# Patient Record
Sex: Male | Born: 1945 | Race: White | Hispanic: No | Marital: Married | Smoking: Former smoker
Health system: Southern US, Community
[De-identification: ages and names within clinical notes are randomized; demographics above are authoritative.]

## PROBLEM LIST (undated history)

## (undated) DIAGNOSIS — N189 Chronic kidney disease, unspecified: Secondary | ICD-10-CM

## (undated) DIAGNOSIS — E039 Hypothyroidism, unspecified: Secondary | ICD-10-CM

## (undated) DIAGNOSIS — N200 Calculus of kidney: Secondary | ICD-10-CM

## (undated) DIAGNOSIS — K644 Residual hemorrhoidal skin tags: Secondary | ICD-10-CM

## (undated) DIAGNOSIS — M549 Dorsalgia, unspecified: Secondary | ICD-10-CM

## (undated) DIAGNOSIS — E119 Type 2 diabetes mellitus without complications: Secondary | ICD-10-CM

## (undated) DIAGNOSIS — G8929 Other chronic pain: Secondary | ICD-10-CM

## (undated) DIAGNOSIS — I6529 Occlusion and stenosis of unspecified carotid artery: Secondary | ICD-10-CM

## (undated) DIAGNOSIS — E7800 Pure hypercholesterolemia, unspecified: Secondary | ICD-10-CM

## (undated) DIAGNOSIS — D126 Benign neoplasm of colon, unspecified: Secondary | ICD-10-CM

## (undated) DIAGNOSIS — I10 Essential (primary) hypertension: Secondary | ICD-10-CM

## (undated) DIAGNOSIS — F4310 Post-traumatic stress disorder, unspecified: Secondary | ICD-10-CM

## (undated) DIAGNOSIS — T7840XA Allergy, unspecified, initial encounter: Secondary | ICD-10-CM

## (undated) DIAGNOSIS — K219 Gastro-esophageal reflux disease without esophagitis: Secondary | ICD-10-CM

## (undated) DIAGNOSIS — M5136 Other intervertebral disc degeneration, lumbar region: Secondary | ICD-10-CM

## (undated) DIAGNOSIS — R000 Tachycardia, unspecified: Secondary | ICD-10-CM

## (undated) DIAGNOSIS — M51369 Other intervertebral disc degeneration, lumbar region without mention of lumbar back pain or lower extremity pain: Secondary | ICD-10-CM

## (undated) DIAGNOSIS — H8109 Meniere's disease, unspecified ear: Secondary | ICD-10-CM

## (undated) DIAGNOSIS — K635 Polyp of colon: Secondary | ICD-10-CM

## (undated) HISTORY — DX: Residual hemorrhoidal skin tags: K64.4

## (undated) HISTORY — DX: Tachycardia, unspecified: R00.0

## (undated) HISTORY — DX: Allergy, unspecified, initial encounter: T78.40XA

## (undated) HISTORY — DX: Polyp of colon: K63.5

## (undated) HISTORY — DX: Pure hypercholesterolemia, unspecified: E78.00

## (undated) HISTORY — DX: Benign neoplasm of colon, unspecified: D12.6

## (undated) HISTORY — PX: CHOLECYSTECTOMY: SHX55

## (undated) HISTORY — DX: Meniere's disease, unspecified ear: H81.09

## (undated) HISTORY — DX: Type 2 diabetes mellitus without complications: E11.9

## (undated) HISTORY — DX: Essential (primary) hypertension: I10

## (undated) HISTORY — PX: MOUTH SURGERY: SHX715

---

## 1963-01-23 DIAGNOSIS — J189 Pneumonia, unspecified organism: Secondary | ICD-10-CM

## 1963-01-23 HISTORY — DX: Pneumonia, unspecified organism: J18.9

## 1972-01-23 DIAGNOSIS — I499 Cardiac arrhythmia, unspecified: Secondary | ICD-10-CM | POA: Insufficient documentation

## 1998-01-22 DIAGNOSIS — H9190 Unspecified hearing loss, unspecified ear: Secondary | ICD-10-CM | POA: Insufficient documentation

## 2002-10-14 ENCOUNTER — Encounter: Admission: RE | Admit: 2002-10-14 | Discharge: 2002-10-14 | Payer: Self-pay | Admitting: *Deleted

## 2002-10-14 ENCOUNTER — Encounter: Payer: Self-pay | Admitting: *Deleted

## 2003-11-16 ENCOUNTER — Ambulatory Visit (HOSPITAL_COMMUNITY): Admission: RE | Admit: 2003-11-16 | Discharge: 2003-11-16 | Payer: Self-pay | Admitting: Internal Medicine

## 2003-11-16 HISTORY — PX: COLONOSCOPY: SHX174

## 2004-03-01 ENCOUNTER — Ambulatory Visit: Payer: Self-pay | Admitting: Orthopedic Surgery

## 2004-03-06 ENCOUNTER — Ambulatory Visit (HOSPITAL_COMMUNITY): Admission: RE | Admit: 2004-03-06 | Discharge: 2004-03-06 | Payer: Self-pay | Admitting: Orthopedic Surgery

## 2004-03-15 ENCOUNTER — Ambulatory Visit: Payer: Self-pay | Admitting: Orthopedic Surgery

## 2008-11-02 ENCOUNTER — Encounter (INDEPENDENT_AMBULATORY_CARE_PROVIDER_SITE_OTHER): Payer: Self-pay | Admitting: *Deleted

## 2008-11-22 DIAGNOSIS — K635 Polyp of colon: Secondary | ICD-10-CM

## 2008-11-22 DIAGNOSIS — D126 Benign neoplasm of colon, unspecified: Secondary | ICD-10-CM

## 2008-11-22 HISTORY — DX: Polyp of colon: K63.5

## 2008-11-22 HISTORY — DX: Benign neoplasm of colon, unspecified: D12.6

## 2008-11-24 ENCOUNTER — Encounter: Payer: Self-pay | Admitting: Internal Medicine

## 2008-12-10 ENCOUNTER — Ambulatory Visit: Payer: Self-pay | Admitting: Internal Medicine

## 2008-12-10 ENCOUNTER — Ambulatory Visit (HOSPITAL_COMMUNITY): Admission: RE | Admit: 2008-12-10 | Discharge: 2008-12-10 | Payer: Self-pay | Admitting: Internal Medicine

## 2008-12-10 HISTORY — PX: COLONOSCOPY: SHX174

## 2008-12-21 ENCOUNTER — Encounter: Payer: Self-pay | Admitting: Internal Medicine

## 2010-03-26 ENCOUNTER — Emergency Department (HOSPITAL_COMMUNITY): Payer: Medicare Other

## 2010-03-26 ENCOUNTER — Emergency Department (HOSPITAL_COMMUNITY)
Admission: EM | Admit: 2010-03-26 | Discharge: 2010-03-26 | Disposition: A | Discharge status: Home / Self Care | Payer: Medicare Other | Attending: Emergency Medicine | Admitting: Emergency Medicine

## 2010-03-26 DIAGNOSIS — R002 Palpitations: Secondary | ICD-10-CM | POA: Insufficient documentation

## 2010-03-26 DIAGNOSIS — R05 Cough: Secondary | ICD-10-CM | POA: Insufficient documentation

## 2010-03-26 DIAGNOSIS — E7800 Pure hypercholesterolemia, unspecified: Secondary | ICD-10-CM | POA: Insufficient documentation

## 2010-03-26 DIAGNOSIS — R1011 Right upper quadrant pain: Secondary | ICD-10-CM | POA: Insufficient documentation

## 2010-03-26 DIAGNOSIS — I10 Essential (primary) hypertension: Secondary | ICD-10-CM | POA: Insufficient documentation

## 2010-03-26 DIAGNOSIS — R0602 Shortness of breath: Secondary | ICD-10-CM | POA: Insufficient documentation

## 2010-03-26 DIAGNOSIS — R059 Cough, unspecified: Secondary | ICD-10-CM | POA: Insufficient documentation

## 2010-03-26 DIAGNOSIS — Z79899 Other long term (current) drug therapy: Secondary | ICD-10-CM | POA: Insufficient documentation

## 2010-03-26 LAB — COMPREHENSIVE METABOLIC PANEL
Albumin: 3.9 g/dL (ref 3.5–5.2)
BUN: 14 mg/dL (ref 6–23)
Calcium: 9.3 mg/dL (ref 8.4–10.5)
Creatinine, Ser: 1.18 mg/dL (ref 0.4–1.5)
Total Bilirubin: 1.4 mg/dL — ABNORMAL HIGH (ref 0.3–1.2)
Total Protein: 7.5 g/dL (ref 6.0–8.3)

## 2010-03-26 LAB — DIFFERENTIAL
Basophils Relative: 0 % (ref 0–1)
Eosinophils Absolute: 0.2 10*3/uL (ref 0.0–0.7)
Eosinophils Relative: 2 % (ref 0–5)
Lymphs Abs: 1.6 10*3/uL (ref 0.7–4.0)
Monocytes Relative: 9 % (ref 3–12)

## 2010-03-26 LAB — POCT CARDIAC MARKERS
CKMB, poc: 2.6 ng/mL (ref 1.0–8.0)
Myoglobin, poc: 190 ng/mL (ref 12–200)
Troponin i, poc: <0.05 ng/mL (ref 0.00–0.09)

## 2010-03-26 LAB — CBC
MCH: 30.2 pg (ref 26.0–34.0)
MCHC: 35.6 g/dL (ref 30.0–36.0)
MCV: 84.8 fL (ref 78.0–100.0)
Platelets: 252 10*3/uL (ref 150–400)
RDW: 12.7 % (ref 11.5–15.5)

## 2010-03-28 ENCOUNTER — Other Ambulatory Visit (HOSPITAL_COMMUNITY): Payer: Self-pay | Admitting: Family Medicine

## 2010-03-28 ENCOUNTER — Ambulatory Visit (HOSPITAL_COMMUNITY)
Admission: RE | Admit: 2010-03-28 | Discharge: 2010-03-28 | Disposition: A | Discharge status: Home / Self Care | Payer: Medicare Other | Source: Ambulatory Visit | Attending: Family Medicine | Admitting: Family Medicine

## 2010-03-28 DIAGNOSIS — R1011 Right upper quadrant pain: Secondary | ICD-10-CM | POA: Insufficient documentation

## 2010-03-28 DIAGNOSIS — K8020 Calculus of gallbladder without cholecystitis without obstruction: Secondary | ICD-10-CM | POA: Insufficient documentation

## 2010-03-28 DIAGNOSIS — K7689 Other specified diseases of liver: Secondary | ICD-10-CM | POA: Insufficient documentation

## 2010-03-30 ENCOUNTER — Encounter (HOSPITAL_COMMUNITY): Payer: Medicare Other

## 2010-03-30 ENCOUNTER — Other Ambulatory Visit: Payer: Self-pay | Admitting: General Surgery

## 2010-03-30 LAB — SURGICAL PCR SCREEN: Staphylococcus aureus: NEGATIVE

## 2010-03-31 ENCOUNTER — Ambulatory Visit (HOSPITAL_COMMUNITY)
Admission: RE | Admit: 2010-03-31 | Discharge: 2010-03-31 | Disposition: A | Discharge status: Home / Self Care | Payer: Medicare Other | Source: Ambulatory Visit | Attending: General Surgery | Admitting: General Surgery

## 2010-03-31 ENCOUNTER — Other Ambulatory Visit: Payer: Self-pay | Admitting: General Surgery

## 2010-03-31 DIAGNOSIS — Z01812 Encounter for preprocedural laboratory examination: Secondary | ICD-10-CM | POA: Insufficient documentation

## 2010-03-31 DIAGNOSIS — Z79899 Other long term (current) drug therapy: Secondary | ICD-10-CM | POA: Insufficient documentation

## 2010-03-31 DIAGNOSIS — I10 Essential (primary) hypertension: Secondary | ICD-10-CM | POA: Insufficient documentation

## 2010-03-31 DIAGNOSIS — Z01818 Encounter for other preprocedural examination: Secondary | ICD-10-CM | POA: Insufficient documentation

## 2010-03-31 DIAGNOSIS — Z7982 Long term (current) use of aspirin: Secondary | ICD-10-CM | POA: Insufficient documentation

## 2010-03-31 DIAGNOSIS — K8010 Calculus of gallbladder with chronic cholecystitis without obstruction: Secondary | ICD-10-CM | POA: Insufficient documentation

## 2010-04-02 NOTE — Op Note (Signed)
Kyle Dunlap, Kyle Dunlap            ACCOUNT NO.:  [id number]  MEDICAL RECORD NO.:  [id number]           PATIENT TYPE:  O  LOCATION:  DAYP                          FACILITY:  [facility]  PHYSICIAN:  Jamesetta So, M.D.  DATE OF BIRTH:  Jul 13, 1945  DATE OF PROCEDURE:  03/31/2010 DATE OF DISCHARGE:                              OPERATIVE REPORT   PREOPERATIVE DIAGNOSES:  Cholecystitis, cholelithiasis.  POSTOPERATIVE DIAGNOSES:  Cholecystitis, cholelithiasis.  PROCEDURE:  Laparoscopic cholecystectomy.  SURGEON:  Jamesetta So, MD  ANESTHESIA:  General endotracheal.  INDICATIONS:  The patient is a 65 year old white male who presents with cholecystitis secondary to cholelithiasis.  The risks and benefits of the procedure including bleeding, infection, hepatobiliary injury, and the possibility an open procedure were fully explained to the patient, I gave informed consent.  PROCEDURE NOTE:  The patient was placed in the supine position.  After induction of general endotracheal anesthesia, the abdomen was prepped and draped using the usual sterile technique with DuraPrep.  Surgical site confirmation was performed.  A supraumbilical incision was made down to the fascia.  A Veress needle was introduced into the abdominal cavity and confirmation of placement was done using the saline drop test.  The abdomen was then insufflated to 16 mmHg pressure.  An 11-mm trocar was introduced into the abdominal cavity under direct visualization without difficulty.  The patient was placed in reversed Trendelenburg position.  An additional 11-mm trocar was placed in the epigastric region and 5-mm trocar was placed in the right upper quadrant, right flank regions.  Liver was inspected and noted to be within normal limits.  The gallbladder was retracted superiorly and laterally.  The dissection was begun around the infundibulum of the gallbladder.  The cystic duct was first identified. The juncture to  the infundibulum was fully identified.  EndoClip was placed proximally and distally on the cystic duct, and the cystic duct was divided.  This likewise to the cystic artery.  The gallbladder was then freed away from the gallbladder fossa using Bovie electrocautery. The gallbladder was delivered through the epigastric trocar site using EndoCatch bag.  Gallbladder fossa was inspected.  No abnormal bleeding or bile leakage was noted.  Surgicel was placed in the gallbladder fossa.  All fluid and air were then evacuated from the abdominal cavity prior to removal of the trocars.  All wounds were irrigated with normal saline.  All wounds were injected with 0.5% Sensorcaine.  The epigastric fascia as well as supraumbilical fascia were reapproximated using 0 Vicryl interrupted sutures.  All skin incisions were closed using staples.  Betadine ointment and dry sterile dressings were applied.  All tape and needle counts were correct at the end of the procedure. The patient was extubated in the operating room and went back to recovery room awake in stable condition.  Complications none.  SPECIMEN:  Gallbladder.  BLOOD LOSS:  Minimal.     Jamesetta So, M.D.     MAJ/MEDQ  D:  03/31/2010  T:  03/31/2010  Job:  [id number]  cc:   Margaretmary Eddy, M.D. [id number]  Electronically Signed by Aviva Signs M.D. on 04/02/2010  09:21:57 PM

## 2010-04-04 NOTE — H&P (Addendum)
  Kyle Dunlap, REISMAN            ACCOUNT NO.:  [id number]  MEDICAL RECORD NO.:  [id number]           PATIENT TYPE:  O  LOCATION:  DOIB                          FACILITY:  [facility]  PHYSICIAN:  Jamesetta So, M.D.  DATE OF BIRTH:  1945/12/08  DATE OF ADMISSION:  03/30/2010 DATE OF DISCHARGE:  LH                             HISTORY & PHYSICAL   CHIEF COMPLAINT:  Cholecystitis, cholelithiasis.  HISTORY OF PRESENT ILLNESS:  The patient is a 65 year old white male who is referred for evaluation and treatment of biliary colic secondary to cholelithiasis.  He had an episode of right upper quadrant abdominal pain with radiation to the flank, nausea, and indigestion last week and requiring an emergency room visit.  It is made worse with fatty foods. No fever, chills, jaundice have been noted.  PAST MEDICAL HISTORY:  Includes hypertension, high cholesterol levels.  PAST SURGICAL HISTORY:  Oral surgery in the remote past.  CURRENT MEDICATIONS: 1. Diltiazem 240 mg p.o. daily. 2. Triamterene/hydrochlorothiazide 37.5/25 mg p.o. daily. 3. Simvastatin 10 mg p.o. daily. 4. Diazepam 2 mg p.o. p.r.n. 5. Pepcid over-the-counter. 6. Baby aspirin which he is holding. 7. Multivitamin.  ALLERGIES:  CODEINE.  REVIEW OF SYSTEMS:  The patient denies drinking or smoking.  He denies any other cardiopulmonary difficulties or bleeding disorders.  FAMILY MEDICAL HISTORY:  Noncontributory.  PHYSICAL EXAMINATION:  GENERAL:  The patient is a well-developed, well- nourished white male in no acute distress. HEENT:  No scleral icterus. LUNGS:  Clear to auscultation with equal breath sounds bilaterally. HEART:  Regular rate and rhythm without S3, S4, or murmurs. ABDOMEN:  Soft and nondistended.  He is tender in the right upper quadrant to palpation.  No hepatosplenomegaly, masses, hernias are identified.  DIAGNOSTIC STUDIES:  Ultrasound of the gallbladder reveals cholelithiasis with a normal common bile  duct.  IMPRESSION:  Cholecystitis, cholelithiasis.  PLAN:  The patient is scheduled for laparoscopic cholecystectomy on March 31, 2010.  The risks and benefits of the procedure including bleeding, infection, hepatobiliary injury and the possibility of an open procedure were fully explained to the patient, gave informed consent.     Jamesetta So, M.D.     MAJ/MEDQ  D:  03/30/2010  T:  03/30/2010  Job:  [id number]  cc:   [facility] at [facility], M.D. [id number]  Electronically Signed by Aviva Signs M.D. on 03/30/2010 09:53:57 PM

## 2010-06-09 NOTE — Op Note (Signed)
Kyle Dunlap, ROYES            ACCOUNT NO.:  [id number]   MEDICAL RECORD NO.:  [id number]          PATIENT TYPE:  AMB   LOCATION:  DAY                           FACILITY:  [facility]   PHYSICIAN:  Hildred Laser, M.D.    DATE OF BIRTH:  1945-03-16   DATE OF PROCEDURE:  11/16/2003  DATE OF DISCHARGE:                                 OPERATIVE REPORT   PROCEDURE:  Total colonoscopy with polypectomy.   INDICATIONS:  Abbe Amsterdam is a 65 year old Caucasian male who is here for screening  colonoscopy.  While he is free of GI symptoms, he has had invasive carcinoma  and a polyp.  He has been treated and is doing fine.  The procedure risks  were reviewed with the patient and informed consent was obtained.   PREMEDICATION:  Demerol 50 mg IV, Versed 6 mg IV.   FINDINGS:  Procedure performed in endoscopy suite.  The patient's vital  signs and O2 saturation were monitored during the procedure and remained  stable.  The patient was placed in the left lateral recumbent position and  rectal examination performed.  He had soft sentinel skin tags.  Digital exam  was normal.  The Olympus video scope was placed in the rectum and advanced  under vision into sigmoid colon and beyond.  Preparation was satisfactory.  There was a 5-6 mm polyp at the mid-transverse colon, which was snared and  retrieved for histologic examination.  There was another smaller polyp on a  fold at hepatic flexure, which was simply coagulated using snare tip.  The  scope was passed to the cecum, which was identified by the appendiceal  orifice and ileocecal valve.  Pictures taken for the record.  There was  either a small flat polyp or abnormal mucosa below appendiceal orifice.  Biopsy was taken for histology.  As the scope was withdrawn, the colonic  mucosa was once again carefully examined and no other abnormalities were  noted.  The rectal mucosa was normal.  The scope was retroflexed to examine  the anorectal junction, and small  hemorrhoids were noted below the dentate  line.  Endoscope was straightened and withdrawn.  The patient tolerated the  procedure well.   FINAL DIAGNOSES:  1.  Examination performed to the cecum.  2.  Three polyps treated.  One in the transverse colon 5-6 mm, which was      snared; one at the hepatic flexure was coagulated, and a small polyp      ablated by a cold biopsy at the cecum.  3.  Small external hemorrhoids.   RECOMMENDATIONS:  1.  Standard instructions given.  2.  I will be contacting the patient with biopsy results and further      recommendations.     Naje   NR/MEDQ  D:  11/16/2003  T:  11/16/2003  Job:  [id number]   cc:   Margaretmary Eddy, M.D.  [address] [place] [address]  Fax: [phone]

## 2011-01-23 DIAGNOSIS — Z87442 Personal history of urinary calculi: Secondary | ICD-10-CM

## 2011-01-23 HISTORY — DX: Personal history of urinary calculi: Z87.442

## 2011-07-13 ENCOUNTER — Ambulatory Visit: Payer: Medicare Other | Admitting: Internal Medicine

## 2011-08-03 ENCOUNTER — Encounter: Payer: Self-pay | Admitting: Internal Medicine

## 2011-08-03 ENCOUNTER — Ambulatory Visit (INDEPENDENT_AMBULATORY_CARE_PROVIDER_SITE_OTHER): Payer: Medicare Other | Admitting: Internal Medicine

## 2011-08-03 VITALS — BP 172/76 | HR 72 | Temp 97.6°F | Ht 69.5 in | Wt 204.6 lb

## 2011-08-03 DIAGNOSIS — R1310 Dysphagia, unspecified: Secondary | ICD-10-CM

## 2011-08-03 DIAGNOSIS — K219 Gastro-esophageal reflux disease without esophagitis: Secondary | ICD-10-CM

## 2011-08-03 DIAGNOSIS — Z860100 Personal history of colon polyps, unspecified: Secondary | ICD-10-CM

## 2011-08-03 DIAGNOSIS — K5900 Constipation, unspecified: Secondary | ICD-10-CM | POA: Insufficient documentation

## 2011-08-03 DIAGNOSIS — R109 Unspecified abdominal pain: Secondary | ICD-10-CM

## 2011-08-03 NOTE — Patient Instructions (Signed)
Schedule screening EGD - longstanding GERD  Continue Miralax daily as needed for constipation  Schedule Colonoscopy in 2015 as already planned

## 2011-08-03 NOTE — Progress Notes (Signed)
Primary Care Physician:  Kyle Battiest, MD Primary Gastroenterologist:  Dr. Gala Dunlap  Pre-Procedure History & Physical: HPI:  Kyle Dunlap is a 66 y.o. male here for further evaluation of recent right sided abdominal pain constipation. Patient notes insidious right-sided abdominal pain for the past few weeks temporally related to constipation. He notes worsening constipation over the past several months. May have a bowel movement  Every 4 to 5 days. Saw Dr. Wolfgang Dunlap 3 weeks ago. He prescribed MiraLax and referred him here. Taking MiraLax daily on as-needed basis has worked very well for him. This has facilitated bowel function and has been associated with complete resolution of his right-sided abdominal pain.  He has not had any melena or rectal bleeding. History of a couple small adenomas removed from his colon in 2010. He had some mild diverticulosis as well. He is slated to have a surveillance colonoscopy in 2015.  Intentional weight loss of 5 pounds recently to manage a mildly elevated blood glucose.  Patient tells me he's had acid reflux/heartburn symptoms every day for 40 years. Does not have any dysphagia. Takes over-the-counter H2 blockers daily for symptoms. He's never had evaluation of his upper GI tract.    Past Medical History  Diagnosis Date  . HTN (hypertension)   . High cholesterol   . Adenomatous colon polyp 11/2008    Dr. Gala Dunlap  . Hyperplastic colon polyp 11/2008    Dr. Gala Dunlap  . Hemorrhoids, external     Past Surgical History  Procedure Date  . Mouth surgery   . Cholecystectomy   . Colonoscopy 12/10/2008    Dr. Gala Dunlap- L side diverticula, adenomatous polyp, hyperplastic polyp  . Colonoscopy 11/16/2003    Dr. Laural Dunlap- performed exam to cecum,3 polyps- no path report available, external hemorrhoids,     Prior to Admission medications   Medication Sig Start Date End Date Taking? Authorizing Provider  aspirin 81 MG tablet Take 81 mg by mouth daily.   Yes Historical Provider,  MD  diazepam (VALIUM) 2 MG tablet Take 2 mg by mouth at bedtime as needed.  06/05/11  Yes Historical Provider, MD  diltiazem (TIAZAC) 360 MG 24 hr capsule Take 360 mg by mouth daily.  06/05/11  Yes Historical Provider, MD  famotidine (PEPCID AC) 10 MG chewable tablet Chew 10 mg by mouth 2 (two) times daily.   Yes Historical Provider, MD  fish oil-omega-3 fatty acids 1000 MG capsule Take 2 g by mouth daily.   Yes Historical Provider, MD  Multiple Vitamin (MULTIVITAMIN) capsule Take 1 capsule by mouth daily.   Yes Historical Provider, MD  polyethylene glycol (MIRALAX / GLYCOLAX) packet Take 17 g by mouth 2 (two) times a week.   Yes Historical Provider, MD  triamterene-hydrochlorothiazide (DYAZIDE) 37.5-25 MG per capsule Take 1 capsule by mouth daily.  06/05/11  Yes Historical Provider, MD    Allergies as of 08/03/2011 - Review Complete 08/03/2011  Allergen Reaction Noted  . Codeine Other (See Comments) 08/03/2011    No family history on file.  History   Social History  . Marital Status: Married    Spouse Name: N/A    Number of Children: N/A  . Years of Education: N/A   Occupational History  . Not on file.   Social History Main Topics  . Smoking status: Never Smoker   . Smokeless tobacco: Not on file  . Alcohol Use: No  . Drug Use: No  . Sexually Active: Not on file   Other Topics Concern  .  Not on file   Social History Narrative  . No narrative on file    Review of Systems: See HPI, otherwise negative ROS  Physical Exam: BP 172/76  Pulse 72  Temp 97.6 F (36.4 C) (Temporal)  Ht 5' 9.5" (1.765 m)  Wt 204 lb 9.6 oz (92.806 kg)  BMI 29.78 kg/m2 General:   Alert,  Well-developed, well-nourished, pleasant and cooperative in NAD Skin:  Intact without significant lesions or rashes. Eyes:  Sclera clear, no icterus.   Conjunctiva pink. Ears:  Normal auditory acuity. Nose:  No deformity, discharge,  or lesions. Mouth:  No deformity or lesions. Neck:  Supple; no masses or  thyromegaly. No significant cervical adenopathy. Lungs:  Clear throughout to auscultation.   No wheezes, crackles, or rhonchi. No acute distress. Heart:  Regular rate and rhythm; no murmurs, clicks, rubs,  or gallops. Abdomen: Non-distended, normal bowel sounds.  Soft and nontender without appreciable mass or hepatosplenomegaly.  Pulses:  Normal pulses noted. Extremities:  Without clubbing or edema. Rectal:  Good sphincter tone. No mass rectal vault. Scant brown stool is Hemoccult negative.   Impression/Plan:  Mr. Kyle Dunlap is a very pleasant 66 year old gentleman with recent nonspecific right-sided abdominal pain in a setting of progressive constipation. With successful use of MiraLax for his constipation, his abdominal pain have essentially resolved.  At this time,  I've recommended that he liberally use MiraLax. My specific recommendations for him would be to consider taking a dose of MiraLax every evening he does not have a bowel movement on any given day. He should do this indefinitely. I see no reason to move up the timing of his surveillance colonoscopy which is slated for 2015.  As a totally separate issue, I am struck by the fact that he has had daily heartburn symptoms for 40 years, taking over-the-counter H2 blockers on a daily basis.  Although there are no alarm symptoms referrable to his upper GI tract, this gentleman needs an EGD to further evaluate his long-standing reflux symptoms and to screen for complications. To this end, I have recommended a diagnostic EGD in the near future. The risks, benefits, limitations, alternatives and imponderables have been reviewed with the patient. Potential for esophageal dilation, biopsy, etc. have also been reviewed.  Questions have been answered. All parties agreeable.

## 2011-08-07 DIAGNOSIS — R1310 Dysphagia, unspecified: Secondary | ICD-10-CM | POA: Insufficient documentation

## 2011-08-07 MED ORDER — SODIUM CHLORIDE 0.45 % IV SOLN: Freq: Once | INTRAVENOUS | Status: DC

## 2011-08-07 NOTE — Progress Notes (Signed)
Per RMR- need to remove dysphagia from pts encounter dx. Pt did not have dysphagia. Talked with Rosendo Gros about this and it was removed from the pts encounter.

## 2011-08-07 NOTE — Addendum Note (Signed)
Addended by: Idamae Schuller on: 08/07/2011 03:00 PM   Modules accepted: Orders

## 2011-08-17 ENCOUNTER — Encounter (HOSPITAL_COMMUNITY): Payer: Self-pay | Admitting: Pharmacy Technician

## 2011-08-30 ENCOUNTER — Other Ambulatory Visit: Payer: Self-pay | Admitting: Internal Medicine

## 2011-08-30 MED ORDER — SODIUM CHLORIDE 0.45 % IV SOLN
Freq: Once | INTRAVENOUS | Status: AC
  Administered 2011-08-31: 09:00:00 via INTRAVENOUS

## 2011-08-31 ENCOUNTER — Ambulatory Visit (HOSPITAL_COMMUNITY)
Admission: RE | Admit: 2011-08-31 | Discharge: 2011-08-31 | Disposition: A | Discharge status: Home / Self Care | Payer: Medicare Other | Source: Ambulatory Visit | Attending: Internal Medicine | Admitting: Internal Medicine

## 2011-08-31 ENCOUNTER — Encounter (HOSPITAL_COMMUNITY): Payer: Self-pay | Admitting: *Deleted

## 2011-08-31 ENCOUNTER — Encounter (HOSPITAL_COMMUNITY)
Admission: RE | Disposition: A | Discharge status: Home / Self Care | Payer: Self-pay | Source: Ambulatory Visit | Attending: Internal Medicine

## 2011-08-31 DIAGNOSIS — R1310 Dysphagia, unspecified: Secondary | ICD-10-CM

## 2011-08-31 DIAGNOSIS — R12 Heartburn: Secondary | ICD-10-CM

## 2011-08-31 DIAGNOSIS — R109 Unspecified abdominal pain: Secondary | ICD-10-CM

## 2011-08-31 DIAGNOSIS — K2100 Gastro-esophageal reflux disease with esophagitis, without bleeding: Secondary | ICD-10-CM | POA: Insufficient documentation

## 2011-08-31 DIAGNOSIS — Z79899 Other long term (current) drug therapy: Secondary | ICD-10-CM | POA: Insufficient documentation

## 2011-08-31 DIAGNOSIS — E7800 Pure hypercholesterolemia, unspecified: Secondary | ICD-10-CM | POA: Insufficient documentation

## 2011-08-31 DIAGNOSIS — I10 Essential (primary) hypertension: Secondary | ICD-10-CM | POA: Insufficient documentation

## 2011-08-31 HISTORY — PX: ESOPHAGOGASTRODUODENOSCOPY: SHX5428

## 2011-08-31 SURGERY — EGD (ESOPHAGOGASTRODUODENOSCOPY)
Anesthesia: Moderate Sedation

## 2011-08-31 MED ORDER — MEPERIDINE HCL 100 MG/ML IJ SOLN
INTRAMUSCULAR | Status: DC | PRN
  Administered 2011-08-31: 25 mg via INTRAVENOUS
  Administered 2011-08-31: 50 mg via INTRAVENOUS

## 2011-08-31 MED ORDER — BUTAMBEN-TETRACAINE-BENZOCAINE 2-2-14 % EX AERO
INHALATION_SPRAY | CUTANEOUS | Status: DC | PRN
  Administered 2011-08-31: 2 via TOPICAL

## 2011-08-31 MED ORDER — MIDAZOLAM HCL 5 MG/5ML IJ SOLN
INTRAMUSCULAR | Status: AC
  Filled 2011-08-31: qty 10

## 2011-08-31 MED ORDER — MEPERIDINE HCL 100 MG/ML IJ SOLN
INTRAMUSCULAR | Status: AC
  Filled 2011-08-31: qty 1

## 2011-08-31 MED ORDER — DEXLANSOPRAZOLE 60 MG PO CPDR: 60.0000 mg | DELAYED_RELEASE_CAPSULE | Freq: Every day | ORAL | Status: DC

## 2011-08-31 MED ORDER — MIDAZOLAM HCL 5 MG/5ML IJ SOLN
INTRAMUSCULAR | Status: DC | PRN
  Administered 2011-08-31 (×2): 2 mg via INTRAVENOUS

## 2011-08-31 MED ORDER — STERILE WATER FOR IRRIGATION IR SOLN
Status: DC | PRN
  Administered 2011-08-31: 10:00:00

## 2011-08-31 NOTE — Op Note (Signed)
[facility] [address] [place], [place]  [address]  ENDOSCOPY PROCEDURE REPORT  PATIENT:  Kyle Dunlap, Kyle Dunlap  MR#:  [id number] BIRTHDATE:  [birth year], 26 yrs. old  GENDER:  male  ENDOSCOPIST:  R. Garfield Cornea, MD Quentin Ore Referred by:  Rosemary Holms, M.D.  PROCEDURE DATE:  08/31/2011 PROCEDURE:  diagnostic EGD  INDICATIONS:  lifelong daily heartburn  INFORMED CONSENT:   The risks, benefits, limitations, alternatives and imponderables have been discussed.  The potential for biopsy, esophogeal dilation, etc. have also been reviewed.  Questions have been answered.  All parties agreeable.  Please see the history and physical in the medical record for more information.  MEDICATIONS:  Versed 4 mg IV and Demerol 75 mg IVin divided doses. Cetacaine spray.  DESCRIPTION OF PROCEDURE:   The [id number] and [id number]) endoscope was introduced through the mouth and advanced to the second portion of the duodenum without difficulty or limitations.  The mucosal surfaces were surveyed very carefully during advancement of the scope and upon withdrawal.  Retroflexion view of the proximal stomach and esophagogastric junction was performed.  <<PROCEDUREIMAGES>>  FINDINGS:  Esophageal mucosal breaks within 5 mm the GE junction. No Barrett's esophagus. Patent tubular esophagus. Stomach empty. Normal gastric mucosa. No hiatal hernia. Normal first and second portion of the duodenum  THERAPEUTIC / DIAGNOSTIC MANEUVERS PERFORMED:    none  COMPLICATIONS:   None  IMPRESSION:  Distal esophageal erosions consistent with mild erosive reflux esophagitis  RECOMMENDATIONS:  Stop Pepcid. Begin Dexilant 60 mg orally daily. Office visit with [place] in 6 months.  ______________________________ R. Garfield Cornea, MD Quentin Ore  CC:  n. eSIGNED:   R. Garfield Cornea at 08/31/2011 10:12 AM  Veronia Beets, [id number]

## 2011-08-31 NOTE — Interval H&P Note (Signed)
History and Physical Interval Note:  08/31/2011 9:39 AM  Kyle Dunlap  has presented today for surgery, with the diagnosis of Dysphagia  The various methods of treatment have been discussed with the patient and family. After consideration of risks, benefits and other options for treatment, the patient has consented to  Procedure(s) (LRB): ESOPHAGOGASTRODUODENOSCOPY (EGD) (N/A) as a surgical intervention .  The patient's history has been reviewed, patient examined, no change in status, stable for surgery.  I have reviewed the patient's chart and labs.  Questions were answered to the patient's satisfaction.     Manus Rudd

## 2011-08-31 NOTE — H&P (View-Only) (Signed)
Primary Care Physician:  Kyle Battiest, MD Primary Gastroenterologist:  Dr. Gala Dunlap  Pre-Procedure History & Physical: HPI:  Kyle Dunlap is a 66 y.o. male here for further evaluation of recent right sided abdominal pain constipation. Patient notes insidious right-sided abdominal pain for the past few weeks temporally related to constipation. He notes worsening constipation over the past several months. May have a bowel movement  Every 4 to 5 days. Saw Dr. Wolfgang Dunlap 3 weeks ago. He prescribed MiraLax and referred him here. Taking MiraLax daily on as-needed basis has worked very well for him. This has facilitated bowel function and has been associated with complete resolution of his right-sided abdominal pain.  He has not had any melena or rectal bleeding. History of a couple small adenomas removed from his colon in 2010. He had some mild diverticulosis as well. He is slated to have a surveillance colonoscopy in 2015.  Intentional weight loss of 5 pounds recently to manage a mildly elevated blood glucose.  Patient tells me he's had acid reflux/heartburn symptoms every day for 40 years. Does not have any dysphagia. Takes over-the-counter H2 blockers daily for symptoms. He's never had evaluation of his upper GI tract.    Past Medical History  Diagnosis Date  . HTN (hypertension)   . High cholesterol   . Adenomatous colon polyp 11/2008    Dr. Gala Dunlap  . Hyperplastic colon polyp 11/2008    Dr. Gala Dunlap  . Hemorrhoids, external     Past Surgical History  Procedure Date  . Mouth surgery   . Cholecystectomy   . Colonoscopy 12/10/2008    Dr. Gala Dunlap- L side diverticula, adenomatous polyp, hyperplastic polyp  . Colonoscopy 11/16/2003    Dr. Laural Dunlap- performed exam to cecum,3 polyps- no path report available, external hemorrhoids,     Prior to Admission medications   Medication Sig Start Date End Date Taking? Authorizing Provider  aspirin 81 MG tablet Take 81 mg by mouth daily.   Yes Historical Provider,  MD  diazepam (VALIUM) 2 MG tablet Take 2 mg by mouth at bedtime as needed.  06/05/11  Yes Historical Provider, MD  diltiazem (TIAZAC) 360 MG 24 hr capsule Take 360 mg by mouth daily.  06/05/11  Yes Historical Provider, MD  famotidine (PEPCID AC) 10 MG chewable tablet Chew 10 mg by mouth 2 (two) times daily.   Yes Historical Provider, MD  fish oil-omega-3 fatty acids 1000 MG capsule Take 2 g by mouth daily.   Yes Historical Provider, MD  Multiple Vitamin (MULTIVITAMIN) capsule Take 1 capsule by mouth daily.   Yes Historical Provider, MD  polyethylene glycol (MIRALAX / GLYCOLAX) packet Take 17 g by mouth 2 (two) times a week.   Yes Historical Provider, MD  triamterene-hydrochlorothiazide (DYAZIDE) 37.5-25 MG per capsule Take 1 capsule by mouth daily.  06/05/11  Yes Historical Provider, MD    Allergies as of 08/03/2011 - Review Complete 08/03/2011  Allergen Reaction Noted  . Codeine Other (See Comments) 08/03/2011    No family history on file.  History   Social History  . Marital Status: Married    Spouse Name: N/A    Number of Children: N/A  . Years of Education: N/A   Occupational History  . Not on file.   Social History Main Topics  . Smoking status: Never Smoker   . Smokeless tobacco: Not on file  . Alcohol Use: No  . Drug Use: No  . Sexually Active: Not on file   Other Topics Concern  .  Not on file   Social History Narrative  . No narrative on file    Review of Systems: See HPI, otherwise negative ROS  Physical Exam: BP 172/76  Pulse 72  Temp 97.6 F (36.4 C) (Temporal)  Ht 5' 9.5" (1.765 m)  Wt 204 lb 9.6 oz (92.806 kg)  BMI 29.78 kg/m2 General:   Alert,  Well-developed, well-nourished, pleasant and cooperative in NAD Skin:  Intact without significant lesions or rashes. Eyes:  Sclera clear, no icterus.   Conjunctiva pink. Ears:  Normal auditory acuity. Nose:  No deformity, discharge,  or lesions. Mouth:  No deformity or lesions. Neck:  Supple; no masses or  thyromegaly. No significant cervical adenopathy. Lungs:  Clear throughout to auscultation.   No wheezes, crackles, or rhonchi. No acute distress. Heart:  Regular rate and rhythm; no murmurs, clicks, rubs,  or gallops. Abdomen: Non-distended, normal bowel sounds.  Soft and nontender without appreciable mass or hepatosplenomegaly.  Pulses:  Normal pulses noted. Extremities:  Without clubbing or edema. Rectal:  Good sphincter tone. No mass rectal vault. Scant brown stool is Hemoccult negative.   Impression/Plan:  Mr. Kyle Dunlap is a very pleasant 66 year old gentleman with recent nonspecific right-sided abdominal pain in a setting of progressive constipation. With successful use of MiraLax for his constipation, his abdominal pain have essentially resolved.  At this time,  I've recommended that he liberally use MiraLax. My specific recommendations for him would be to consider taking a dose of MiraLax every evening he does not have a bowel movement on any given day. He should do this indefinitely. I see no reason to move up the timing of his surveillance colonoscopy which is slated for 2015.  As a totally separate issue, I am struck by the fact that he has had daily heartburn symptoms for 40 years, taking over-the-counter H2 blockers on a daily basis.  Although there are no alarm symptoms referrable to his upper GI tract, this gentleman needs an EGD to further evaluate his long-standing reflux symptoms and to screen for complications. To this end, I have recommended a diagnostic EGD in the near future. The risks, benefits, limitations, alternatives and imponderables have been reviewed with the patient. Potential for esophageal dilation, biopsy, etc. have also been reviewed.  Questions have been answered. All parties agreeable.

## 2011-09-06 ENCOUNTER — Encounter (HOSPITAL_COMMUNITY): Payer: Self-pay | Admitting: Internal Medicine

## 2012-01-23 DIAGNOSIS — I10 Essential (primary) hypertension: Secondary | ICD-10-CM | POA: Insufficient documentation

## 2012-04-04 ENCOUNTER — Emergency Department (HOSPITAL_COMMUNITY): Payer: Medicare Other

## 2012-04-04 ENCOUNTER — Encounter (HOSPITAL_COMMUNITY): Payer: Self-pay | Admitting: Emergency Medicine

## 2012-04-04 ENCOUNTER — Emergency Department (HOSPITAL_COMMUNITY)
Admission: EM | Admit: 2012-04-04 | Discharge: 2012-04-04 | Disposition: A | Discharge status: Home / Self Care | Payer: Medicare Other | Attending: Emergency Medicine | Admitting: Emergency Medicine

## 2012-04-04 DIAGNOSIS — Z860100 Personal history of colon polyps, unspecified: Secondary | ICD-10-CM | POA: Insufficient documentation

## 2012-04-04 DIAGNOSIS — N201 Calculus of ureter: Secondary | ICD-10-CM | POA: Insufficient documentation

## 2012-04-04 DIAGNOSIS — Z8679 Personal history of other diseases of the circulatory system: Secondary | ICD-10-CM | POA: Insufficient documentation

## 2012-04-04 DIAGNOSIS — Z87442 Personal history of urinary calculi: Secondary | ICD-10-CM | POA: Insufficient documentation

## 2012-04-04 DIAGNOSIS — Z8639 Personal history of other endocrine, nutritional and metabolic disease: Secondary | ICD-10-CM | POA: Insufficient documentation

## 2012-04-04 DIAGNOSIS — Z79899 Other long term (current) drug therapy: Secondary | ICD-10-CM | POA: Insufficient documentation

## 2012-04-04 DIAGNOSIS — Z7982 Long term (current) use of aspirin: Secondary | ICD-10-CM | POA: Insufficient documentation

## 2012-04-04 DIAGNOSIS — N23 Unspecified renal colic: Secondary | ICD-10-CM

## 2012-04-04 DIAGNOSIS — Z8739 Personal history of other diseases of the musculoskeletal system and connective tissue: Secondary | ICD-10-CM | POA: Insufficient documentation

## 2012-04-04 DIAGNOSIS — G8929 Other chronic pain: Secondary | ICD-10-CM | POA: Insufficient documentation

## 2012-04-04 DIAGNOSIS — Z862 Personal history of diseases of the blood and blood-forming organs and certain disorders involving the immune mechanism: Secondary | ICD-10-CM | POA: Insufficient documentation

## 2012-04-04 DIAGNOSIS — I10 Essential (primary) hypertension: Secondary | ICD-10-CM | POA: Insufficient documentation

## 2012-04-04 HISTORY — DX: Dorsalgia, unspecified: M54.9

## 2012-04-04 HISTORY — DX: Other intervertebral disc degeneration, lumbar region: M51.36

## 2012-04-04 HISTORY — DX: Other intervertebral disc degeneration, lumbar region without mention of lumbar back pain or lower extremity pain: M51.369

## 2012-04-04 HISTORY — DX: Calculus of kidney: N20.0

## 2012-04-04 HISTORY — DX: Other chronic pain: G89.29

## 2012-04-04 LAB — URINALYSIS, ROUTINE W REFLEX MICROSCOPIC
Glucose, UA: 100 mg/dL — AB
Nitrite: POSITIVE — AB
Specific Gravity, Urine: >1.03 — ABNORMAL HIGH (ref 1.005–1.030)
pH: 6.5 (ref 5.0–8.0)

## 2012-04-04 MED ORDER — ONDANSETRON HCL 4 MG PO TABS: 4.0000 mg | ORAL_TABLET | Freq: Three times a day (TID) | ORAL | Status: DC | PRN

## 2012-04-04 MED ORDER — TAMSULOSIN HCL 0.4 MG PO CAPS: 0.4000 mg | ORAL_CAPSULE | Freq: Every day | ORAL | Status: DC

## 2012-04-04 MED ORDER — OXYCODONE-ACETAMINOPHEN 5-325 MG PO TABS
2.0000 | ORAL_TABLET | Freq: Once | ORAL | Status: AC
  Administered 2012-04-04: 2 via ORAL
  Filled 2012-04-04: qty 2

## 2012-04-04 MED ORDER — OXYCODONE-ACETAMINOPHEN 5-325 MG PO TABS: ORAL_TABLET | ORAL | Status: DC

## 2012-04-04 NOTE — ED Provider Notes (Signed)
History     CSN: [id number]  Arrival date & time 04/04/12  [id number]   First MD Initiated Contact with Patient 04/04/12 [phone]      Chief Complaint  Patient presents with  . Flank Pain     HPI Pt was seen at 0750.  Per pt, c/o sudden onset and persistence of constant right sided flank "pain" that began last night approx 2330.  Pt describes the pain as "like my last kidney stone," and radiating into the right side of his abd and groin.  Has been associated with "dark" urine today.  Denies testicular pain/swelling, no dysuria/hematuria, no abd pain, no N/V/D, no black or blood in emesis, no CP/SOB.     Past Medical History  Diagnosis Date  . HTN (hypertension)   . High cholesterol   . Adenomatous colon polyp 11/2008    Dr. Gala Romney  . Hyperplastic colon polyp 11/2008    Dr. Gala Romney  . Hemorrhoids, external   . DDD (degenerative disc disease), lumbar   . Chronic back pain   . Kidney stone     "14 years ago" per pt    Past Surgical History  Procedure Laterality Date  . Mouth surgery    . Cholecystectomy    . Colonoscopy  12/10/2008    Dr. Gala Romney- L side diverticula, adenomatous polyp, hyperplastic polyp  . Colonoscopy  11/16/2003    Dr. Laural Golden- performed exam to cecum,3 polyps- no path report available, external hemorrhoids,   . Esophagogastroduodenoscopy  08/31/2011    Procedure: ESOPHAGOGASTRODUODENOSCOPY (EGD);  Surgeon: Daneil Dolin, MD;  Location: [facility];  Service: Endoscopy;  Laterality: N/A;  9:15    History  Substance Use Topics  . Smoking status: Never Smoker   . Smokeless tobacco: Not on file  . Alcohol Use: No      Review of Systems ROS: Statement: All systems negative except as marked or noted in the HPI; Constitutional: Negative for fever and chills. ; ; Eyes: Negative for eye pain, redness and discharge. ; ; ENMT: Negative for ear pain, hoarseness, nasal congestion, sinus pressure and sore throat. ; ; Cardiovascular: Negative for chest pain, palpitations,  diaphoresis, dyspnea and peripheral edema. ; ; Respiratory: Negative for cough, wheezing and stridor. ; ; Gastrointestinal: Negative for nausea, vomiting, diarrhea, abdominal pain, blood in stool, hematemesis, jaundice and rectal bleeding. . ; ; Genitourinary: +flank pain. Negative for dysuria and hematuria. ; ; Genital:  No penile drainage or rash, no testicular pain or swelling, no scrotal rash or swelling.;; Musculoskeletal: Negative for back pain and neck pain. Negative for swelling and trauma.; ; Skin: Negative for pruritus, rash, abrasions, blisters, bruising and skin lesion.; ; Neuro: Negative for headache, lightheadedness and neck stiffness. Negative for weakness, altered level of consciousness , altered mental status, extremity weakness, paresthesias, involuntary movement, seizure and syncope.       Allergies  Codeine  Home Medications   Current Outpatient Rx  Name  Route  Sig  Dispense  Refill  . aspirin 81 MG tablet   Oral   Take 81 mg by mouth daily.         Marland Kitchen EXPIRED: dexlansoprazole (DEXILANT) 60 MG capsule   Oral   Take 1 capsule (60 mg total) by mouth daily.   30 capsule   11   . EXPIRED: dexlansoprazole (DEXILANT) 60 MG capsule   Oral   Take 1 capsule (60 mg total) by mouth daily.   21 capsule   0   . diazepam (  VALIUM) 2 MG tablet   Oral   Take 2 mg by mouth at bedtime as needed. Sleep         . diltiazem (TIAZAC) 360 MG 24 hr capsule   Oral   Take 360 mg by mouth daily.          . famotidine (PEPCID AC) 10 MG chewable tablet   Oral   Chew 10 mg by mouth 2 (two) times daily.         . fish oil-omega-3 fatty acids 1000 MG capsule   Oral   Take 2 g by mouth daily.         . Multiple Vitamin (MULTIVITAMIN WITH MINERALS) TABS   Oral   Take 1 tablet by mouth daily.         . polyethylene glycol (MIRALAX / GLYCOLAX) packet   Oral   Take 17 g by mouth daily as needed. Constipation         . triamterene-hydrochlorothiazide (DYAZIDE) 37.5-25  MG per capsule   Oral   Take 1 capsule by mouth daily.            BP 182/68  Pulse 77  Temp(Src) 97.6 F (36.4 C) (Oral)  Resp 16  Ht 5\' 9"  (1.753 m)  Wt 210 lb (95.255 kg)  BMI 31 kg/m2  SpO2 96%  Physical Exam 0755: Physical examination:  Nursing notes reviewed; Vital signs and O2 SAT reviewed;  Constitutional: Well developed, Well nourished, Well hydrated, In no acute distress; Head:  Normocephalic, atraumatic; Eyes: EOMI, PERRL, No scleral icterus; ENMT: Mouth and pharynx normal, Mucous membranes moist; Neck: Supple, Full range of motion, No lymphadenopathy; Cardiovascular: Regular rate and rhythm, No murmur, rub, or gallop; Respiratory: Breath sounds clear & equal bilaterally, No rales, rhonchi, wheezes.  Speaking full sentences with ease, Normal respiratory effort/excursion; Chest: Nontender, Movement normal; Abdomen: Soft, Nontender, Nondistended, Normal bowel sounds; Genitourinary: No CVA tenderness. Genital exam performed with pt permission and male ED Tech chaparone present during exam.  Pt examined laying and standing.  No perineal erythema.  No penile lesions or drainage.  No scrotal erythema, edema or tenderness to palp.  Normal testicular lie.  +mild right lower lateral testicular tenderness to palp.  +cremasteric reflexes bilat.  No inguinal LAN or palpable masses.;; Spine:  No midline CS, TS, LS tenderness.;;  Extremities: Pulses normal, No tenderness, No edema, No calf edema or asymmetry.; Neuro: AA&Ox3, Major CN grossly intact.  Speech clear. Climbs on and off stretcher easily by himself. Gait upright and steady. No gross focal motor or sensory deficits in extremities.; Skin: Color normal, Warm, Dry.   ED Course  Procedures    MDM  MDM Reviewed: previous chart, nursing note and vitals Interpretation: labs and CT scan   Results for orders placed during the hospital encounter of 04/04/12  URINALYSIS, ROUTINE W REFLEX MICROSCOPIC      Result Value Range   Color,  Urine RED (*) YELLOW   APPearance CLOUDY (*) CLEAR   Specific Gravity, Urine >1.030 (*) 1.005 - 1.030   pH 6.5  5.0 - 8.0   Glucose, UA 100 (*) NEGATIVE mg/dL   Hgb urine dipstick LARGE (*) NEGATIVE   Bilirubin Urine NEGATIVE  NEGATIVE   Ketones, ur 15 (*) NEGATIVE mg/dL   Protein, ur >300 (*) NEGATIVE mg/dL   Urobilinogen, UA 1.0  0.0 - 1.0 mg/dL   Nitrite POSITIVE (*) NEGATIVE   Leukocytes, UA SMALL (*) NEGATIVE  URINE MICROSCOPIC-ADD ON  Result Value Range   WBC, UA TOO NUMEROUS TO COUNT  <3 WBC/hpf   RBC / HPF TOO NUMEROUS TO COUNT  <3 RBC/hpf   Ct Abdomen Pelvis Wo Contrast 04/04/2012  *RADIOLOGY REPORT*  Clinical Data: Right flank pain.  History of renal calculi.  CT ABDOMEN AND PELVIS WITHOUT CONTRAST  Technique:  Multidetector CT imaging of the abdomen and pelvis was performed following the standard protocol without intravenous contrast.  Comparison: 03/28/2010  Findings: The visualized portion of the liver, spleen, pancreas, and adrenal glands appear unremarkable in noncontrast CT appearance.  Gallbladder surgically absent.  Right hydronephrosis noted with hydroureter extending down to a 2 mm right UVJ calculus.  There is also a 1-2 mm nonobstructive right mid kidney calculus.  On the left side, on the left side, there are several nonobstructive renal calculi including a 2 mm mid kidney calculus, a separate 1-2 mm mid kidney calculus, and a 2 mm lower pole calculus.  In addition, on image 56 of series 4 we demonstrate 1-2 mm calcification in the left proximal ureter, without associated hydronephrosis or hydroureter.  Urinary bladder unremarkable.  Bilateral perirenal stranding is symmetric and likely chronic.  No pathologic retroperitoneal or porta hepatis adenopathy is identified.  The appendix appears normal. No pathologic pelvic adenopathy is identified.  Vacuum disc phenomenon and loss of disc space noted at L4-5 and L5- S1.  There is prominent facet arthropathy at L4-5 causing  severe bilateral foraminal stenosis, and underlying disc osteophyte complex causes at least moderate central stenosis.  At L5-S1 there is mild foraminal stenosis due to spurring.  Bridging spurring of the left sacroiliac joint is present.  IMPRESSION:  1.  Mildly obstructive 2 mm right UVJ calculus. 2.  Nonobstructive 2 mm of left proximal ureteral calculus. 3.  Nonobstructive bilateral nephrolithiasis. 4.  Lower lumbar spondylosis and degenerative disc disease, causing prominent bilateral foraminal stenosis at L4-5.   Original Report Authenticated By: Van Clines, M.D.    US Scrotum 04/04/2012  *RADIOLOGY REPORT*  Clinical Data:  Right scrotal pain.  SCROTAL ULTRASOUND DOPPLER ULTRASOUND OF THE TESTICLES  Technique: Complete ultrasound examination of the testicles, epididymis, and other scrotal structures was performed.  Color and spectral Doppler ultrasound were also utilized to evaluate blood flow to the testicles.  Comparison:  None.  Findings:  Right testis:  Measures 3.7 x 2.3 x 2.8 cm and demonstrates normal, homogeneous echotexture.  Left testis:  Measures 3.6 x 2.3 x 2.8 cm demonstrates normal, homogeneous echotexture.  Right epididymis:  Epididymal cysts are noted.  No worrisome lesion.  Left epididymis:  Epididymal cysts are noted. No worrisome lesion  Hydrocele:  Small cysts are noted.  Varicocele:  None.  Pulsed Doppler interrogation of both testes demonstrates low resistance flow bilaterally.  IMPRESSION: No acute finding.  Small epididymal cysts are incidentally noted.   Original Report Authenticated By: Orlean Patten, M.D.     1030:  Pt feels better after pain meds and wants to go home now.  Dx and testing d/w pt and family.  Questions answered.  Verb understanding, agreeable to d/c home with outpt f/u.           Alfonzo Feller, DO 04/07/12 2247

## 2012-04-04 NOTE — ED Notes (Signed)
Right flank/ groin pain began last night at 2330. States urine was very dark this morning. Denies nausea. Hx of kidney stone in the past.

## 2012-04-04 NOTE — ED Notes (Signed)
EDP in prior to RN, see EDP assessment for further

## 2012-04-06 LAB — URINE CULTURE
Colony Count: NO GROWTH
Culture: NO GROWTH

## 2012-06-23 ENCOUNTER — Other Ambulatory Visit: Payer: Self-pay | Admitting: Family Medicine

## 2012-06-23 NOTE — Telephone Encounter (Signed)
Last seen? Need chart

## 2012-06-24 NOTE — Telephone Encounter (Signed)
Last seen 01/04/12

## 2012-06-24 NOTE — Telephone Encounter (Signed)
Ok times one. Must have ov before further

## 2012-06-30 ENCOUNTER — Other Ambulatory Visit: Payer: Self-pay | Admitting: Family Medicine

## 2012-07-11 ENCOUNTER — Encounter: Payer: Self-pay | Admitting: *Deleted

## 2012-07-14 ENCOUNTER — Ambulatory Visit (INDEPENDENT_AMBULATORY_CARE_PROVIDER_SITE_OTHER): Payer: Medicare Other | Admitting: Family Medicine

## 2012-07-14 ENCOUNTER — Encounter: Payer: Self-pay | Admitting: Family Medicine

## 2012-07-14 VITALS — BP 132/70 | HR 70 | Wt 206.0 lb

## 2012-07-14 DIAGNOSIS — I10 Essential (primary) hypertension: Secondary | ICD-10-CM

## 2012-07-14 DIAGNOSIS — E785 Hyperlipidemia, unspecified: Secondary | ICD-10-CM

## 2012-07-14 DIAGNOSIS — Z79899 Other long term (current) drug therapy: Secondary | ICD-10-CM

## 2012-07-14 DIAGNOSIS — E119 Type 2 diabetes mellitus without complications: Secondary | ICD-10-CM

## 2012-07-14 DIAGNOSIS — Z125 Encounter for screening for malignant neoplasm of prostate: Secondary | ICD-10-CM

## 2012-07-14 LAB — BASIC METABOLIC PANEL
CO2: 27 mEq/L (ref 19–32)
Chloride: 99 mEq/L (ref 96–112)
Glucose, Bld: 133 mg/dL — ABNORMAL HIGH (ref 70–99)
Potassium: 4.3 mEq/L (ref 3.5–5.3)
Sodium: 135 mEq/L (ref 135–145)

## 2012-07-14 MED ORDER — TRIAMTERENE-HCTZ 37.5-25 MG PO CAPS: 1.0000 | ORAL_CAPSULE | Freq: Every day | ORAL | Status: DC

## 2012-07-14 MED ORDER — DEXLANSOPRAZOLE 60 MG PO CPDR: 60.0000 mg | DELAYED_RELEASE_CAPSULE | Freq: Every day | ORAL | Status: DC

## 2012-07-14 MED ORDER — DILTIAZEM HCL ER BEADS 360 MG PO CP24: 360.0000 mg | ORAL_CAPSULE | Freq: Every day | ORAL | Status: DC

## 2012-07-14 NOTE — Progress Notes (Signed)
  Subjective:    Patient ID: Kyle Dunlap, male    DOB: [birth year], 67 y.o.   MRN: [id number]  Diabetes He presents for his follow-up diabetic visit. He has type 2 diabetes mellitus. His disease course has been worsening. There are no hypoglycemic associated symptoms. Pertinent negatives for hypoglycemia include no confusion. Pertinent negatives for diabetes include no blurred vision, no chest pain and no fatigue. There are no hypoglycemic complications. Symptoms are stable. Risk factors for coronary artery disease include hypertension, male sex and dyslipidemia. Current diabetic treatment includes diet. He is compliant with treatment most of the time. He has not had a previous visit with a dietician. He participates in exercise intermittently. His home blood glucose trend is increasing steadily. His breakfast blood glucose is taken between 7-8 am. His breakfast blood glucose range is generally 130-140 mg/dl. Eye exam is not current.    Results for orders placed in visit on 07/14/12  POCT GLYCOSYLATED HEMOGLOBIN (HGB A1C)      Result Value Range   Hemoglobin A1C 8.1     Meniere's disease suffering fr occas dizziness. Overall manageable.  Hyperlipidemia--could not tolerate pravochol. Brother takes [place], but pt prefers to hold off. Also on walnuts  Reflux much better.    Review of Systems  Constitutional: Negative for fatigue.  Eyes: Negative for blurred vision.  Cardiovascular: Negative for chest pain.  Psychiatric/Behavioral: Negative for confusion.       Objective:   Physical Exam  Alert no acute distress. HEENT normal. Vital stable. Lungs clear. Heart regular rate and rhythm. Feet sensation intact pulses good no significant edema.      Assessment & Plan:  Impression #1 type 2 diabetes suboptimum control. Patient disinclined to initiate medicines. #2 hyperlipidemia poor control. See last note we tried Pravachol once again patient simply could not take. He has complete  inability to take statins. #3 Mnire's disease fair control. #4 reflux clinically stable. Plan diet exercise discussed. Patient to maintain all medications. Appropriate blood work. Check every 6 months. WSL

## 2012-07-15 DIAGNOSIS — E785 Hyperlipidemia, unspecified: Secondary | ICD-10-CM | POA: Insufficient documentation

## 2012-07-15 DIAGNOSIS — I10 Essential (primary) hypertension: Secondary | ICD-10-CM | POA: Insufficient documentation

## 2012-07-15 DIAGNOSIS — E119 Type 2 diabetes mellitus without complications: Secondary | ICD-10-CM | POA: Insufficient documentation

## 2012-07-15 LAB — PSA, MEDICARE: PSA: 1.84 ng/mL (ref <=4.00)

## 2012-07-21 ENCOUNTER — Telehealth: Payer: Self-pay | Admitting: Family Medicine

## 2012-07-21 ENCOUNTER — Encounter: Payer: Self-pay | Admitting: Family Medicine

## 2012-07-21 NOTE — Telephone Encounter (Signed)
enc date 07/21/12 * results * letter went out 07/22/12

## 2012-07-28 ENCOUNTER — Ambulatory Visit: Payer: Medicare Other | Admitting: Family Medicine

## 2012-09-04 ENCOUNTER — Other Ambulatory Visit: Payer: Self-pay | Admitting: Family Medicine

## 2012-09-26 ENCOUNTER — Other Ambulatory Visit: Payer: Self-pay | Admitting: Family Medicine

## 2012-12-09 ENCOUNTER — Other Ambulatory Visit: Payer: Self-pay | Admitting: Family Medicine

## 2012-12-10 NOTE — Telephone Encounter (Signed)
Ok times one don't forget diab ck up next mo

## 2013-03-03 ENCOUNTER — Ambulatory Visit (INDEPENDENT_AMBULATORY_CARE_PROVIDER_SITE_OTHER): Payer: Medicare Other | Admitting: Family Medicine

## 2013-03-03 ENCOUNTER — Encounter: Payer: Self-pay | Admitting: Family Medicine

## 2013-03-03 VITALS — BP 128/82 | Ht 69.0 in | Wt 206.2 lb

## 2013-03-03 DIAGNOSIS — R6889 Other general symptoms and signs: Secondary | ICD-10-CM

## 2013-03-03 DIAGNOSIS — Z0000 Encounter for general adult medical examination without abnormal findings: Secondary | ICD-10-CM

## 2013-03-03 DIAGNOSIS — E119 Type 2 diabetes mellitus without complications: Secondary | ICD-10-CM

## 2013-03-03 DIAGNOSIS — Z0001 Encounter for general adult medical examination with abnormal findings: Secondary | ICD-10-CM

## 2013-03-03 DIAGNOSIS — E782 Mixed hyperlipidemia: Secondary | ICD-10-CM

## 2013-03-03 LAB — HEPATIC FUNCTION PANEL
ALT: 30 U/L (ref 0–53)
AST: 22 U/L (ref 0–37)
Albumin: 4.3 g/dL (ref 3.5–5.2)
Alkaline Phosphatase: 99 U/L (ref 39–117)
BILIRUBIN INDIRECT: 0.6 mg/dL (ref 0.2–1.2)
Bilirubin, Direct: 0.1 mg/dL (ref 0.0–0.3)
Total Bilirubin: 0.7 mg/dL (ref 0.2–1.2)
Total Protein: 7.3 g/dL (ref 6.0–8.3)

## 2013-03-03 LAB — LIPID PANEL
Cholesterol: 272 mg/dL — ABNORMAL HIGH (ref 0–200)
HDL: 47 mg/dL (ref >39)
LDL CALC: 182 mg/dL — AB (ref 0–99)
Total CHOL/HDL Ratio: 5.8 Ratio
Triglycerides: 214 mg/dL — ABNORMAL HIGH (ref <150)
VLDL: 43 mg/dL — ABNORMAL HIGH (ref 0–40)

## 2013-03-03 LAB — HEMOGLOBIN A1C
Hgb A1c MFr Bld: 7.5 % — ABNORMAL HIGH (ref <5.7)
Mean Plasma Glucose: 169 mg/dL — ABNORMAL HIGH (ref <117)

## 2013-03-03 MED ORDER — PANTOPRAZOLE SODIUM 40 MG PO TBEC: 40.0000 mg | DELAYED_RELEASE_TABLET | Freq: Every day | ORAL | Status: DC

## 2013-03-03 MED ORDER — TRIAMTERENE-HCTZ 37.5-25 MG PO CAPS: 1.0000 | ORAL_CAPSULE | Freq: Every day | ORAL | Status: DC

## 2013-03-03 MED ORDER — DILTIAZEM HCL ER BEADS 360 MG PO CP24: ORAL_CAPSULE | ORAL | Status: DC

## 2013-03-03 NOTE — Progress Notes (Signed)
   Subjective:    Patient ID: Kyle Dunlap, male    DOB: 05-26-1945, 68 y.o.   MRN: [id number]  HPI Patient arrives for a wellness exam.   Patient reports no falls in the last 6 months and passed the mini cog.  Not exercising much  Stays active in the shop, does wodwork  Mostly good with diet, most sugars between 120 and 135.  Dizziness is overall okay, changes somewhat with weather   Review of Systems  Constitutional: Negative for fever, activity change and appetite change.  HENT: Negative for congestion and rhinorrhea.   Eyes: Negative for discharge.  Respiratory: Negative for cough and wheezing.   Cardiovascular: Negative for chest pain.  Gastrointestinal: Negative for vomiting, abdominal pain and blood in stool.  Genitourinary: Negative for frequency and difficulty urinating.  Musculoskeletal: Negative for neck pain.  Skin: Negative for rash.  Allergic/Immunologic: Negative for environmental allergies and food allergies.  Neurological: Negative for weakness and headaches.  Psychiatric/Behavioral: Negative for agitation.       Objective:   Physical Exam  Vitals reviewed. Constitutional: He appears well-developed and well-nourished.  HENT:  Head: Normocephalic and atraumatic.  Right Ear: External ear normal.  Left Ear: External ear normal.  Nose: Nose normal.  Mouth/Throat: Oropharynx is clear and moist.  Eyes: EOM are normal. Pupils are equal, round, and reactive to light.  Neck: Normal range of motion. Neck supple. No thyromegaly present.  Cardiovascular: Normal rate, regular rhythm and normal heart sounds.   No murmur heard. Pulmonary/Chest: Effort normal and breath sounds normal. No respiratory distress. He has no wheezes.  Abdominal: Soft. Bowel sounds are normal. He exhibits no distension and no mass. There is no tenderness.  Genitourinary: Penis normal.  Musculoskeletal: Normal range of motion. He exhibits no edema.  Lymphadenopathy:    He has no  cervical adenopathy.  Neurological: He is alert. He exhibits normal muscle tone.  Skin: Skin is warm and dry. No erythema.  Psychiatric: He has a normal mood and affect. His behavior is normal. Judgment normal.          Assessment & Plan:  Impression 1 wellness exam #2 type 2 diabetes glucoses good. A1c pending. #3 hypertension good control. #4 hyperlipidemia. Plan advised to call patient said GI and set up followup colonoscopy. Shingles vaccine given. Appropriate blood work. Diet exercise discussed. Old excellent start protonic 6. Recheck in 6 months. WSL

## 2013-03-15 ENCOUNTER — Encounter: Payer: Self-pay | Admitting: Family Medicine

## 2013-04-01 ENCOUNTER — Other Ambulatory Visit: Payer: Self-pay | Admitting: *Deleted

## 2013-04-01 MED ORDER — PANTOPRAZOLE SODIUM 40 MG PO TBEC: 40.0000 mg | DELAYED_RELEASE_TABLET | Freq: Every day | ORAL | Status: DC

## 2013-04-16 ENCOUNTER — Other Ambulatory Visit: Payer: Self-pay | Admitting: Family Medicine

## 2013-04-23 ENCOUNTER — Other Ambulatory Visit: Payer: Self-pay | Admitting: Family Medicine

## 2013-04-24 NOTE — Telephone Encounter (Signed)
Ok plus five mo ref 

## 2013-09-24 ENCOUNTER — Other Ambulatory Visit: Payer: Self-pay | Admitting: Family Medicine

## 2013-10-28 ENCOUNTER — Other Ambulatory Visit: Payer: Self-pay | Admitting: Family Medicine

## 2013-11-02 ENCOUNTER — Encounter: Payer: Self-pay | Admitting: Internal Medicine

## 2013-12-03 ENCOUNTER — Encounter: Payer: Self-pay | Admitting: *Deleted

## 2013-12-09 ENCOUNTER — Other Ambulatory Visit: Payer: Self-pay

## 2013-12-09 ENCOUNTER — Encounter: Payer: Self-pay | Admitting: Internal Medicine

## 2013-12-09 ENCOUNTER — Ambulatory Visit (INDEPENDENT_AMBULATORY_CARE_PROVIDER_SITE_OTHER): Payer: Medicare Other | Admitting: Internal Medicine

## 2013-12-09 VITALS — BP 169/81 | HR 73 | Temp 99.0°F | Ht 69.5 in | Wt 214.0 lb

## 2013-12-09 DIAGNOSIS — D126 Benign neoplasm of colon, unspecified: Secondary | ICD-10-CM

## 2013-12-09 DIAGNOSIS — Z860101 Personal history of adenomatous and serrated colon polyps: Secondary | ICD-10-CM

## 2013-12-09 DIAGNOSIS — Z86010 Personal history of colonic polyps: Secondary | ICD-10-CM

## 2013-12-09 MED ORDER — PEG-KCL-NACL-NASULF-NA ASC-C 100 G PO SOLR: 1.0000 | Freq: Once | ORAL | Status: AC

## 2013-12-09 NOTE — Patient Instructions (Signed)
Schedule surveillance colonoscopy - hx of colonic adenoma  Split Movie Prep  Further recommendations to follow

## 2013-12-09 NOTE — Progress Notes (Signed)
Primary Care Physician:  Rubbie Battiest, MD Primary Gastroenterologist:  Dr. Gala Romney  Pre-Procedure History & Physical: HPI:  Kyle Dunlap is a 68 y.o. male here for history of colonic adenoma removed 2010. He's done very well since that time. Was having some constipation for those symptoms have resolved with diet alone. Has GERD with history of mild erosive reflux esophagitis on EGD a couple years ago. No Barrett's. Reflux well controlled on Protonix 40 mg daily. He is due for surveillance colonoscopy at this time. He has not had any significant intercurrent illnesses or surgery since I last saw him.  Past Medical History  Diagnosis Date  . HTN (hypertension)   . High cholesterol   . Adenomatous colon polyp 11/2008    Dr. Gala Romney  . Hyperplastic colon polyp 11/2008    Dr. Gala Romney  . Hemorrhoids, external   . Chronic back pain   . Kidney stone     "14 years ago" per pt  . Type 2 diabetes mellitus   . Tachyarrhythmia   . Meniere's disease   . Allergy   . DDD (degenerative disc disease), lumbar     Past Surgical History  Procedure Laterality Date  . Mouth surgery    . Cholecystectomy    . Colonoscopy  12/10/2008    Dr. Gala Romney- L side diverticula, adenomatous polyp, hyperplastic polyp  . Colonoscopy  11/16/2003    Dr. Laural Golden- performed exam to cecum,3 polyps- no path report available, external hemorrhoids,   . Esophagogastroduodenoscopy  08/31/2011    [id number] esophageal erosions consistent with mild erosive reflux esophagitis    Prior to Admission medications   Medication Sig Start Date End Date Taking? Authorizing Provider  acetaminophen (TYLENOL) 500 MG tablet Take 500 mg by mouth every 6 (six) hours as needed for pain.    Historical Provider, MD  aspirin 81 MG tablet Take 81 mg by mouth daily.    Historical Provider, MD  diazepam (VALIUM) 2 MG tablet TAKE ONE TABLET TWICE DAILY, MAY TAKE FOUR TIMES DAILY DURING DIZZINESS FLARES    Mikey Kirschner, MD  diltiazem (CARDIZEM  CD) 360 MG 24 hr capsule TAKE ONE CAPSULE BY MOUTH EVERY DAY 09/25/13   Mikey Kirschner, MD  ibuprofen (ADVIL,MOTRIN) 200 MG tablet Take 200 mg by mouth every 6 (six) hours as needed for pain.    Historical Provider, MD  Multiple Vitamin (MULTIVITAMIN WITH MINERALS) TABS Take 1 tablet by mouth daily.    Historical Provider, MD  pantoprazole (PROTONIX) 40 MG tablet TAKE 1 TABLET BY MOUTH DAILY 09/25/13   Mikey Kirschner, MD  polyethylene glycol [facility] / GLYCOLAX) packet Take 17 g by mouth daily as needed. Constipation    Historical Provider, MD  triamterene-hydrochlorothiazide (DYAZIDE) 37.5-25 MG per capsule Take 1 each (1 capsule total) by mouth daily. 03/03/13 03/03/14  Mikey Kirschner, MD  triamterene-hydrochlorothiazide (DYAZIDE) 37.5-25 MG per capsule TAKE ONE CAPSULE BY MOUTH EVERY DAY 10/28/13   Mikey Kirschner, MD    Allergies as of 12/09/2013 - Review Complete 07/14/2012  Allergen Reaction Noted  . Lipitor [atorvastatin]  07/11/2012  . Zetia [ezetimibe]  07/11/2012  . Zocor [simvastatin]  07/11/2012  . Codeine Other (See Comments) 08/03/2011    Family History  Problem Relation Age of Onset  . Heart attack Father     History   Social History  . Marital Status: Married    Spouse Name: N/A    Number of Children: N/A  . Years of Education:  N/A   Occupational History  . Not on file.   Social History Main Topics  . Smoking status: Former Research scientist (life sciences)  . Smokeless tobacco: Former Systems developer    Quit date: 07/14/1985  . Alcohol Use: No  . Drug Use: No  . Sexual Activity: Not on file   Other Topics Concern  . Not on file   Social History Narrative    Review of Systems: See HPI, otherwise negative ROS  Physical Exam: There were no vitals taken for this visit. General:   Alert,  Well-developed, well-nourished, pleasant and cooperative in NAD Skin:  Intact without significant lesions or rashes. Eyes:  Sclera clear, no icterus.   Conjunctiva pink. Ears:  Normal auditory  acuity. Nose:  No deformity, discharge,  or lesions. Mouth:  No deformity or lesions. Neck:  Supple; no masses or thyromegaly. No significant cervical adenopathy. Lungs:  Clear throughout to auscultation.   No wheezes, crackles, or rhonchi. No acute distress. Heart:  Regular rate and rhythm; no murmurs, clicks, rubs,  or gallops. Abdomen: Non-distended, normal bowel sounds.  Soft and nontender without appreciable mass or hepatosplenomegaly.  Pulses:  Normal pulses noted. Extremities:  Without clubbing or edema.  Impression: History of colonic adenoma; due for surveillance colonoscopy at this time.The risks, benefits, limitations, alternatives and imponderables have been reviewed with the patient. Questions have been answered. All parties are agreeable.   Recommendations:   Schedule surveillance colonoscopy - hx of colonic adenoma  Split Movie Prep  Further recommendations to follow    Notice: This dictation was prepared with Dragon dictation along with smaller phrase technology. Any transcriptional errors that result from this process are unintentional and may not be corrected upon review.

## 2013-12-23 ENCOUNTER — Other Ambulatory Visit: Payer: Self-pay | Admitting: Family Medicine

## 2014-01-04 ENCOUNTER — Encounter (HOSPITAL_COMMUNITY): Payer: Self-pay | Admitting: *Deleted

## 2014-01-04 ENCOUNTER — Ambulatory Visit (HOSPITAL_COMMUNITY)
Admission: RE | Admit: 2014-01-04 | Discharge: 2014-01-04 | Disposition: A | Discharge status: Home / Self Care | Payer: Medicare Other | Source: Ambulatory Visit | Attending: Internal Medicine | Admitting: Internal Medicine

## 2014-01-04 ENCOUNTER — Encounter (HOSPITAL_COMMUNITY)
Admission: RE | Disposition: A | Discharge status: Home / Self Care | Payer: Self-pay | Source: Ambulatory Visit | Attending: Internal Medicine

## 2014-01-04 DIAGNOSIS — E780 Pure hypercholesterolemia: Secondary | ICD-10-CM | POA: Insufficient documentation

## 2014-01-04 DIAGNOSIS — K5730 Diverticulosis of large intestine without perforation or abscess without bleeding: Secondary | ICD-10-CM | POA: Insufficient documentation

## 2014-01-04 DIAGNOSIS — K210 Gastro-esophageal reflux disease with esophagitis: Secondary | ICD-10-CM | POA: Diagnosis not present

## 2014-01-04 DIAGNOSIS — I10 Essential (primary) hypertension: Secondary | ICD-10-CM | POA: Insufficient documentation

## 2014-01-04 DIAGNOSIS — D123 Benign neoplasm of transverse colon: Secondary | ICD-10-CM | POA: Diagnosis not present

## 2014-01-04 DIAGNOSIS — Z1211 Encounter for screening for malignant neoplasm of colon: Secondary | ICD-10-CM | POA: Diagnosis present

## 2014-01-04 DIAGNOSIS — E119 Type 2 diabetes mellitus without complications: Secondary | ICD-10-CM | POA: Insufficient documentation

## 2014-01-04 DIAGNOSIS — Z800 Family history of malignant neoplasm of digestive organs: Secondary | ICD-10-CM | POA: Diagnosis not present

## 2014-01-04 DIAGNOSIS — Z7982 Long term (current) use of aspirin: Secondary | ICD-10-CM | POA: Diagnosis not present

## 2014-01-04 DIAGNOSIS — Z87891 Personal history of nicotine dependence: Secondary | ICD-10-CM | POA: Insufficient documentation

## 2014-01-04 DIAGNOSIS — D120 Benign neoplasm of cecum: Secondary | ICD-10-CM | POA: Insufficient documentation

## 2014-01-04 DIAGNOSIS — D122 Benign neoplasm of ascending colon: Secondary | ICD-10-CM | POA: Insufficient documentation

## 2014-01-04 DIAGNOSIS — Z86010 Personal history of colonic polyps: Secondary | ICD-10-CM | POA: Diagnosis not present

## 2014-01-04 HISTORY — PX: COLONOSCOPY: SHX5424

## 2014-01-04 LAB — GLUCOSE, CAPILLARY: Glucose-Capillary: 146 mg/dL — ABNORMAL HIGH (ref 70–99)

## 2014-01-04 SURGERY — COLONOSCOPY
Anesthesia: Moderate Sedation

## 2014-01-04 MED ORDER — SODIUM CHLORIDE 0.9 % IV SOLN
INTRAVENOUS | Status: DC
  Administered 2014-01-04: 08:00:00 via INTRAVENOUS

## 2014-01-04 MED ORDER — ONDANSETRON HCL 4 MG/2ML IJ SOLN
INTRAMUSCULAR | Status: AC
  Filled 2014-01-04: qty 2

## 2014-01-04 MED ORDER — STERILE WATER FOR IRRIGATION IR SOLN
Status: DC | PRN
  Administered 2014-01-04: 09:00:00

## 2014-01-04 MED ORDER — MEPERIDINE HCL 100 MG/ML IJ SOLN
INTRAMUSCULAR | Status: AC
  Filled 2014-01-04: qty 2

## 2014-01-04 MED ORDER — MIDAZOLAM HCL 5 MG/5ML IJ SOLN
INTRAMUSCULAR | Status: AC
  Filled 2014-01-04: qty 10

## 2014-01-04 MED ORDER — ONDANSETRON HCL 4 MG/2ML IJ SOLN
INTRAMUSCULAR | Status: DC | PRN
  Administered 2014-01-04: 4 mg via INTRAVENOUS

## 2014-01-04 MED ORDER — MIDAZOLAM HCL 5 MG/5ML IJ SOLN
INTRAMUSCULAR | Status: DC | PRN
  Administered 2014-01-04: 2 mg via INTRAVENOUS
  Administered 2014-01-04: 1 mg via INTRAVENOUS
  Administered 2014-01-04: 2 mg via INTRAVENOUS

## 2014-01-04 MED ORDER — MEPERIDINE HCL 100 MG/ML IJ SOLN
INTRAMUSCULAR | Status: DC | PRN
  Administered 2014-01-04: 50 mg via INTRAVENOUS
  Administered 2014-01-04: 25 mg via INTRAVENOUS

## 2014-01-04 NOTE — Op Note (Signed)
[facility] [address] [place], [address]   COLONOSCOPY PROCEDURE REPORT  PATIENT: Kyle, Dunlap  MR#: [id number] BIRTHDATE: 03/08/1945 , 1  yrs. old GENDER: male ENDOSCOPIST: R.  Garfield Cornea, MD FACP [facility] REFERRED [id number] Wolfgang Phoenix, M.D. PROCEDURE DATE:  01/24/2014 PROCEDURE:   Colonoscopy with ablation and Colonoscopy with snare polypectomy INDICATIONS:History of colonic adenoma; surveillance examination. ; positive family history of colon cancer in his brother MEDICATIONS: Versed 5 mg IV and Demerol 75 mg IV in divided doses. Zofran 4 mg IV. ASA CLASS:       Class II  CONSENT: The risks, benefits, alternatives and imponderables including but not limited to bleeding, perforation as well as the possibility of a missed lesion have been reviewed.  The potential for biopsy, lesion removal, etc. have also been discussed. Questions have been answered.  All parties agreeable.  Please see the history and physical in the medical record for more information.  DESCRIPTION OF PROCEDURE:   After the risks benefits and alternatives of the procedure were thoroughly explained, informed consent was obtained.  The digital rectal exam revealed no abnormalities of the rectum.   The [id number]  endoscope was introduced through the anus and advanced to the cecum, which was identified by both the appendix and ileocecal valve. No adverse events experienced.   The quality of the prep was adequate.  The instrument was then slowly withdrawn as the colon was fully examined.      COLON FINDINGS: Normal rectum.  Scattered left-sided diverticula. Multiple colonic polyps: (2) in the cecum; (3) in the ascending segment and (1) at the splenic flexure.  The largest was approximately 7 mm in the base of the cecum.  Multiple hot and cold snare polypectomies performed.  The smaller of the 2 cecal polyps was removed/ablated with the snare loop.  During hot  snare polypectomy of the larger cecal polyp,we got possibly a grounding alarm from the [place] unit.  This was noted as the polyp was being cut tthrough.  We may have had a little more burn at the polypectomy site than desired.  However, the polypectomy site was closely inspected and it looked okay.  I elected to go ahead and close the polypectomy crater with one instint clip.  This was done nicely without apparent complication.     .  Withdrawal time=17 minutes 0 seconds.  The scope was withdrawn and the procedure completed. COMPLICATIONS: There were no immediate complications.  ENDOSCOPIC IMPRESSION: Multiple colonic polyps removed as described above. Polyp ablation. Clip placed. Colonic diverticulosis.  RECOMMENDATIONS: Follow-up on pathology. No MRI until clip no longer present.  eSigned:  R. Garfield Cornea, MD Rosalita Chessman [facility] 24-Jan-2014 9:21 AM   cc:  CPT CODES: ICD CODES:  The ICD and CPT codes recommended by this software are interpretations from the data that the clinical staff has captured with the software.  The verification of the translation of this report to the ICD and CPT codes and modifiers is the sole responsibility of the health care institution and practicing physician where this report was generated.  [employer] will not be held responsible for the validity of the ICD and CPT codes included on this report.  AMA assumes no liability for data contained or not contained herein. CPT is a Designer, television/film set of the [employer].  PATIENT NAME:  Kyle, Dunlap MR#: [id number]

## 2014-01-04 NOTE — H&P (View-Only) (Signed)
Primary Care Physician:  Rubbie Battiest, MD Primary Gastroenterologist:  Dr. Gala Romney  Pre-Procedure History & Physical: HPI:  Kyle Dunlap is a 68 y.o. male here for history of colonic adenoma removed 2010. He's done very well since that time. Was having some constipation for those symptoms have resolved with diet alone. Has GERD with history of mild erosive reflux esophagitis on EGD a couple years ago. No Barrett's. Reflux well controlled on Protonix 40 mg daily. He is due for surveillance colonoscopy at this time. He has not had any significant intercurrent illnesses or surgery since I last saw him.  Past Medical History  Diagnosis Date  . HTN (hypertension)   . High cholesterol   . Adenomatous colon polyp 11/2008    Dr. Gala Romney  . Hyperplastic colon polyp 11/2008    Dr. Gala Romney  . Hemorrhoids, external   . Chronic back pain   . Kidney stone     "14 years ago" per pt  . Type 2 diabetes mellitus   . Tachyarrhythmia   . Meniere's disease   . Allergy   . DDD (degenerative disc disease), lumbar     Past Surgical History  Procedure Laterality Date  . Mouth surgery    . Cholecystectomy    . Colonoscopy  12/10/2008    Dr. Gala Romney- L side diverticula, adenomatous polyp, hyperplastic polyp  . Colonoscopy  11/16/2003    Dr. Laural Golden- performed exam to cecum,3 polyps- no path report available, external hemorrhoids,   . Esophagogastroduodenoscopy  08/31/2011    [id number] esophageal erosions consistent with mild erosive reflux esophagitis    Prior to Admission medications   Medication Sig Start Date End Date Taking? Authorizing Provider  acetaminophen (TYLENOL) 500 MG tablet Take 500 mg by mouth every 6 (six) hours as needed for pain.    Historical Provider, MD  aspirin 81 MG tablet Take 81 mg by mouth daily.    Historical Provider, MD  diazepam (VALIUM) 2 MG tablet TAKE ONE TABLET TWICE DAILY, MAY TAKE FOUR TIMES DAILY DURING DIZZINESS FLARES    Mikey Kirschner, MD  diltiazem (CARDIZEM  CD) 360 MG 24 hr capsule TAKE ONE CAPSULE BY MOUTH EVERY DAY 09/25/13   Mikey Kirschner, MD  ibuprofen (ADVIL,MOTRIN) 200 MG tablet Take 200 mg by mouth every 6 (six) hours as needed for pain.    Historical Provider, MD  Multiple Vitamin (MULTIVITAMIN WITH MINERALS) TABS Take 1 tablet by mouth daily.    Historical Provider, MD  pantoprazole (PROTONIX) 40 MG tablet TAKE 1 TABLET BY MOUTH DAILY 09/25/13   Mikey Kirschner, MD  polyethylene glycol [facility] / GLYCOLAX) packet Take 17 g by mouth daily as needed. Constipation    Historical Provider, MD  triamterene-hydrochlorothiazide (DYAZIDE) 37.5-25 MG per capsule Take 1 each (1 capsule total) by mouth daily. 03/03/13 03/03/14  Mikey Kirschner, MD  triamterene-hydrochlorothiazide (DYAZIDE) 37.5-25 MG per capsule TAKE ONE CAPSULE BY MOUTH EVERY DAY 10/28/13   Mikey Kirschner, MD    Allergies as of 12/09/2013 - Review Complete 07/14/2012  Allergen Reaction Noted  . Lipitor [atorvastatin]  07/11/2012  . Zetia [ezetimibe]  07/11/2012  . Zocor [simvastatin]  07/11/2012  . Codeine Other (See Comments) 08/03/2011    Family History  Problem Relation Age of Onset  . Heart attack Father     History   Social History  . Marital Status: Married    Spouse Name: N/A    Number of Children: N/A  . Years of Education:  N/A   Occupational History  . Not on file.   Social History Main Topics  . Smoking status: Former Research scientist (life sciences)  . Smokeless tobacco: Former Systems developer    Quit date: 07/14/1985  . Alcohol Use: No  . Drug Use: No  . Sexual Activity: Not on file   Other Topics Concern  . Not on file   Social History Narrative    Review of Systems: See HPI, otherwise negative ROS  Physical Exam: There were no vitals taken for this visit. General:   Alert,  Well-developed, well-nourished, pleasant and cooperative in NAD Skin:  Intact without significant lesions or rashes. Eyes:  Sclera clear, no icterus.   Conjunctiva pink. Ears:  Normal auditory  acuity. Nose:  No deformity, discharge,  or lesions. Mouth:  No deformity or lesions. Neck:  Supple; no masses or thyromegaly. No significant cervical adenopathy. Lungs:  Clear throughout to auscultation.   No wheezes, crackles, or rhonchi. No acute distress. Heart:  Regular rate and rhythm; no murmurs, clicks, rubs,  or gallops. Abdomen: Non-distended, normal bowel sounds.  Soft and nontender without appreciable mass or hepatosplenomegaly.  Pulses:  Normal pulses noted. Extremities:  Without clubbing or edema.  Impression: History of colonic adenoma; due for surveillance colonoscopy at this time.The risks, benefits, limitations, alternatives and imponderables have been reviewed with the patient. Questions have been answered. All parties are agreeable.   Recommendations:   Schedule surveillance colonoscopy - hx of colonic adenoma  Split Movie Prep  Further recommendations to follow    Notice: This dictation was prepared with Dragon dictation along with smaller phrase technology. Any transcriptional errors that result from this process are unintentional and may not be corrected upon review.

## 2014-01-04 NOTE — Interval H&P Note (Signed)
History and Physical Interval Note:  01/04/2014 8:31 AM  Kyle Dunlap  has presented today for surgery, with the diagnosis of hx of colonic adenoma  The various methods of treatment have been discussed with the patient and family. After consideration of risks, benefits and other options for treatment, the patient has consented to  Procedure(s) with comments: COLONOSCOPY (N/A) - 8:30am as a surgical intervention .  The patient's history has been reviewed, patient examined, no change in status, stable for surgery.  I have reviewed the patient's chart and labs.  Questions were answered to the patient's satisfaction.     Kyle Dunlap  No change. Colonoscopy per plan.The risks, benefits, limitations, alternatives and imponderables have been reviewed with the patient. Questions have been answered. All parties are agreeable.

## 2014-01-04 NOTE — Discharge Instructions (Signed)
Colonoscopy Discharge Instructions  Read the instructions outlined below and refer to this sheet in the next few weeks. These discharge instructions provide you with general information on caring for yourself after you leave the hospital. Your doctor may also give you specific instructions. While your treatment has been planned according to the most current medical practices available, unavoidable complications occasionally occur. If you have any problems or questions after discharge, call Dr. Gala Romney at [phone]. ACTIVITY  You may resume your regular activity, but move at a slower pace for the next 24 hours.   Take frequent rest periods for the next 24 hours.   Walking will help get rid of the air and reduce the bloated feeling in your belly (abdomen).   No driving for 24 hours (because of the medicine (anesthesia) used during the test).    Do not sign any important legal documents or operate any machinery for 24 hours (because of the anesthesia used during the test).  NUTRITION  Drink plenty of fluids.   You may resume your normal diet as instructed by your doctor.   Begin with a light meal and progress to your normal diet. Heavy or fried foods are harder to digest and may make you feel sick to your stomach (nauseated).   Avoid alcoholic beverages for 24 hours or as instructed.  MEDICATIONS  You may resume your normal medications unless your doctor tells you otherwise.  WHAT YOU CAN EXPECT TODAY  Some feelings of bloating in the abdomen.   Passage of more gas than usual.   Spotting of blood in your stool or on the toilet paper.  IF YOU HAD POLYPS REMOVED DURING THE COLONOSCOPY:  No aspirin products for 7 days or as instructed.   No alcohol for 7 days or as instructed.   Eat a soft diet for the next 24 hours.  FINDING OUT THE RESULTS OF YOUR TEST Not all test results are available during your visit. If your test results are not back during the visit, make an appointment  with your caregiver to find out the results. Do not assume everything is normal if you have not heard from your caregiver or the medical facility. It is important for you to follow up on all of your test results.  SEEK IMMEDIATE MEDICAL ATTENTION IF:  You have more than a spotting of blood in your stool.   Your belly is swollen (abdominal distention).   You are nauseated or vomiting.   You have a temperature over 101.   You have abdominal pain or discomfort that is severe or gets worse throughout the day.     No MRI until clips gone  Polyp and diverticulosis information provided  Further recommendations to follow pending review of pathology report  Colon Polyps Polyps are lumps of extra tissue growing inside the body. Polyps can grow in the large intestine (colon). Most colon polyps are noncancerous (benign). However, some colon polyps can become cancerous over time. Polyps that are larger than a pea may be harmful. To be safe, caregivers remove and test all polyps. CAUSES  Polyps form when mutations in the genes cause your cells to grow and divide even though no more tissue is needed. RISK FACTORS There are a number of risk factors that can increase your chances of getting colon polyps. They include:  Being older than 50 years.  Family history of colon polyps or colon cancer.  Long-term colon diseases, such as colitis or Crohn disease.  Being overweight.  Smoking.  Being inactive.  Drinking too much alcohol. SYMPTOMS  Most small polyps do not cause symptoms. If symptoms are present, they may include:  Blood in the stool. The stool may look dark red or black.  Constipation or diarrhea that lasts longer than 1 week. DIAGNOSIS People often do not know they have polyps until their caregiver finds them during a regular checkup. Your caregiver can use 4 tests to check for polyps:  Digital rectal exam. The caregiver wears gloves and feels inside the rectum. This test would  find polyps only in the rectum.  Barium enema. The caregiver puts a liquid called barium into your rectum before taking X-rays of your colon. Barium makes your colon look white. Polyps are dark, so they are easy to see in the X-ray pictures.  Sigmoidoscopy. A thin, flexible tube (sigmoidoscope) is placed into your rectum. The sigmoidoscope has a light and tiny camera in it. The caregiver uses the sigmoidoscope to look at the last third of your colon.  Colonoscopy. This test is like sigmoidoscopy, but the caregiver looks at the entire colon. This is the most common method for finding and removing polyps. TREATMENT  Any polyps will be removed during a sigmoidoscopy or colonoscopy. The polyps are then tested for cancer. PREVENTION  To help lower your risk of getting more colon polyps:  Eat plenty of fruits and vegetables. Avoid eating fatty foods.  Do not smoke.  Avoid drinking alcohol.  Exercise every day.  Lose weight if recommended by your caregiver.  Eat plenty of calcium and folate. Foods that are rich in calcium include milk, cheese, and broccoli. Foods that are rich in folate include chickpeas, kidney beans, and spinach. HOME CARE INSTRUCTIONS Keep all follow-up appointments as directed by your caregiver. You may need periodic exams to check for polyps. SEEK MEDICAL CARE IF: You notice bleeding during a bowel movement. Document Released: 10/05/2003 Document Revised: 04/02/2011 Document Reviewed: 03/20/2011 [facility] Patient Information 2015 [place], [place]. This information is not intended to replace advice given to you by your health care provider. Make sure you discuss any questions you have with your health care provider.  Diverticulosis Diverticulosis is the condition that develops when small pouches (diverticula) form in the wall of your colon. Your colon, or large intestine, is where water is absorbed and stool is formed. The pouches form when the inside layer of your colon  pushes through weak spots in the outer layers of your colon. CAUSES  No one knows exactly what causes diverticulosis. RISK FACTORS  Being older than 87. Your risk for this condition increases with age. Diverticulosis is rare in people younger than 40 years. By age 21, almost everyone has it.  Eating a low-fiber diet.  Being frequently constipated.  Being overweight.  Not getting enough exercise.  Smoking.  Taking over-the-counter pain medicines, like aspirin and ibuprofen. SYMPTOMS  Most people with diverticulosis do not have symptoms. DIAGNOSIS  Because diverticulosis often has no symptoms, health care providers often discover the condition during an exam for other colon problems. In many cases, a health care provider will diagnose diverticulosis while using a flexible scope to examine the colon (colonoscopy). TREATMENT  If you have never developed an infection related to diverticulosis, you may not need treatment. If you have had an infection before, treatment may include:  Eating more fruits, vegetables, and grains.  Taking a fiber supplement.  Taking a live bacteria supplement (probiotic).  Taking medicine to relax your colon. HOME CARE INSTRUCTIONS   Drink at  least 6-8 glasses of water each day to prevent constipation.  Try not to strain when you have a bowel movement.  Keep all follow-up appointments. If you have had an infection before:  Increase the fiber in your diet as directed by your health care provider or dietitian.  Take a dietary fiber supplement if your health care provider approves.  Only take medicines as directed by your health care provider. SEEK MEDICAL CARE IF:   You have abdominal pain.  You have bloating.  You have cramps.  You have not gone to the bathroom in 3 days. SEEK IMMEDIATE MEDICAL CARE IF:   Your pain gets worse.  Yourbloating becomes very bad.  You have a fever or chills, and your symptoms suddenly get worse.  You  begin vomiting.  You have bowel movements that are bloody or black. MAKE SURE YOU:  Understand these instructions.  Will watch your condition.  Will get help right away if you are not doing well or get worse. Document Released: 10/06/2003 Document Revised: 01/13/2013 Document Reviewed: 12/03/2012 [facility] Patient Information 2015 [place], [place]. This information is not intended to replace advice given to you by your health care provider. Make sure you discuss any questions you have with your health care provider.

## 2014-01-06 ENCOUNTER — Encounter: Payer: Self-pay | Admitting: Internal Medicine

## 2014-01-06 ENCOUNTER — Encounter (HOSPITAL_COMMUNITY): Payer: Self-pay | Admitting: Internal Medicine

## 2014-01-08 ENCOUNTER — Other Ambulatory Visit: Payer: Self-pay | Admitting: Family Medicine

## 2014-01-08 NOTE — Telephone Encounter (Signed)
One mo worth, ovby mid [place]

## 2014-01-12 LAB — HM DIABETES EYE EXAM

## 2014-01-21 ENCOUNTER — Other Ambulatory Visit: Payer: Self-pay | Admitting: Family Medicine

## 2014-01-22 DIAGNOSIS — E119 Type 2 diabetes mellitus without complications: Secondary | ICD-10-CM | POA: Insufficient documentation

## 2014-02-18 ENCOUNTER — Other Ambulatory Visit: Payer: Self-pay | Admitting: Family Medicine

## 2014-02-25 ENCOUNTER — Other Ambulatory Visit: Payer: Self-pay | Admitting: Family Medicine

## 2014-03-03 ENCOUNTER — Other Ambulatory Visit: Payer: Self-pay | Admitting: Family Medicine

## 2014-03-08 ENCOUNTER — Encounter: Payer: Medicare Other | Admitting: Family Medicine

## 2014-03-09 ENCOUNTER — Other Ambulatory Visit: Payer: Self-pay | Admitting: Family Medicine

## 2014-03-15 ENCOUNTER — Ambulatory Visit (INDEPENDENT_AMBULATORY_CARE_PROVIDER_SITE_OTHER): Payer: BLUE CROSS/BLUE SHIELD | Admitting: Family Medicine

## 2014-03-15 ENCOUNTER — Encounter: Payer: Self-pay | Admitting: Family Medicine

## 2014-03-15 VITALS — BP 150/90 | Ht 70.0 in | Wt 216.0 lb

## 2014-03-15 DIAGNOSIS — Z23 Encounter for immunization: Secondary | ICD-10-CM

## 2014-03-15 DIAGNOSIS — E785 Hyperlipidemia, unspecified: Secondary | ICD-10-CM

## 2014-03-15 DIAGNOSIS — Z79899 Other long term (current) drug therapy: Secondary | ICD-10-CM | POA: Diagnosis not present

## 2014-03-15 DIAGNOSIS — E119 Type 2 diabetes mellitus without complications: Secondary | ICD-10-CM

## 2014-03-15 DIAGNOSIS — Z0000 Encounter for general adult medical examination without abnormal findings: Secondary | ICD-10-CM | POA: Diagnosis not present

## 2014-03-15 DIAGNOSIS — I10 Essential (primary) hypertension: Secondary | ICD-10-CM

## 2014-03-15 DIAGNOSIS — Z125 Encounter for screening for malignant neoplasm of prostate: Secondary | ICD-10-CM | POA: Diagnosis not present

## 2014-03-15 LAB — BASIC METABOLIC PANEL
BUN: 21 mg/dL (ref 6–23)
CHLORIDE: 99 meq/L (ref 96–112)
CO2: 26 mEq/L (ref 19–32)
Calcium: 9.6 mg/dL (ref 8.4–10.5)
Creat: 1.22 mg/dL (ref 0.50–1.35)
GLUCOSE: 182 mg/dL — AB (ref 70–99)
Potassium: 4.4 mEq/L (ref 3.5–5.3)
SODIUM: 137 meq/L (ref 135–145)

## 2014-03-15 LAB — HEPATIC FUNCTION PANEL
ALK PHOS: 100 U/L (ref 39–117)
ALT: 33 U/L (ref 0–53)
AST: 21 U/L (ref 0–37)
Albumin: 4.1 g/dL (ref 3.5–5.2)
BILIRUBIN DIRECT: 0.1 mg/dL (ref 0.0–0.3)
BILIRUBIN INDIRECT: 0.6 mg/dL (ref 0.2–1.2)
Total Bilirubin: 0.7 mg/dL (ref 0.2–1.2)
Total Protein: 7.3 g/dL (ref 6.0–8.3)

## 2014-03-15 LAB — HEMOGLOBIN A1C
HEMOGLOBIN A1C: 8.2 % — AB (ref <5.7)
Mean Plasma Glucose: 189 mg/dL — ABNORMAL HIGH (ref <117)

## 2014-03-15 LAB — LIPID PANEL
Cholesterol: 286 mg/dL — ABNORMAL HIGH (ref 0–200)
HDL: 39 mg/dL — ABNORMAL LOW (ref >=40)
LDL Cholesterol: 202 mg/dL — ABNORMAL HIGH (ref 0–99)
Total CHOL/HDL Ratio: 7.3 Ratio
Triglycerides: 223 mg/dL — ABNORMAL HIGH (ref <150)
VLDL: 45 mg/dL — AB (ref 0–40)

## 2014-03-15 MED ORDER — PANTOPRAZOLE SODIUM 40 MG PO TBEC: 40.0000 mg | DELAYED_RELEASE_TABLET | Freq: Every day | ORAL | Status: DC

## 2014-03-15 MED ORDER — TRIAMTERENE-HCTZ 37.5-25 MG PO CAPS: 1.0000 | ORAL_CAPSULE | Freq: Every day | ORAL | Status: DC

## 2014-03-15 MED ORDER — DIAZEPAM 2 MG PO TABS: ORAL_TABLET | ORAL | Status: DC

## 2014-03-15 NOTE — Progress Notes (Signed)
   Subjective:    Patient ID: Kyle Dunlap, male    DOB: [birth year], 69 y.o.   MRN: [id number]  HPI AWV- Annual Wellness Visit  The patient was seen for their annual wellness visit. The patient's past medical history, surgical history, and family history were reviewed. Pertinent vaccines were reviewed ( tetanus, pneumonia, shingles, flu) The patient's medication list was reviewed and updated.  The height and weight were entered. The patient's current BMI is: 30  Cognitive screening was completed. Outcome of Mini - Cog: Pss  Falls within the past 6 months:No  Current tobacco usage: No (All patients who use tobacco were given written and verbal information on quitting)  Recent listing of emergency department/hospitalizations over the past year were reviewed.  current specialist the patient sees on a regular basis: No   Medicare annual wellness visit patient questionnaire was reviewed.  A written screening schedule for the patient for the next 5-10 years was given. Appropriate discussion of followup regarding next visit was discussed.   Remarkably the patient is not checking his own sugars at all. Trying to watch his diet. Compliant with exercise mostly.   Mnire's disease is stable as long as he remains on the medications. No substantial dizziness.   Claims compliance with blood pressure medicines. Does not check blood pressure elsewhere. Watching salt intake.  Compliant with lipid medicines also.      Review of Systems  Constitutional: Negative for fever, activity change and appetite change.  HENT: Negative for congestion and rhinorrhea.   Eyes: Negative for discharge.  Respiratory: Negative for cough and wheezing.   Cardiovascular: Negative for chest pain.  Gastrointestinal: Negative for vomiting, abdominal pain and blood in stool.  Genitourinary: Negative for frequency and difficulty urinating.  Musculoskeletal: Negative for neck pain.  Skin: Negative for rash.   Allergic/Immunologic: Negative for environmental allergies and food allergies.  Neurological: Negative for weakness and headaches.  Psychiatric/Behavioral: Negative for agitation.  All other systems reviewed and are negative.      Objective:   Physical Exam  Constitutional: He appears well-developed and well-nourished.  HENT:  Head: Normocephalic and atraumatic.  Right Ear: External ear normal.  Left Ear: External ear normal.  Nose: Nose normal.  Mouth/Throat: Oropharynx is clear and moist.  Eyes: EOM are normal. Pupils are equal, round, and reactive to light.  Neck: Normal range of motion. Neck supple. No thyromegaly present.  Cardiovascular: Normal rate, regular rhythm and normal heart sounds.   No murmur heard. Pulmonary/Chest: Effort normal and breath sounds normal. No respiratory distress. He has no wheezes.  Abdominal: Soft. Bowel sounds are normal. He exhibits no distension and no mass. There is no tenderness.  Genitourinary: Prostate normal and penis normal.  Musculoskeletal: Normal range of motion. He exhibits no edema.  Lymphadenopathy:    He has no cervical adenopathy.  Neurological: He is alert. He exhibits normal muscle tone.  Skin: Skin is warm and dry. No erythema.  Psychiatric: He has a normal mood and affect. His behavior is normal. Judgment normal.  Vitals reviewed.         Assessment & Plan:  Impression #1 wellness exam #2 type 2 diabetes exact control uncertain #3 hyperlipidemia control uncertain. #4 hypertension decent control #5 Mnire's disease decent control plan up-to-date on eye exams. Up-to-date on colonoscopy. Diet exercise discussed in encourage. Medications refilled. Appropriate blood work. Recheck every 6 months. WSL

## 2014-03-16 LAB — MICROALBUMIN, URINE: Microalb, Ur: 60.2 mg/dL — ABNORMAL HIGH (ref <2.0)

## 2014-03-16 LAB — PSA, MEDICARE: PSA: 1.92 ng/mL (ref <=4.00)

## 2014-03-18 ENCOUNTER — Ambulatory Visit (INDEPENDENT_AMBULATORY_CARE_PROVIDER_SITE_OTHER): Payer: Medicare Other | Admitting: Family Medicine

## 2014-03-18 ENCOUNTER — Encounter: Payer: Self-pay | Admitting: Family Medicine

## 2014-03-18 VITALS — BP 176/92 | Ht 70.0 in | Wt 217.0 lb

## 2014-03-18 DIAGNOSIS — E785 Hyperlipidemia, unspecified: Secondary | ICD-10-CM

## 2014-03-18 DIAGNOSIS — E1121 Type 2 diabetes mellitus with diabetic nephropathy: Secondary | ICD-10-CM

## 2014-03-18 DIAGNOSIS — I10 Essential (primary) hypertension: Secondary | ICD-10-CM | POA: Diagnosis not present

## 2014-03-18 MED ORDER — METFORMIN HCL 500 MG PO TABS: ORAL_TABLET | ORAL | Status: DC

## 2014-03-18 NOTE — Progress Notes (Signed)
   Subjective:    Patient ID: Kyle Dunlap, male    DOB: June 30, 1945, 69 y.o.   MRN: [id number]  Diabetes He presents for his initial diabetic visit. He has type 2 diabetes mellitus. Hypoglycemia symptoms include tremors. There are no diabetic associated symptoms. When asked about current treatments, none were reported. He does not see a podiatrist.Eye exam is current.   Patient is here today to go over the BW he had done on 2/22.   Results for orders placed or performed in visit on 03/15/14  Lipid panel  Result Value Ref Range   Cholesterol 286 (H) 0 - 200 mg/dL   Triglycerides 223 (H) <150 mg/dL   HDL 39 (L) >=40 mg/dL   Total CHOL/HDL Ratio 7.3 Ratio   VLDL 45 (H) 0 - 40 mg/dL   LDL Cholesterol 202 (H) 0 - 99 mg/dL  Hepatic function panel  Result Value Ref Range   Total Bilirubin 0.7 0.2 - 1.2 mg/dL   Bilirubin, Direct 0.1 0.0 - 0.3 mg/dL   Indirect Bilirubin 0.6 0.2 - 1.2 mg/dL   Alkaline Phosphatase 100 39 - 117 U/L   AST 21 0 - 37 U/L   ALT 33 0 - 53 U/L   Total Protein 7.3 6.0 - 8.3 g/dL   Albumin 4.1 3.5 - 5.2 g/dL  Basic metabolic panel  Result Value Ref Range   Sodium 137 135 - 145 mEq/L   Potassium 4.4 3.5 - 5.3 mEq/L   Chloride 99 96 - 112 mEq/L   CO2 26 19 - 32 mEq/L   Glucose, Bld 182 (H) 70 - 99 mg/dL   BUN 21 6 - 23 mg/dL   Creat 1.22 0.50 - 1.35 mg/dL   Calcium 9.6 8.4 - 10.5 mg/dL  Hemoglobin A1c  Result Value Ref Range   Hgb A1c MFr Bld 8.2 (H) <5.7 %   Mean Plasma Glucose 189 (H) <117 mg/dL  PSA, Medicare  Result Value Ref Range   PSA 1.92 <=4.00 ng/mL  Microalbumin, urine  Result Value Ref Range   Microalb, Ur 60.2 (H) <2.0 mg/dL  HM DIABETES EYE EXAM  Result Value Ref Range   HM Diabetic Eye Exam No Retinopathy No Retinopathy   Patient admits fair compliance with diet. Not really exercising much no headache no chest pain   Review of Systems  Neurological: Positive for tremors.   No abdominal pain no change in bowel habits      Objective:   Physical Exam Alert no acute distress vital stable lungs clear heart rare rhythm H&T normal ankles without edema see prior diabetic foot exam       Assessment & Plan:  Impression #1 type 2 diabetes discussed at length #2 hyperlipidemia LDL extremely high. Patient unfortunately unable to take any current lipid medicine. Patient advised her is a brand-new medicine costs many thousands per year he absolutely is not interested in this. #3 micro-proteinuria discussed in terms of importance and rationale for pressing on with intervention with poor A1c plan initiate metformin as prescribed. Diet discussed exercise discussed. Follow-up as needed 25 minutes spent most in discussion. WSL

## 2014-06-01 ENCOUNTER — Encounter: Payer: Self-pay | Admitting: Family Medicine

## 2014-06-02 MED ORDER — GLIPIZIDE 5 MG PO TABS: 5.0000 mg | ORAL_TABLET | Freq: Every day | ORAL | Status: DC

## 2014-08-28 ENCOUNTER — Other Ambulatory Visit: Payer: Self-pay | Admitting: Family Medicine

## 2014-09-13 ENCOUNTER — Ambulatory Visit (INDEPENDENT_AMBULATORY_CARE_PROVIDER_SITE_OTHER): Payer: Medicare Other | Admitting: Family Medicine

## 2014-09-13 ENCOUNTER — Encounter: Payer: Self-pay | Admitting: Family Medicine

## 2014-09-13 VITALS — BP 132/82 | Ht 70.0 in | Wt 217.6 lb

## 2014-09-13 DIAGNOSIS — E119 Type 2 diabetes mellitus without complications: Secondary | ICD-10-CM

## 2014-09-13 DIAGNOSIS — I10 Essential (primary) hypertension: Secondary | ICD-10-CM | POA: Diagnosis not present

## 2014-09-13 DIAGNOSIS — E785 Hyperlipidemia, unspecified: Secondary | ICD-10-CM

## 2014-09-13 LAB — POCT GLYCOSYLATED HEMOGLOBIN (HGB A1C): Hemoglobin A1C: 7.2

## 2014-09-13 MED ORDER — TRIAMTERENE-HCTZ 37.5-25 MG PO CAPS: 1.0000 | ORAL_CAPSULE | Freq: Every day | ORAL | Status: DC

## 2014-09-13 MED ORDER — PANTOPRAZOLE SODIUM 40 MG PO TBEC: 40.0000 mg | DELAYED_RELEASE_TABLET | Freq: Every day | ORAL | Status: DC

## 2014-09-13 MED ORDER — GLIPIZIDE 5 MG PO TABS: ORAL_TABLET | ORAL | Status: DC

## 2014-09-13 MED ORDER — DILTIAZEM HCL ER BEADS 360 MG PO CP24
360.0000 mg | ORAL_CAPSULE | Freq: Every day | ORAL | Status: DC
Start: 2014-09-13 — End: 2015-03-05

## 2014-09-13 MED ORDER — DIAZEPAM 2 MG PO TABS: ORAL_TABLET | ORAL | Status: DC

## 2014-09-13 NOTE — Progress Notes (Signed)
   Subjective:    Patient ID: Kyle Dunlap, male    DOB: 1945-04-29, 69 y.o.   MRN: [id number]  Diabetes He presents for his follow-up diabetic visit. He has type 2 diabetes mellitus. Risk factors for coronary artery disease include diabetes mellitus and hypertension. Current diabetic treatment includes oral agent (monotherapy). He is compliant with treatment all of the time. His weight is stable. He is following a diabetic diet. He has not had a previous visit with a dietitian. He does not see a podiatrist.Eye exam is current.   Results for orders placed or performed in visit on 09/13/14  POCT glycosylated hemoglobin (Hb A1C)  Result Value Ref Range   Hemoglobin A1C 7.2    Sugars running decent lately  No low sugar spells  BP good elsewhere  meneires stable on the med, overall doing good Trying to stay waway fr greasy foods and sugars  Patient compliant with blood pressure medication. No obvious side effects. Walking some. But not a lot tenderness heat.  Has started to tighten up his diet as far as his diabetes goes.  Chronic dizziness stable but definitely states needs diazepam to control symptomatology    Review of Systems No headache no chest pain and back pain no abdominal pain no change in bowel habits    Objective:   Physical Exam  Alert vital stable no acute distress. H&T normal. Lungs clear. Heart regular rhythm. Ankles without edema C diabetic foot exam      Assessment & Plan:  Impression 1 type 2 diabetes much improved #2 hypertension good control #3 Mnire's disease ongoing challenges #4 hyperlipidemia discussed at length. Patient intolerant of medications in general. Importance of improvement encouraged plan maintain all medication. Diet exercise discussed. Medications refilled. Recheck in 6 months. WSL

## 2014-10-22 ENCOUNTER — Ambulatory Visit (INDEPENDENT_AMBULATORY_CARE_PROVIDER_SITE_OTHER): Payer: Medicare Other | Admitting: *Deleted

## 2014-10-22 DIAGNOSIS — Z23 Encounter for immunization: Secondary | ICD-10-CM | POA: Diagnosis not present

## 2014-10-28 ENCOUNTER — Other Ambulatory Visit: Payer: Self-pay | Admitting: Family Medicine

## 2014-12-28 ENCOUNTER — Other Ambulatory Visit: Payer: Self-pay | Admitting: *Deleted

## 2014-12-28 MED ORDER — PANTOPRAZOLE SODIUM 40 MG PO TBEC: 40.0000 mg | DELAYED_RELEASE_TABLET | Freq: Every day | ORAL | Status: DC

## 2015-02-22 ENCOUNTER — Other Ambulatory Visit: Payer: Self-pay | Admitting: Family Medicine

## 2015-03-05 ENCOUNTER — Other Ambulatory Visit: Payer: Self-pay | Admitting: Family Medicine

## 2015-03-15 ENCOUNTER — Ambulatory Visit (INDEPENDENT_AMBULATORY_CARE_PROVIDER_SITE_OTHER): Payer: Medicare Other | Admitting: Family Medicine

## 2015-03-15 ENCOUNTER — Encounter: Payer: Self-pay | Admitting: Family Medicine

## 2015-03-15 VITALS — BP 138/76 | Ht 70.0 in | Wt 218.2 lb

## 2015-03-15 DIAGNOSIS — Z79899 Other long term (current) drug therapy: Secondary | ICD-10-CM | POA: Diagnosis not present

## 2015-03-15 DIAGNOSIS — E119 Type 2 diabetes mellitus without complications: Secondary | ICD-10-CM

## 2015-03-15 DIAGNOSIS — H8109 Meniere's disease, unspecified ear: Secondary | ICD-10-CM

## 2015-03-15 DIAGNOSIS — E785 Hyperlipidemia, unspecified: Secondary | ICD-10-CM | POA: Diagnosis not present

## 2015-03-15 DIAGNOSIS — Z1159 Encounter for screening for other viral diseases: Secondary | ICD-10-CM

## 2015-03-15 DIAGNOSIS — Z125 Encounter for screening for malignant neoplasm of prostate: Secondary | ICD-10-CM | POA: Diagnosis not present

## 2015-03-15 DIAGNOSIS — I10 Essential (primary) hypertension: Secondary | ICD-10-CM

## 2015-03-15 LAB — POCT GLYCOSYLATED HEMOGLOBIN (HGB A1C): Hemoglobin A1C: 6.5

## 2015-03-15 MED ORDER — GLIPIZIDE 5 MG PO TABS: ORAL_TABLET | ORAL | Status: DC

## 2015-03-15 MED ORDER — PANTOPRAZOLE SODIUM 40 MG PO TBEC
40.0000 mg | DELAYED_RELEASE_TABLET | Freq: Every day | ORAL | Status: DC
Start: 2015-03-15 — End: 2015-09-12

## 2015-03-15 MED ORDER — TRIAMTERENE-HCTZ 37.5-25 MG PO CAPS: ORAL_CAPSULE | ORAL | Status: DC

## 2015-03-15 NOTE — Progress Notes (Signed)
   Subjective:    Patient ID: Kyle Dunlap, male    DOB: 09/10/1945, 70 y.o.   MRN: [id number]  Diabetes He presents for his follow-up diabetic visit. He has type 2 diabetes mellitus. There are no hypoglycemic associated symptoms. There are no diabetic associated symptoms. There are no hypoglycemic complications. There are no diabetic complications. There are no known risk factors for coronary artery disease. Current diabetic treatment includes oral agent (monotherapy). He is compliant with treatment all of the time.   Patient states he has to call and schedule an appointment for an updated diabetic eye exam.  Patient has no concerns at this time.   Results for orders placed or performed in visit on 03/15/15  POCT glycosylated hemoglobin (Hb A1C)  Result Value Ref Range   Hemoglobin A1C 6.5    Walking some but not a lot  Still having dizziness, ovdral btter tho , this is been disabling to the patient in the past. He still uses Valium when necessary for dizziness.  Compliant blood pressure medicine. Meds reviewed today. No obvious side effects. Watching his salt intake.  History of hyperlipidemia unfortunately unable to take any prescription medications thus far. Next  Reflux ongoing. States definitely meds for this.    Review of Systems No headache no chest pain no back pain no abdominal pain no change in bowel habits    Objective:   Physical Exam Alert vitals stable. HEENT normal. Blood pressure good on repeat lungs clear. Heart rare rhythm ankles without edema       Assessment & Plan:  Impression 1 type 2 diabetes good control discussed #2 hypertension good control meds reviewed discussed #3 hyperlipidemia status uncertain old blood work results discussed #4 Mnire's disease ongoing with substantial challenges with #5 reflux ongoing with need for meds plan medications reviewed. Appropriate blood work. Diet exercise discussed further recommendations based on blood work  results [facility]

## 2015-03-16 ENCOUNTER — Ambulatory Visit: Payer: Medicare Other | Admitting: Family Medicine

## 2015-03-16 LAB — MICROALBUMIN / CREATININE URINE RATIO
CREATININE, UR: 309.6 mg/dL
MICROALB/CREAT RATIO: 71 mg/g creat — ABNORMAL HIGH (ref 0.0–30.0)
Microalbumin, Urine: 219.9 ug/mL

## 2015-03-16 LAB — HEPATIC FUNCTION PANEL
ALK PHOS: 121 IU/L — AB (ref 39–117)
ALT: 45 IU/L — ABNORMAL HIGH (ref 0–44)
AST: 29 IU/L (ref 0–40)
Albumin: 4.3 g/dL (ref 3.5–4.8)
Bilirubin Total: 0.5 mg/dL (ref 0.0–1.2)
Bilirubin, Direct: 0.11 mg/dL (ref 0.00–0.40)
TOTAL PROTEIN: 7.5 g/dL (ref 6.0–8.5)

## 2015-03-16 LAB — LIPID PANEL
CHOLESTEROL TOTAL: 284 mg/dL — AB (ref 100–199)
Chol/HDL Ratio: 7.5 ratio units — ABNORMAL HIGH (ref 0.0–5.0)
HDL: 38 mg/dL — ABNORMAL LOW (ref >39)
LDL Calculated: 204 mg/dL — ABNORMAL HIGH (ref 0–99)
Triglycerides: 212 mg/dL — ABNORMAL HIGH (ref 0–149)
VLDL CHOLESTEROL CAL: 42 mg/dL — AB (ref 5–40)

## 2015-03-16 LAB — BASIC METABOLIC PANEL
BUN/Creatinine Ratio: 16 (ref 10–22)
BUN: 21 mg/dL (ref 8–27)
CO2: 22 mmol/L (ref 18–29)
CREATININE: 1.35 mg/dL — AB (ref 0.76–1.27)
Calcium: 9.3 mg/dL (ref 8.6–10.2)
Chloride: 96 mmol/L (ref 96–106)
GFR calc Af Amer: 61 mL/min/{1.73_m2} (ref >59)
GFR calc non Af Amer: 52 mL/min/{1.73_m2} — ABNORMAL LOW (ref >59)
GLUCOSE: 163 mg/dL — AB (ref 65–99)
POTASSIUM: 4.1 mmol/L (ref 3.5–5.2)
SODIUM: 137 mmol/L (ref 134–144)

## 2015-03-16 LAB — PSA: PROSTATE SPECIFIC AG, SERUM: 1.5 ng/mL (ref 0.0–4.0)

## 2015-03-16 LAB — HEPATITIS C ANTIBODY: Hep C Virus Ab: <0.1 s/co ratio (ref 0.0–0.9)

## 2015-03-20 DIAGNOSIS — H8109 Meniere's disease, unspecified ear: Secondary | ICD-10-CM | POA: Insufficient documentation

## 2015-03-21 ENCOUNTER — Encounter: Payer: Self-pay | Admitting: Family Medicine

## 2015-04-12 ENCOUNTER — Other Ambulatory Visit: Payer: Self-pay | Admitting: Family Medicine

## 2015-04-12 NOTE — Telephone Encounter (Signed)
Ok six mo woth

## 2015-06-14 ENCOUNTER — Other Ambulatory Visit: Payer: Self-pay | Admitting: *Deleted

## 2015-06-14 MED ORDER — TRIAMTERENE-HCTZ 37.5-25 MG PO CAPS: ORAL_CAPSULE | ORAL | Status: DC

## 2015-07-25 LAB — HM DIABETES EYE EXAM

## 2015-09-08 ENCOUNTER — Other Ambulatory Visit: Payer: Self-pay | Admitting: Family Medicine

## 2015-09-12 ENCOUNTER — Ambulatory Visit (HOSPITAL_COMMUNITY)
Admission: RE | Admit: 2015-09-12 | Discharge: 2015-09-12 | Disposition: A | Discharge status: Home / Self Care | Payer: Medicare Other | Source: Ambulatory Visit | Attending: Family Medicine | Admitting: Family Medicine

## 2015-09-12 ENCOUNTER — Encounter: Payer: Self-pay | Admitting: Family Medicine

## 2015-09-12 ENCOUNTER — Ambulatory Visit (INDEPENDENT_AMBULATORY_CARE_PROVIDER_SITE_OTHER): Payer: Medicare Other | Admitting: Family Medicine

## 2015-09-12 VITALS — BP 128/82 | Ht 70.0 in | Wt 220.2 lb

## 2015-09-12 DIAGNOSIS — M160 Bilateral primary osteoarthritis of hip: Secondary | ICD-10-CM | POA: Diagnosis not present

## 2015-09-12 DIAGNOSIS — I10 Essential (primary) hypertension: Secondary | ICD-10-CM | POA: Diagnosis not present

## 2015-09-12 DIAGNOSIS — M5136 Other intervertebral disc degeneration, lumbar region: Secondary | ICD-10-CM | POA: Diagnosis not present

## 2015-09-12 DIAGNOSIS — M545 Low back pain: Secondary | ICD-10-CM

## 2015-09-12 DIAGNOSIS — M25551 Pain in right hip: Secondary | ICD-10-CM

## 2015-09-12 DIAGNOSIS — E119 Type 2 diabetes mellitus without complications: Secondary | ICD-10-CM | POA: Diagnosis not present

## 2015-09-12 DIAGNOSIS — E785 Hyperlipidemia, unspecified: Secondary | ICD-10-CM

## 2015-09-12 LAB — POCT GLYCOSYLATED HEMOGLOBIN (HGB A1C): HEMOGLOBIN A1C: 7

## 2015-09-12 MED ORDER — TRIAMTERENE-HCTZ 37.5-25 MG PO CAPS: ORAL_CAPSULE | ORAL | 1 refills | Status: DC

## 2015-09-12 MED ORDER — PANTOPRAZOLE SODIUM 40 MG PO TBEC: 40.0000 mg | DELAYED_RELEASE_TABLET | Freq: Every day | ORAL | 5 refills | Status: DC

## 2015-09-12 MED ORDER — GLIPIZIDE 5 MG PO TABS: ORAL_TABLET | ORAL | 1 refills | Status: DC

## 2015-09-12 MED ORDER — DILTIAZEM HCL ER BEADS 360 MG PO CP24: ORAL_CAPSULE | ORAL | 5 refills | Status: DC

## 2015-09-12 MED ORDER — PREDNISONE 20 MG PO TABS: ORAL_TABLET | ORAL | 0 refills | Status: DC

## 2015-09-12 NOTE — Progress Notes (Signed)
   Subjective:    Patient ID: Kyle Dunlap, male    DOB: Jul 05, 1945, 70 y.o.   MRN: [id number]  Diabetes  He presents for his follow-up diabetic visit. He has type 2 diabetes mellitus. Risk factors for coronary artery disease include diabetes mellitus, dyslipidemia and hypertension. Current diabetic treatment includes oral agent (monotherapy). He is compliant with treatment all of the time. His weight is stable. He is following a diabetic diet. He has not had a previous visit with a dietitian. He does not see a podiatrist.Eye exam is current.    Hip gets to aching and hurting  Aching    Patient claims compliance with diabetes medication. No obvious side effects. Reports no substantial low sugar spells. Most numbers are generally in good range when checked fasting. Generally does not miss a dose of medication. Watching diabetic diet closely   Blood pressure medicine and blood pressure levels reviewed today with patient. Compliant with blood pressure medicine. States does not miss a dose. No obvious side effects. Blood pressure generally good when checked elsewhere. Watching salt intake.  Patient continues to take lipid medication regularly. No obvious side effects from it. Generally does not miss a dose. Prior blood work results are reviewed with patient. Patient continues to work on fat intake in diet    hip stared aching four or five months ago, some fam hx of arthritits. Starts hurting after two hundred yards  Results for orders placed or performed in visit on 09/12/15  POCT glycosylated hemoglobin (Hb A1C)  Result Value Ref Range   Hemoglobin A1C 7.0   HM DIABETES EYE EXAM  Result Value Ref Range   HM Diabetic Eye Exam No Retinopathy No Retinopathy    Review of Systems .No headache, no major weight loss or weight gain, no chest pain no back pain abdominal pain no change in bowel habits complete ROS otherwise negative     Objective:   Physical Exam  Alert vitals stable,  NAD. Blood pressure good on repeat. HEENT normal. Lungs clear. Heart regular rate and rhythm. Right hip good range of motion pulses excellent no significant pain with rotation of hip. Plus minus straight leg raise on right      Assessment & Plan:  Impression 1 type 2 diabetes still decent control #2 hypertension good control #3 hyperlipidemia and discuss unable to take any medications #4 probable subacute sciatica question element of spinal stenosis symptoms often seems to worsen only when protracted standing or walking. Highly doubt vascular pulses very strong all this discussed plan. Plan x-ray hip low back. Trial of prednisone taper if persists return and we'll set up MRI [facility]

## 2015-10-25 ENCOUNTER — Ambulatory Visit (INDEPENDENT_AMBULATORY_CARE_PROVIDER_SITE_OTHER): Payer: Medicare Other

## 2015-10-25 DIAGNOSIS — Z23 Encounter for immunization: Secondary | ICD-10-CM

## 2015-11-24 ENCOUNTER — Other Ambulatory Visit: Payer: Self-pay | Admitting: *Deleted

## 2015-11-24 MED ORDER — DIAZEPAM 2 MG PO TABS: ORAL_TABLET | ORAL | 3 refills | Status: DC

## 2015-11-24 NOTE — Telephone Encounter (Signed)
Ok plys three mo ref

## 2015-12-05 ENCOUNTER — Other Ambulatory Visit: Payer: Self-pay | Admitting: Family Medicine

## 2016-03-04 ENCOUNTER — Other Ambulatory Visit: Payer: Self-pay | Admitting: Family Medicine

## 2016-03-14 ENCOUNTER — Encounter: Payer: Self-pay | Admitting: Family Medicine

## 2016-03-14 ENCOUNTER — Ambulatory Visit (INDEPENDENT_AMBULATORY_CARE_PROVIDER_SITE_OTHER): Payer: Medicare Other | Admitting: Family Medicine

## 2016-03-14 VITALS — BP 130/84 | Ht 70.0 in | Wt 216.4 lb

## 2016-03-14 DIAGNOSIS — I10 Essential (primary) hypertension: Secondary | ICD-10-CM | POA: Diagnosis not present

## 2016-03-14 DIAGNOSIS — Z125 Encounter for screening for malignant neoplasm of prostate: Secondary | ICD-10-CM | POA: Diagnosis not present

## 2016-03-14 DIAGNOSIS — Z79899 Other long term (current) drug therapy: Secondary | ICD-10-CM

## 2016-03-14 DIAGNOSIS — M25551 Pain in right hip: Secondary | ICD-10-CM

## 2016-03-14 DIAGNOSIS — E785 Hyperlipidemia, unspecified: Secondary | ICD-10-CM | POA: Diagnosis not present

## 2016-03-14 DIAGNOSIS — E119 Type 2 diabetes mellitus without complications: Secondary | ICD-10-CM

## 2016-03-14 LAB — POCT GLYCOSYLATED HEMOGLOBIN (HGB A1C): HEMOGLOBIN A1C: 7.2

## 2016-03-14 MED ORDER — GLIPIZIDE 5 MG PO TABS: 5.0000 mg | ORAL_TABLET | Freq: Two times a day (BID) | ORAL | 1 refills | Status: DC

## 2016-03-14 MED ORDER — PANTOPRAZOLE SODIUM 40 MG PO TBEC: 40.0000 mg | DELAYED_RELEASE_TABLET | Freq: Every day | ORAL | 5 refills | Status: DC

## 2016-03-14 MED ORDER — TRIAMTERENE-HCTZ 37.5-25 MG PO CAPS: ORAL_CAPSULE | ORAL | 1 refills | Status: DC

## 2016-03-14 NOTE — Progress Notes (Signed)
   Subjective:    Patient ID: Kyle Dunlap, male    DOB: 10/16/1945, 71 y.o.   MRN: [id number]  Diabetes  He presents for his follow-up diabetic visit. There are no hypoglycemic associated symptoms. There are no diabetic associated symptoms. There are no hypoglycemic complications. There are no diabetic complications. There are no known risk factors for coronary artery disease. Current diabetic treatment includes oral agent (monotherapy). He is compliant with treatment all of the time.   Patient states that he has no concerns at this time.  Results for orders placed or performed in visit on 03/14/16  POCT glycosylated hemoglobin (Hb A1C)  Result Value Ref Range   Hemoglobin A1C 7.2    Blood pressure medicine and blood pressure levels reviewed today with patient. Compliant with blood pressure medicine. States does not miss a dose. No obvious side effects. Blood pressure generally good when checked elsewhere. Watching salt intake.   Patient claims compliance with diabetes medication. No obvious side effects. Reports no substantial low sugar spells. Most numbers are generally in good range when checked fasting. Generally does not miss a dose of medication. Watching diabetic diet closely  Patient states vertigo under control with current medications. Occasionally flares up. But the medicine definitely helps.  Progressive right hip pain. Worse with certain motions. Comes and goes. History of known arthritis    P generally good control   Review of Systems No headache, no major weight loss or weight gain, no chest pain no back pain abdominal pain no change in bowel habits complete ROS otherwise negative     Objective:   Physical Exam  Alert vitals stable, NAD. Blood pressure good on repeat. HEENT normal. Lungs clear. Heart regular rate and rhythm.  Hip positive pain with rotation.     Assessment & Plan:  Impression 1 type 2 diabetes suboptimal control discussed need increase  Glucotrol No. 2 hypertension good control discussed maintain same meds #3 history of hyperlipidemia on 420 cannot tolerate any medications #4 chronic vertigo ongoing need for meds meds discussed proper use discussed #5 hip arthritis discuss not ready to see orthopedist yet realizes likely occur at one point plan maintain all medications diet exercise discuss increase good control as noted follow-up in 6 months for wellness and chronic visit [facility]

## 2016-03-15 LAB — HEPATIC FUNCTION PANEL
ALBUMIN: 4.4 g/dL (ref 3.5–4.8)
ALT: 55 IU/L — ABNORMAL HIGH (ref 0–44)
AST: 29 IU/L (ref 0–40)
Alkaline Phosphatase: 109 IU/L (ref 39–117)
Bilirubin Total: 0.6 mg/dL (ref 0.0–1.2)
Bilirubin, Direct: 0.14 mg/dL (ref 0.00–0.40)
TOTAL PROTEIN: 7.5 g/dL (ref 6.0–8.5)

## 2016-03-15 LAB — LIPID PANEL
Chol/HDL Ratio: 7.9 — ABNORMAL HIGH (ref 0.0–5.0)
Cholesterol, Total: 307 mg/dL — ABNORMAL HIGH (ref 100–199)
HDL: 39 mg/dL — ABNORMAL LOW (ref >39)
LDL CALC: 216 — AB (ref 0–99)
Triglycerides: 259 mg/dL — ABNORMAL HIGH (ref 0–149)
VLDL CHOLESTEROL CAL: 52 — AB (ref 5–40)

## 2016-03-15 LAB — PSA: Prostate Specific Ag, Serum: 1.7 ng/mL (ref 0.0–4.0)

## 2016-03-15 LAB — MICROALBUMIN / CREATININE URINE RATIO
Creatinine, Urine: 253.4 mg/dL
MICROALB/CREAT RATIO: 45 — AB (ref 0.0–30.0)
Microalbumin, Urine: 114.1 ug/mL

## 2016-03-15 LAB — BASIC METABOLIC PANEL
BUN/Creatinine Ratio: 16 (ref 10–24)
BUN: 20 mg/dL (ref 8–27)
CALCIUM: 9.2 mg/dL (ref 8.6–10.2)
CO2: 22 mmol/L (ref 18–29)
CREATININE: 1.27 mg/dL (ref 0.76–1.27)
Chloride: 95 mmol/L — ABNORMAL LOW (ref 96–106)
GFR calc Af Amer: 65 (ref >59)
GFR, EST NON AFRICAN AMERICAN: 56 — AB (ref >59)
Glucose: 130 mg/dL — ABNORMAL HIGH (ref 65–99)
Potassium: 4.4 mmol/L (ref 3.5–5.2)
Sodium: 139 mmol/L (ref 134–144)

## 2016-03-19 ENCOUNTER — Other Ambulatory Visit: Payer: Self-pay

## 2016-03-19 MED ORDER — ENALAPRIL MALEATE 5 MG PO TABS: 5.0000 mg | ORAL_TABLET | Freq: Every day | ORAL | 5 refills | Status: DC

## 2016-04-03 ENCOUNTER — Encounter: Payer: Self-pay | Admitting: Family Medicine

## 2016-04-05 ENCOUNTER — Encounter: Payer: Self-pay | Admitting: Family Medicine

## 2016-06-05 ENCOUNTER — Other Ambulatory Visit: Payer: Self-pay | Admitting: Family Medicine

## 2016-06-28 ENCOUNTER — Encounter: Payer: Self-pay | Admitting: Family Medicine

## 2016-06-28 LAB — HM DIABETES EYE EXAM

## 2016-07-16 ENCOUNTER — Other Ambulatory Visit: Payer: Self-pay | Admitting: Family Medicine

## 2016-07-16 NOTE — Telephone Encounter (Signed)
Six mo worth ok 

## 2016-07-16 NOTE — Telephone Encounter (Signed)
Last seen 03/14/2016

## 2016-09-06 ENCOUNTER — Other Ambulatory Visit: Payer: Self-pay | Admitting: Family Medicine

## 2016-09-11 ENCOUNTER — Other Ambulatory Visit: Payer: Self-pay | Admitting: *Deleted

## 2016-09-11 ENCOUNTER — Encounter: Payer: Self-pay | Admitting: Family Medicine

## 2016-09-11 ENCOUNTER — Ambulatory Visit (INDEPENDENT_AMBULATORY_CARE_PROVIDER_SITE_OTHER): Payer: Medicare Other | Admitting: Family Medicine

## 2016-09-11 VITALS — BP 128/82 | Ht 70.0 in | Wt 222.4 lb

## 2016-09-11 DIAGNOSIS — Z0000 Encounter for general adult medical examination without abnormal findings: Secondary | ICD-10-CM | POA: Diagnosis not present

## 2016-09-11 DIAGNOSIS — H8103 Meniere's disease, bilateral: Secondary | ICD-10-CM | POA: Diagnosis not present

## 2016-09-11 DIAGNOSIS — I10 Essential (primary) hypertension: Secondary | ICD-10-CM | POA: Diagnosis not present

## 2016-09-11 DIAGNOSIS — M25551 Pain in right hip: Secondary | ICD-10-CM | POA: Diagnosis not present

## 2016-09-11 DIAGNOSIS — E7800 Pure hypercholesterolemia, unspecified: Secondary | ICD-10-CM

## 2016-09-11 DIAGNOSIS — E119 Type 2 diabetes mellitus without complications: Secondary | ICD-10-CM

## 2016-09-11 DIAGNOSIS — H8109 Meniere's disease, unspecified ear: Secondary | ICD-10-CM

## 2016-09-11 LAB — POCT GLYCOSYLATED HEMOGLOBIN (HGB A1C): Hemoglobin A1C: 7

## 2016-09-11 MED ORDER — TRIAMTERENE-HCTZ 37.5-25 MG PO CAPS: ORAL_CAPSULE | ORAL | 1 refills | Status: DC

## 2016-09-11 MED ORDER — DILTIAZEM HCL ER BEADS 360 MG PO CP24: 360.0000 mg | ORAL_CAPSULE | Freq: Every day | ORAL | 1 refills | Status: DC

## 2016-09-11 MED ORDER — ENALAPRIL MALEATE 5 MG PO TABS: 5.0000 mg | ORAL_TABLET | Freq: Every day | ORAL | 1 refills | Status: DC

## 2016-09-11 MED ORDER — PANTOPRAZOLE SODIUM 40 MG PO TBEC: 40.0000 mg | DELAYED_RELEASE_TABLET | Freq: Every day | ORAL | 1 refills | Status: DC

## 2016-09-11 MED ORDER — PANTOPRAZOLE SODIUM 40 MG PO TBEC: 40.0000 mg | DELAYED_RELEASE_TABLET | Freq: Every day | ORAL | 5 refills | Status: DC

## 2016-09-11 MED ORDER — GLIPIZIDE 5 MG PO TABS: 5.0000 mg | ORAL_TABLET | Freq: Two times a day (BID) | ORAL | 1 refills | Status: DC

## 2016-09-11 MED ORDER — DIAZEPAM 2 MG PO TABS: ORAL_TABLET | ORAL | 5 refills | Status: DC

## 2016-09-11 MED ORDER — GLIPIZIDE 5 MG PO TABS
5.0000 mg | ORAL_TABLET | Freq: Two times a day (BID) | ORAL | 1 refills | Status: DC
Start: 2016-09-11 — End: 2016-12-26

## 2016-09-11 NOTE — Progress Notes (Signed)
Subjective:    Patient ID: Kyle Dunlap, male    DOB: 01/25/1945, 71 y.o.   MRN: [id number]  HPI  AWV- Annual Wellness Visit  The patient was seen for their annual wellness visit. The patient's past medical history, surgical history, and family history were reviewed. Pertinent vaccines were reviewed ( tetanus, pneumonia, shingles, flu) The patient's medication list was reviewed and updated.  The height and weight were entered. The patient's current BMI is:31.9  Cognitive screening was completed. Outcome of Mini - Cog: pass  Falls within the past 6 months:none  Current tobacco usage: none (All patients who use tobacco were given written and verbal information on quitting)  Recent listing of emergency department/hospitalizations over the past year were reviewed.  current specialist the patient sees on a regular basis: none   Medicare annual wellness visit patient questionnaire was reviewed.  A written screening schedule for the patient for the next 5-10 years was given. Appropriate discussion of followup regarding next visit was discussed.  Results for orders placed or performed in visit on 09/11/16  POCT glycosylated hemoglobin (Hb A1C)  Result Value Ref Range   Hemoglobin A1C 7.0    Patient claims compliance with diabetes medication. No obvious side effects. Reports no substantial low sugar spells. Most numbers are generally in good range when checked fasting. Generally does not miss a dose of medication. Watching diabetic diet closely  Vertigo ongoing . Control good with valium.comes and goes, ongoing need for meds,  Blood pressure medicine and blood pressure levels reviewed today with patient. Compliant with blood pressure medicine. States does not miss a dose. No obvious side effects. Blood pressure generally good when checked elsewhere. Watching salt intake.  proteinurie enalapril initated, handling well  Patient notes hip pain. Progressive over the past year.  Now affected his ability to take walks. Also notes should shoulder right shoulder, 6 months duration.      Review of Systems  Constitutional: Negative for activity change, appetite change and fever.  HENT: Negative for congestion and rhinorrhea.   Eyes: Negative for discharge.  Respiratory: Negative for cough and wheezing.   Cardiovascular: Negative for chest pain.  Gastrointestinal: Negative for abdominal pain, blood in stool and vomiting.  Genitourinary: Negative for difficulty urinating and frequency.  Musculoskeletal: Negative for neck pain.  Skin: Negative for rash.  Allergic/Immunologic: Negative for environmental allergies and food allergies.  Neurological: Negative for weakness and headaches.  Psychiatric/Behavioral: Negative for agitation.  All other systems reviewed and are negative.      Objective:   Physical Exam  Constitutional: He appears well-developed and well-nourished.  HENT:  Head: Normocephalic and atraumatic.  Right Ear: External ear normal.  Left Ear: External ear normal.  Nose: Nose normal.  Mouth/Throat: Oropharynx is clear and moist.  Eyes: Pupils are equal, round, and reactive to light. EOM are normal.  Neck: Normal range of motion. Neck supple. No thyromegaly present.  Cardiovascular: Normal rate, regular rhythm and normal heart sounds.   No murmur heard. Pulmonary/Chest: Effort normal and breath sounds normal. No respiratory distress. He has no wheezes.  Abdominal: Soft. Bowel sounds are normal. He exhibits no distension and no mass. There is no tenderness.  Genitourinary: Penis normal.  Genitourinary Comments: Prostate exam within normal limits  Musculoskeletal: Normal range of motion. He exhibits no edema.  C diabetic foot exam  Lymphadenopathy:    He has no cervical adenopathy.  Neurological: He is alert. He exhibits normal muscle tone.  Skin: Skin is warm and  dry. No erythema.  Psychiatric: He has a normal mood and affect. His behavior is  normal. Judgment normal.  Vitals reviewed.         Assessment & Plan:  Impression 1 wellness exam. Up-to-date on colonoscopy. Up-to-date on immunizations. Diet and exercise reviewed. #2 type 2 diabetes decent control A1c 7.0 continue same meds #3 hypertension discuss good control maintain meds compliance discussed #4 chronic vertigo. With definite need for ongoing Valium. Helps his symptoms. Actually Mnire's disease. #5 urine micro-proteinuria. Handling enalapril well to continue #6 chronic shoulder and hip pain. Patient has seen Dr. Aline Brochure in past. Due to chronicity of symptoms we'll press on and referred.

## 2016-09-17 ENCOUNTER — Encounter: Payer: Self-pay | Admitting: Family Medicine

## 2016-09-19 NOTE — Patient Instructions (Addendum)
Your procedure is scheduled on: 09/28/2016   Report to [facility] at 7:10  AM.  Call this number if you have problems the morning of surgery: [phone]   Do not eat food or drink liquids :After Midnight.      Take these medicines the morning of surgery with A SIP OF WATER: valium, diltiazem, enalapril,protonix. DO NOT take any medications for diabetes the morning of your surgery.   Do not wear jewelry, make-up or nail polish.  Do not wear lotions, powders, or perfumes. You may wear deodorant.  Do not shave 48 hours prior to surgery.  Do not bring valuables to the hospital.  Contacts, dentures or bridgework may not be worn into surgery.  Leave suitcase in the car. After surgery it may be brought to your room.  For patients admitted to the hospital, checkout time is 11:00 AM the day of discharge.   Patients discharged the day of surgery will not be allowed to drive home.  :     Please read over the following fact sheets that you were given: Coughing and Deep Breathing, Surgical Site Infection Prevention, Anesthesia Post-op Instructions and Care and Recovery After Surgery    Cataract A cataract is a clouding of the lens of the eye. When a lens becomes cloudy, vision is reduced based on the degree and nature of the clouding. Many cataracts reduce vision to some degree. Some cataracts make people more near-sighted as they develop. Other cataracts increase glare. Cataracts that are ignored and become worse can sometimes look white. The white color can be seen through the pupil. CAUSES   Aging. However, cataracts may occur at any age, even in newborns.   Certain drugs.   Trauma to the eye.   Certain diseases such as diabetes.   Specific eye diseases such as chronic inflammation inside the eye or a sudden attack of a rare form of glaucoma.   Inherited or acquired medical problems.  SYMPTOMS   Gradual, progressive drop in vision in the affected eye.   Severe, rapid visual loss. This  most often happens when trauma is the cause.  DIAGNOSIS  To detect a cataract, an eye doctor examines the lens. Cataracts are best diagnosed with an exam of the eyes with the pupils enlarged (dilated) by drops.  TREATMENT  For an early cataract, vision may improve by using different eyeglasses or stronger lighting. If that does not help your vision, surgery is the only effective treatment. A cataract needs to be surgically removed when vision loss interferes with your everyday activities, such as driving, reading, or watching TV. A cataract may also have to be removed if it prevents examination or treatment of another eye problem. Surgery removes the cloudy lens and usually replaces it with a substitute lens (intraocular lens, IOL).  At a time when both you and your doctor agree, the cataract will be surgically removed. If you have cataracts in both eyes, only one is usually removed at a time. This allows the operated eye to heal and be out of danger from any possible problems after surgery (such as infection or poor wound healing). In rare cases, a cataract may be doing damage to your eye. In these cases, your caregiver may advise surgical removal right away. The vast majority of people who have cataract surgery have better vision afterward. HOME CARE INSTRUCTIONS  If you are not planning surgery, you may be asked to do the following:  Use different eyeglasses.   Use stronger or  brighter lighting.   Ask your eye doctor about reducing your medicine dose or changing medicines if it is thought that a medicine caused your cataract. Changing medicines does not make the cataract go away on its own.   Become familiar with your surroundings. Poor vision can lead to injury. Avoid bumping into things on the affected side. You are at a higher risk for tripping or falling.   Exercise extreme care when driving or operating machinery.   Wear sunglasses if you are sensitive to bright light or experiencing  problems with glare.  SEEK IMMEDIATE MEDICAL CARE IF:   You have a worsening or sudden vision loss.   You notice redness, swelling, or increasing pain in the eye.   You have a fever.  Document Released: 01/08/2005 Document Revised: 12/28/2010 Document Reviewed: 09/01/2010 [facility] Patient Information 2012 [place].PATIENT INSTRUCTIONS POST-ANESTHESIA  IMMEDIATELY FOLLOWING SURGERY:  Do not drive or operate machinery for the first twenty four hours after surgery.  Do not make any important decisions for twenty four hours after surgery or while taking narcotic pain medications or sedatives.  If you develop intractable nausea and vomiting or a severe headache please notify your doctor immediately.  FOLLOW-UP:  Please make an appointment with your surgeon as instructed. You do not need to follow up with anesthesia unless specifically instructed to do so.  WOUND CARE INSTRUCTIONS (if applicable):  Keep a dry clean dressing on the anesthesia/puncture wound site if there is drainage.  Once the wound has quit draining you may leave it open to air.  Generally you should leave the bandage intact for twenty four hours unless there is drainage.  If the epidural site drains for more than 36-48 hours please call the anesthesia department.  QUESTIONS?:  Please feel free to call your physician or the hospital operator if you have any questions, and they will be happy to assist you.

## 2016-09-25 ENCOUNTER — Encounter (HOSPITAL_COMMUNITY): Payer: Self-pay

## 2016-09-25 ENCOUNTER — Encounter (HOSPITAL_COMMUNITY)
Admission: RE | Admit: 2016-09-25 | Discharge: 2016-09-25 | Disposition: A | Discharge status: Home / Self Care | Payer: Medicare Other | Source: Ambulatory Visit | Attending: Ophthalmology | Admitting: Ophthalmology

## 2016-09-25 DIAGNOSIS — I10 Essential (primary) hypertension: Secondary | ICD-10-CM | POA: Diagnosis not present

## 2016-09-25 DIAGNOSIS — H269 Unspecified cataract: Secondary | ICD-10-CM | POA: Diagnosis present

## 2016-09-25 DIAGNOSIS — Z7982 Long term (current) use of aspirin: Secondary | ICD-10-CM | POA: Diagnosis not present

## 2016-09-25 DIAGNOSIS — E119 Type 2 diabetes mellitus without complications: Secondary | ICD-10-CM | POA: Diagnosis not present

## 2016-09-25 DIAGNOSIS — K219 Gastro-esophageal reflux disease without esophagitis: Secondary | ICD-10-CM | POA: Diagnosis not present

## 2016-09-25 DIAGNOSIS — E7800 Pure hypercholesterolemia, unspecified: Secondary | ICD-10-CM | POA: Diagnosis not present

## 2016-09-25 DIAGNOSIS — Z79899 Other long term (current) drug therapy: Secondary | ICD-10-CM | POA: Diagnosis not present

## 2016-09-25 LAB — BASIC METABOLIC PANEL
ANION GAP: 10 (ref 5–15)
BUN: 24 mg/dL — AB (ref 6–20)
CO2: 23 mmol/L (ref 22–32)
Calcium: 9.2 mg/dL (ref 8.9–10.3)
Chloride: 97 mmol/L — ABNORMAL LOW (ref 101–111)
Creatinine, Ser: 1.53 mg/dL — ABNORMAL HIGH (ref 0.61–1.24)
GFR calc Af Amer: 50 mL/min — ABNORMAL LOW (ref >60)
GFR, EST NON AFRICAN AMERICAN: 43 mL/min — AB (ref >60)
Glucose, Bld: 246 mg/dL — ABNORMAL HIGH (ref 65–99)
POTASSIUM: 4 mmol/L (ref 3.5–5.1)
SODIUM: 130 mmol/L — AB (ref 135–145)

## 2016-09-25 LAB — CBC
HCT: 40 % (ref 39.0–52.0)
Hemoglobin: 13.8 g/dL (ref 13.0–17.0)
MCH: 30.1 pg (ref 26.0–34.0)
MCHC: 34.5 g/dL (ref 30.0–36.0)
MCV: 87.3 fL (ref 78.0–100.0)
PLATELETS: 276 10*3/uL (ref 150–400)
RBC: 4.58 MIL/uL (ref 4.22–5.81)
RDW: 12.6 % (ref 11.5–15.5)
WBC: 7.7 10*3/uL (ref 4.0–10.5)

## 2016-09-28 ENCOUNTER — Ambulatory Visit (HOSPITAL_COMMUNITY): Payer: Medicare Other | Admitting: Anesthesiology

## 2016-09-28 ENCOUNTER — Ambulatory Visit (HOSPITAL_COMMUNITY)
Admission: RE | Admit: 2016-09-28 | Discharge: 2016-09-28 | Disposition: A | Discharge status: Home / Self Care | Payer: Medicare Other | Source: Ambulatory Visit | Attending: Ophthalmology | Admitting: Ophthalmology

## 2016-09-28 ENCOUNTER — Encounter (HOSPITAL_COMMUNITY)
Admission: RE | Disposition: A | Discharge status: Home / Self Care | Payer: Self-pay | Source: Ambulatory Visit | Attending: Ophthalmology

## 2016-09-28 ENCOUNTER — Encounter (HOSPITAL_COMMUNITY): Payer: Self-pay | Admitting: *Deleted

## 2016-09-28 DIAGNOSIS — K219 Gastro-esophageal reflux disease without esophagitis: Secondary | ICD-10-CM | POA: Insufficient documentation

## 2016-09-28 DIAGNOSIS — Z79899 Other long term (current) drug therapy: Secondary | ICD-10-CM | POA: Insufficient documentation

## 2016-09-28 DIAGNOSIS — I10 Essential (primary) hypertension: Secondary | ICD-10-CM | POA: Insufficient documentation

## 2016-09-28 DIAGNOSIS — E119 Type 2 diabetes mellitus without complications: Secondary | ICD-10-CM | POA: Insufficient documentation

## 2016-09-28 DIAGNOSIS — H269 Unspecified cataract: Secondary | ICD-10-CM | POA: Diagnosis not present

## 2016-09-28 DIAGNOSIS — Z7982 Long term (current) use of aspirin: Secondary | ICD-10-CM | POA: Insufficient documentation

## 2016-09-28 DIAGNOSIS — E7800 Pure hypercholesterolemia, unspecified: Secondary | ICD-10-CM | POA: Diagnosis not present

## 2016-09-28 HISTORY — PX: CATARACT EXTRACTION W/PHACO: SHX586

## 2016-09-28 LAB — GLUCOSE, CAPILLARY: GLUCOSE-CAPILLARY: 156 mg/dL — AB (ref 65–99)

## 2016-09-28 SURGERY — PHACOEMULSIFICATION, CATARACT, WITH IOL INSERTION
Anesthesia: Monitor Anesthesia Care | Site: Eye | Laterality: Left

## 2016-09-28 MED ORDER — CYCLOPENTOLATE-PHENYLEPHRINE 0.2-1 % OP SOLN
1.0000 [drp] | OPHTHALMIC | Status: AC
  Administered 2016-09-28 (×3): 1 [drp] via OPHTHALMIC

## 2016-09-28 MED ORDER — EPINEPHRINE PF 1 MG/ML IJ SOLN
INTRAMUSCULAR | Status: DC | PRN
  Administered 2016-09-28: 500 mL

## 2016-09-28 MED ORDER — NEOMYCIN-POLYMYXIN-DEXAMETH 3.5-10000-0.1 OP SUSP
OPHTHALMIC | Status: DC | PRN
  Administered 2016-09-28: 2 [drp] via OPHTHALMIC

## 2016-09-28 MED ORDER — MIDAZOLAM HCL 2 MG/2ML IJ SOLN
INTRAMUSCULAR | Status: AC
  Filled 2016-09-28: qty 2

## 2016-09-28 MED ORDER — MIDAZOLAM HCL 2 MG/2ML IJ SOLN
1.0000 mg | INTRAMUSCULAR | Status: AC
  Administered 2016-09-28: 2 mg via INTRAVENOUS

## 2016-09-28 MED ORDER — PROVISC 10 MG/ML IO SOLN
INTRAOCULAR | Status: DC | PRN
  Administered 2016-09-28: 0.85 mL via INTRAOCULAR

## 2016-09-28 MED ORDER — TETRACAINE HCL 0.5 % OP SOLN
1.0000 [drp] | OPHTHALMIC | Status: AC
  Administered 2016-09-28 (×3): 1 [drp] via OPHTHALMIC

## 2016-09-28 MED ORDER — PHENYLEPHRINE HCL 2.5 % OP SOLN
1.0000 [drp] | OPHTHALMIC | Status: AC
  Administered 2016-09-28 (×3): 1 [drp] via OPHTHALMIC

## 2016-09-28 MED ORDER — SODIUM HYALURONATE 23 MG/ML IO SOLN
INTRAOCULAR | Status: DC | PRN
  Administered 2016-09-28: 0.6 mL via INTRAOCULAR

## 2016-09-28 MED ORDER — EPINEPHRINE PF 1 MG/ML IJ SOLN
INTRAOCULAR | Status: DC | PRN
  Administered 2016-09-28: 1 mL via OPHTHALMIC

## 2016-09-28 MED ORDER — LIDOCAINE HCL 3.5 % OP GEL
1.0000 "application " | Freq: Once | OPHTHALMIC | Status: AC
  Administered 2016-09-28: 1 via OPHTHALMIC

## 2016-09-28 MED ORDER — LACTATED RINGERS IV SOLN
INTRAVENOUS | Status: DC
  Administered 2016-09-28: 1000 mL via INTRAVENOUS

## 2016-09-28 MED ORDER — POVIDONE-IODINE 5 % OP SOLN
OPHTHALMIC | Status: DC | PRN
  Administered 2016-09-28: 1 via OPHTHALMIC

## 2016-09-28 MED ORDER — FENTANYL CITRATE (PF) 100 MCG/2ML IJ SOLN
INTRAMUSCULAR | Status: AC
  Filled 2016-09-28: qty 2

## 2016-09-28 MED ORDER — FENTANYL CITRATE (PF) 100 MCG/2ML IJ SOLN
25.0000 ug | Freq: Once | INTRAMUSCULAR | Status: AC
  Administered 2016-09-28: 25 ug via INTRAVENOUS

## 2016-09-28 MED ORDER — BSS IO SOLN
INTRAOCULAR | Status: DC | PRN
  Administered 2016-09-28: 15 mL

## 2016-09-28 SURGICAL SUPPLY — 12 items
CLOTH BEACON ORANGE TIMEOUT ST (SAFETY) ×2 IMPLANT
EYE SHIELD UNIVERSAL CLEAR (GAUZE/BANDAGES/DRESSINGS) ×2 IMPLANT
GLOVE BIOGEL PI IND STRL 6.5 (GLOVE) ×2 IMPLANT
GLOVE BIOGEL PI INDICATOR 6.5 (GLOVE) ×2
LENS ALC ACRYL/TECN (Ophthalmic Related) ×2 IMPLANT
NEEDLE HYPO 18GX1.5 BLUNT FILL (NEEDLE) ×2 IMPLANT
PAD ARMBOARD 7.5X6 YLW CONV (MISCELLANEOUS) ×2 IMPLANT
SYR TB 1ML LL NO SAFETY (SYRINGE) ×2 IMPLANT
TAPE SURG TRANSPORE 1 IN (GAUZE/BANDAGES/DRESSINGS) ×1 IMPLANT
TAPE SURGICAL TRANSPORE 1 IN (GAUZE/BANDAGES/DRESSINGS) ×1
VISCOELASTIC ADDITIONAL (OPHTHALMIC RELATED) ×2 IMPLANT
WATER STERILE IRR 250ML POUR (IV SOLUTION) ×2 IMPLANT

## 2016-09-28 NOTE — Anesthesia Postprocedure Evaluation (Signed)
Anesthesia Post Note  Patient: Kyle Dunlap  Procedure(s) Performed: Procedure(s) (LRB): CATARACT EXTRACTION PHACO AND INTRAOCULAR LENS PLACEMENT (IOC) (Left)  Patient location during evaluation: PACU Anesthesia Type: MAC Level of consciousness: awake and alert, oriented and patient cooperative Pain management: pain level controlled Vital Signs Assessment: post-procedure vital signs reviewed and stable Respiratory status: spontaneous breathing Cardiovascular status: stable Postop Assessment: no signs of nausea or vomiting Anesthetic complications: no     Last Vitals:  Vitals:   09/28/16 0840 09/28/16 0845  BP: (!) 123/58 (!) 140/59  Pulse:    Resp: 16 (!) 22  Temp:    SpO2: 99% 98%    Last Pain:  Vitals:   09/28/16 0721  TempSrc: Oral                 Darbie Biancardi A

## 2016-09-28 NOTE — Discharge Instructions (Signed)
PATIENT INSTRUCTIONS POST-ANESTHESIA  IMMEDIATELY FOLLOWING SURGERY:  Do not drive or operate machinery for the first twenty four hours after surgery.  Do not make any important decisions for twenty four hours after surgery or while taking narcotic pain medications or sedatives.  If you develop intractable nausea and vomiting or a severe headache please notify your doctor immediately.  FOLLOW-UP:  Please make an appointment with your surgeon as instructed. You do not need to follow up with anesthesia unless specifically instructed to do so.  WOUND CARE INSTRUCTIONS (if applicable):  Keep a dry clean dressing on the anesthesia/puncture wound site if there is drainage.  Once the wound has quit draining you may leave it open to air.  Generally you should leave the bandage intact for twenty four hours unless there is drainage.  If the epidural site drains for more than 36-48 hours please call the anesthesia department.  QUESTIONS?:  Please feel free to call your physician or the hospital operator if you have any questions, and they will be happy to assist you.        Please discharge patient when stable, will follow up today with Dr. Marisa Hua at the [facility] office at 11:40AM.  Leave shield in place until visit.  All paperwork with discharge instructions will be given at the office.

## 2016-09-28 NOTE — Anesthesia Preprocedure Evaluation (Signed)
Anesthesia Evaluation  Patient identified by MRN, date of birth, ID band Patient awake    Reviewed: Allergy & Precautions, NPO status , Patient's Chart, lab work & pertinent test results  Airway Mallampati: II  TM Distance: >3 FB     Dental  (+) Edentulous Upper   Pulmonary former smoker,    breath sounds clear to auscultation       Cardiovascular hypertension, Pt. on medications  Rhythm:Regular Rate:Normal     Neuro/Psych Meniere's disease negative neurological ROS  negative psych ROS   GI/Hepatic GERD  ,  Endo/Other  diabetes, Type 2, Oral Hypoglycemic Agents  Renal/GU Renal disease     Musculoskeletal  (+) Arthritis ,   Abdominal   Peds  Hematology   Anesthesia Other Findings   Reproductive/Obstetrics                             Anesthesia Physical Anesthesia Plan  ASA: III  Anesthesia Plan: MAC   Post-op Pain Management:    Induction: Intravenous  PONV Risk Score and Plan:   Airway Management Planned: Nasal Cannula  Additional Equipment:   Intra-op Plan:   Post-operative Plan:   Informed Consent: I have reviewed the patients History and Physical, chart, labs and discussed the procedure including the risks, benefits and alternatives for the proposed anesthesia with the patient or authorized representative who has indicated his/her understanding and acceptance.     Plan Discussed with:   Anesthesia Plan Comments:         Anesthesia Quick Evaluation  

## 2016-09-28 NOTE — H&P (Signed)
The H and P was reviewed and updated. The patient was examined.  No changes were found after exam.  The surgical eye was marked.  

## 2016-09-28 NOTE — Op Note (Signed)
Date of procedure: 09/28/16  Pre-operative diagnosis: Visually significant cataract, Left Eye  Post-operative diagnosis: Visually significant cataract, Left Eye  Procedure: Removal of cataract via phacoemulsification and insertion of intra-ocular lens AMO PCB00  +20.5D into the capsular bag of the Left Eye  Attending surgeon: Gerda Diss. Shirelle Tootle, MD, MA  Anesthesia: MAC, Topical Akten  Complications: None  Estimated Blood Loss: <68m (minimal)  Specimens: None  Implants: As above  Indications:  Visually significant cataract, Left Eye  Procedure:  The patient was seen and identified in the pre-operative area. The operative eye was identified and dilated.  The operative eye was marked.  Topical anesthesia was administered to the operative eye.     The patient was then to the operative suite and placed in the supine position.  A timeout was performed confirming the patient, procedure to be performed, and all other relevant information.   The patient's face was prepped and draped in the usual fashion for intra-ocular surgery.  A lid speculum was placed into the operative eye and the surgical microscope moved into place and focused.  A superotemporal paracentesis was created using a 20 gauge paracentesis blade.  Shugarcaine was injected into the anterior chamber.  Viscoelastic was injected into the anterior chamber.  A temporal clear-corneal main wound incision was created using a 2.445mmicrokeratome.  A continuous curvilinear capsulorrhexis was initiated using an irrigating cystitome and completed using capsulorrhexis forceps.  Hydrodissection and hydrodeliniation were performed.  Viscoelastic was injected into the anterior chamber.  A phacoemulsification handpiece and a chopper as a second instrument were used to remove the nucleus and epinucleus. The irrigation/aspiration handpiece was used to remove any remaining cortical material.   The capsular bag was reinflated with viscoelastic, checked,  and found to be intact.  The intraocular lens was inserted into the capsular bag and dialed into place using a Kuglen hook.  The irrigation/aspiration handpiece was used to remove any remaining viscoelastic.  The clear corneal wound and paracentesis wounds were then hydrated and checked with Weck-Cels to be watertight.  The lid-speculum and drape was removed, and the patient's face was cleaned with a wet and dry 4x4.  Maxitrol was instilled in the eye before a clear shield was taped over the eye. The patient was taken to the post-operative care unit in good condition, having tolerated the procedure well.  Post-Op Instructions: [facility] and will receive all other orders and instructions.

## 2016-09-28 NOTE — Transfer of Care (Signed)
Immediate Anesthesia Transfer of Care Note  Patient: Kyle Dunlap  Procedure(s) Performed: Procedure(s) with comments: CATARACT EXTRACTION PHACO AND INTRAOCULAR LENS PLACEMENT (IOC) (Left) - CDE: 4.40  Patient Location: Short Stay  Anesthesia Type:MAC  Level of Consciousness: awake, alert , oriented and patient cooperative  Airway & Oxygen Therapy: Patient Spontanous Breathing  Post-op Assessment: Report given to RN and Post -op Vital signs reviewed and stable  Post vital signs: Reviewed and stable  Last Vitals:  Vitals:   09/28/16 0840 09/28/16 0845  BP: (!) 123/58 (!) 140/59  Pulse:    Resp: 16 (!) 22  Temp:    SpO2: 99% 98%    Last Pain:  Vitals:   09/28/16 0721  TempSrc: Oral      Patients Stated Pain Goal: 5 (62/86/38 1771)  Complications: No apparent anesthesia complications

## 2016-09-28 NOTE — Anesthesia Procedure Notes (Signed)
Procedure Name: MAC Date/Time: 09/28/2016 8:46 AM Performed by: Andree Elk, AMY A Pre-anesthesia Checklist: Patient identified, Timeout performed, Emergency Drugs available, Suction available and Patient being monitored Oxygen Delivery Method: Nasal cannula

## 2016-10-01 ENCOUNTER — Encounter (HOSPITAL_COMMUNITY): Payer: Self-pay | Admitting: Ophthalmology

## 2016-10-22 ENCOUNTER — Encounter (HOSPITAL_COMMUNITY)
Admission: RE | Admit: 2016-10-22 | Discharge: 2016-10-22 | Disposition: A | Discharge status: Home / Self Care | Payer: Medicare Other | Source: Ambulatory Visit | Attending: Ophthalmology | Admitting: Ophthalmology

## 2016-10-22 ENCOUNTER — Encounter (HOSPITAL_COMMUNITY): Payer: Self-pay

## 2016-10-26 ENCOUNTER — Ambulatory Visit (HOSPITAL_COMMUNITY): Payer: Medicare Other | Admitting: Anesthesiology

## 2016-10-26 ENCOUNTER — Ambulatory Visit (HOSPITAL_COMMUNITY)
Admission: RE | Admit: 2016-10-26 | Discharge: 2016-10-26 | Disposition: A | Discharge status: Home / Self Care | Payer: Medicare Other | Source: Ambulatory Visit | Attending: Ophthalmology | Admitting: Ophthalmology

## 2016-10-26 ENCOUNTER — Encounter (HOSPITAL_COMMUNITY)
Admission: RE | Disposition: A | Discharge status: Home / Self Care | Payer: Self-pay | Source: Ambulatory Visit | Attending: Ophthalmology

## 2016-10-26 ENCOUNTER — Encounter (HOSPITAL_COMMUNITY): Payer: Self-pay | Admitting: *Deleted

## 2016-10-26 DIAGNOSIS — Z7984 Long term (current) use of oral hypoglycemic drugs: Secondary | ICD-10-CM | POA: Insufficient documentation

## 2016-10-26 DIAGNOSIS — Z87891 Personal history of nicotine dependence: Secondary | ICD-10-CM | POA: Insufficient documentation

## 2016-10-26 DIAGNOSIS — L111 Transient acantholytic dermatosis [Grover]: Secondary | ICD-10-CM | POA: Insufficient documentation

## 2016-10-26 DIAGNOSIS — Z885 Allergy status to narcotic agent status: Secondary | ICD-10-CM | POA: Insufficient documentation

## 2016-10-26 DIAGNOSIS — K219 Gastro-esophageal reflux disease without esophagitis: Secondary | ICD-10-CM | POA: Insufficient documentation

## 2016-10-26 DIAGNOSIS — M1990 Unspecified osteoarthritis, unspecified site: Secondary | ICD-10-CM | POA: Insufficient documentation

## 2016-10-26 DIAGNOSIS — E7800 Pure hypercholesterolemia, unspecified: Secondary | ICD-10-CM | POA: Insufficient documentation

## 2016-10-26 DIAGNOSIS — Z79899 Other long term (current) drug therapy: Secondary | ICD-10-CM | POA: Insufficient documentation

## 2016-10-26 DIAGNOSIS — H8109 Meniere's disease, unspecified ear: Secondary | ICD-10-CM | POA: Diagnosis not present

## 2016-10-26 DIAGNOSIS — I10 Essential (primary) hypertension: Secondary | ICD-10-CM | POA: Diagnosis not present

## 2016-10-26 DIAGNOSIS — E119 Type 2 diabetes mellitus without complications: Secondary | ICD-10-CM | POA: Diagnosis not present

## 2016-10-26 DIAGNOSIS — H2511 Age-related nuclear cataract, right eye: Secondary | ICD-10-CM | POA: Insufficient documentation

## 2016-10-26 DIAGNOSIS — N289 Disorder of kidney and ureter, unspecified: Secondary | ICD-10-CM | POA: Insufficient documentation

## 2016-10-26 HISTORY — PX: CATARACT EXTRACTION W/PHACO: SHX586

## 2016-10-26 LAB — GLUCOSE, CAPILLARY: GLUCOSE-CAPILLARY: 157 mg/dL — AB (ref 65–99)

## 2016-10-26 SURGERY — PHACOEMULSIFICATION, CATARACT, WITH IOL INSERTION
Anesthesia: Monitor Anesthesia Care | Site: Eye | Laterality: Right

## 2016-10-26 MED ORDER — EPINEPHRINE PF 1 MG/ML IJ SOLN
INTRAOCULAR | Status: DC | PRN
  Administered 2016-10-26: 500 mL

## 2016-10-26 MED ORDER — POVIDONE-IODINE 5 % OP SOLN
OPHTHALMIC | Status: DC | PRN
  Administered 2016-10-26: 1 via OPHTHALMIC

## 2016-10-26 MED ORDER — LIDOCAINE 3.5 % OP GEL OPTIME - NO CHARGE
OPHTHALMIC | Status: DC | PRN
Start: 2016-10-26 — End: 2016-10-26
  Administered 2016-10-26: 1 [drp] via OPHTHALMIC

## 2016-10-26 MED ORDER — LIDOCAINE HCL 3.5 % OP GEL
1.0000 "application " | Freq: Once | OPHTHALMIC | Status: AC
  Administered 2016-10-26: 1 via OPHTHALMIC

## 2016-10-26 MED ORDER — PROVISC 10 MG/ML IO SOLN
INTRAOCULAR | Status: DC | PRN
  Administered 2016-10-26: 0.85 mL via INTRAOCULAR

## 2016-10-26 MED ORDER — MIDAZOLAM HCL 2 MG/2ML IJ SOLN
INTRAMUSCULAR | Status: AC
  Filled 2016-10-26: qty 2

## 2016-10-26 MED ORDER — TETRACAINE HCL 0.5 % OP SOLN
1.0000 [drp] | OPHTHALMIC | Status: AC
  Administered 2016-10-26 (×3): 1 [drp] via OPHTHALMIC

## 2016-10-26 MED ORDER — CYCLOPENTOLATE-PHENYLEPHRINE 0.2-1 % OP SOLN
1.0000 [drp] | OPHTHALMIC | Status: AC
  Administered 2016-10-26 (×3): 1 [drp] via OPHTHALMIC

## 2016-10-26 MED ORDER — EPINEPHRINE PF 1 MG/ML IJ SOLN
INTRAMUSCULAR | Status: AC
  Filled 2016-10-26: qty 1

## 2016-10-26 MED ORDER — FENTANYL CITRATE (PF) 100 MCG/2ML IJ SOLN
25.0000 ug | Freq: Once | INTRAMUSCULAR | Status: AC
  Administered 2016-10-26: 25 ug via INTRAVENOUS

## 2016-10-26 MED ORDER — LIDOCAINE HCL (PF) 1 % IJ SOLN
INTRAMUSCULAR | Status: DC | PRN
  Administered 2016-10-26: 1 mL via OPHTHALMIC

## 2016-10-26 MED ORDER — BSS IO SOLN
INTRAOCULAR | Status: DC | PRN
  Administered 2016-10-26: 15 mL via INTRAOCULAR

## 2016-10-26 MED ORDER — FENTANYL CITRATE (PF) 100 MCG/2ML IJ SOLN
INTRAMUSCULAR | Status: AC
  Filled 2016-10-26: qty 2

## 2016-10-26 MED ORDER — NEOMYCIN-POLYMYXIN-DEXAMETH 3.5-10000-0.1 OP SUSP
OPHTHALMIC | Status: DC | PRN
  Administered 2016-10-26: 2 [drp] via OPHTHALMIC

## 2016-10-26 MED ORDER — LACTATED RINGERS IV SOLN
INTRAVENOUS | Status: DC
  Administered 2016-10-26: 09:00:00 via INTRAVENOUS

## 2016-10-26 MED ORDER — MIDAZOLAM HCL 2 MG/2ML IJ SOLN
1.0000 mg | INTRAMUSCULAR | Status: AC
  Administered 2016-10-26: 2 mg via INTRAVENOUS

## 2016-10-26 MED ORDER — SODIUM HYALURONATE 23 MG/ML IO SOLN
INTRAOCULAR | Status: DC | PRN
  Administered 2016-10-26: 0.6 mL via INTRAOCULAR

## 2016-10-26 MED ORDER — PHENYLEPHRINE HCL 2.5 % OP SOLN
1.0000 [drp] | OPHTHALMIC | Status: AC
  Administered 2016-10-26 (×3): 1 [drp] via OPHTHALMIC

## 2016-10-26 SURGICAL SUPPLY — 14 items
CLOTH BEACON ORANGE TIMEOUT ST (SAFETY) ×2 IMPLANT
EYE SHIELD UNIVERSAL CLEAR (GAUZE/BANDAGES/DRESSINGS) ×2 IMPLANT
GLOVE BIOGEL PI IND STRL 6.5 (GLOVE) ×1 IMPLANT
GLOVE BIOGEL PI IND STRL 7.0 (GLOVE) ×1 IMPLANT
GLOVE BIOGEL PI INDICATOR 6.5 (GLOVE) ×1
GLOVE BIOGEL PI INDICATOR 7.0 (GLOVE) ×1
LENS ALC ACRYL/TECN (Ophthalmic Related) ×2 IMPLANT
NEEDLE HYPO 18GX1.5 BLUNT FILL (NEEDLE) ×2 IMPLANT
PAD ARMBOARD 7.5X6 YLW CONV (MISCELLANEOUS) ×2 IMPLANT
SYR TB 1ML LL NO SAFETY (SYRINGE) ×2 IMPLANT
TAPE SURG TRANSPORE 1 IN (GAUZE/BANDAGES/DRESSINGS) ×1 IMPLANT
TAPE SURGICAL TRANSPORE 1 IN (GAUZE/BANDAGES/DRESSINGS) ×1
VISCOELASTIC ADDITIONAL (OPHTHALMIC RELATED) ×2 IMPLANT
WATER STERILE IRR 250ML POUR (IV SOLUTION) ×2 IMPLANT

## 2016-10-26 NOTE — Anesthesia Postprocedure Evaluation (Signed)
Anesthesia Post Note  Patient: Kyle Dunlap  Procedure(s) Performed: CATARACT EXTRACTION PHACO AND INTRAOCULAR LENS PLACEMENT RIGHT EYE (Right Eye)  Patient location during evaluation: Short Stay Anesthesia Type: MAC Level of consciousness: awake and alert, oriented and patient cooperative Pain management: pain level controlled Vital Signs Assessment: post-procedure vital signs reviewed and stable Respiratory status: spontaneous breathing Cardiovascular status: stable Postop Assessment: no apparent nausea or vomiting Anesthetic complications: no     Last Vitals:  Vitals:   10/26/16 0753 10/26/16 0800  BP:  (!) 167/71  Pulse: 63   Resp: 20 (!) 24  Temp: 36.6 C   SpO2: 96% 98%    Last Pain:  Vitals:   10/26/16 0753  TempSrc: Oral                 ADAMS, AMY A

## 2016-10-26 NOTE — Discharge Instructions (Signed)
Please discharge patient when stable, will follow up today with Dr. Marisa Hua at the [facility] office immediately following procedure.  Leave shield in place until visit.  All paperwork with discharge instructions will be given at the office.       PATIENT INSTRUCTIONS POST-ANESTHESIA  IMMEDIATELY FOLLOWING SURGERY:  Do not drive or operate machinery for the first twenty four hours after surgery.  Do not make any important decisions for twenty four hours after surgery or while taking narcotic pain medications or sedatives.  If you develop intractable nausea and vomiting or a severe headache please notify your doctor immediately.  FOLLOW-UP:  Please make an appointment with your surgeon as instructed. You do not need to follow up with anesthesia unless specifically instructed to do so.  WOUND CARE INSTRUCTIONS (if applicable):  Keep a dry clean dressing on the anesthesia/puncture wound site if there is drainage.  Once the wound has quit draining you may leave it open to air.  Generally you should leave the bandage intact for twenty four hours unless there is drainage.  If the epidural site drains for more than 36-48 hours please call the anesthesia department.  QUESTIONS?:  Please feel free to call your physician or the hospital operator if you have any questions, and they will be happy to assist you.

## 2016-10-26 NOTE — Anesthesia Procedure Notes (Signed)
Procedure Name: MAC Date/Time: 10/26/2016 8:54 AM Performed by: Andree Elk, Adriene Knipfer A Pre-anesthesia Checklist: Patient identified, Emergency Drugs available, Suction available, Patient being monitored and Timeout performed Oxygen Delivery Method: Nasal cannula

## 2016-10-26 NOTE — Op Note (Signed)
Date of procedure: 10/26/16  Pre-operative diagnosis: Visually significant cataract, Right Eye  Post-operative diagnosis: Visually significant cataract, Right Eye  Procedure: Removal of cataract via phacoemulsification and insertion of intra-ocular lens AMO PCB00  +21.0D into the capsular bag of the Right Eye  Attending surgeon: Gerda Diss. Tavius Turgeon, MD, MA  Anesthesia: MAC, Topical Akten  Complications: None  Estimated Blood Loss: <51m (minimal)  Specimens: None  Implants: As above  Indications:  Visually significant cataract, Right Eye  Procedure:  The patient was seen and identified in the pre-operative area. The operative eye was identified and dilated.  The operative eye was marked.  Topical anesthesia was administered to the operative eye.     The patient was then to the operative suite and placed in the supine position.  A timeout was performed confirming the patient, procedure to be performed, and all other relevant information.   The patient's face was prepped and draped in the usual fashion for intra-ocular surgery.  A lid speculum was placed into the operative eye and the surgical microscope moved into place and focused.  A superotemporal paracentesis was created using a 20 gauge paracentesis blade.  Shugarcaine was injected into the anterior chamber.  Viscoelastic was injected into the anterior chamber.  A temporal clear-corneal main wound incision was created using a 2.470mmicrokeratome.  A continuous curvilinear capsulorrhexis was initiated using an irrigating cystitome and completed using capsulorrhexis forceps.  Hydrodissection and hydrodeliniation were performed.  Viscoelastic was injected into the anterior chamber.  A phacoemulsification handpiece and a chopper as a second instrument were used to remove the nucleus and epinucleus. The irrigation/aspiration handpiece was used to remove any remaining cortical material.   The capsular bag was reinflated with viscoelastic,  checked, and found to be intact.  The intraocular lens was inserted into the capsular bag and dialed into place using a Kuglen hook.  The irrigation/aspiration handpiece was used to remove any remaining viscoelastic.  The clear corneal wound and paracentesis wounds were then hydrated and checked with Weck-Cels to be watertight.  The lid-speculum and drape was removed, and the patient's face was cleaned with a wet and dry 4x4.  Maxitrol was instilled in the eye before a clear shield was taped over the eye. The patient was taken to the post-operative care unit in good condition, having tolerated the procedure well.  Post-Op Instructions: [facility] and will receive all other orders and instructions.

## 2016-10-26 NOTE — H&P (Signed)
The H and P was reviewed and updated. The patient was examined.  No changes were found after exam.  The surgical eye was marked.  

## 2016-10-26 NOTE — Transfer of Care (Signed)
Immediate Anesthesia Transfer of Care Note  Patient: Kyle Dunlap  Procedure(s) Performed: CATARACT EXTRACTION PHACO AND INTRAOCULAR LENS PLACEMENT RIGHT EYE (Right Eye)  Patient Location: Short Stay  Anesthesia Type:MAC  Level of Consciousness: awake, alert , oriented and patient cooperative  Airway & Oxygen Therapy: Patient Spontanous Breathing  Post-op Assessment: Report given to RN and Post -op Vital signs reviewed and stable  Post vital signs: Reviewed and stable  Last Vitals:  Vitals:   10/26/16 0753 10/26/16 0800  BP:  (!) 167/71  Pulse: 63   Resp: 20 (!) 24  Temp: 36.6 C   SpO2: 96% 98%    Last Pain:  Vitals:   10/26/16 0753  TempSrc: Oral      Patients Stated Pain Goal: 4 (36/64/40 3474)  Complications: No apparent anesthesia complications

## 2016-10-26 NOTE — Anesthesia Preprocedure Evaluation (Signed)
Anesthesia Evaluation  Patient identified by MRN, date of birth, ID band Patient awake    Reviewed: Allergy & Precautions, NPO status , Patient's Chart, lab work & pertinent test results  Airway Mallampati: II  TM Distance: >3 FB     Dental  (+) Edentulous Upper   Pulmonary former smoker,    breath sounds clear to auscultation       Cardiovascular hypertension, Pt. on medications  Rhythm:Regular Rate:Normal     Neuro/Psych Meniere's disease negative neurological ROS  negative psych ROS   GI/Hepatic GERD  ,  Endo/Other  diabetes, Type 2, Oral Hypoglycemic Agents  Renal/GU Renal disease     Musculoskeletal  (+) Arthritis ,   Abdominal   Peds  Hematology   Anesthesia Other Findings   Reproductive/Obstetrics                             Anesthesia Physical Anesthesia Plan  ASA: III  Anesthesia Plan: MAC   Post-op Pain Management:    Induction: Intravenous  PONV Risk Score and Plan:   Airway Management Planned: Nasal Cannula  Additional Equipment:   Intra-op Plan:   Post-operative Plan:   Informed Consent: I have reviewed the patients History and Physical, chart, labs and discussed the procedure including the risks, benefits and alternatives for the proposed anesthesia with the patient or authorized representative who has indicated his/her understanding and acceptance.     Plan Discussed with:   Anesthesia Plan Comments:         Anesthesia Quick Evaluation

## 2016-10-29 ENCOUNTER — Encounter: Payer: Self-pay | Admitting: Orthopedic Surgery

## 2016-10-29 ENCOUNTER — Ambulatory Visit (INDEPENDENT_AMBULATORY_CARE_PROVIDER_SITE_OTHER): Payer: Medicare Other | Admitting: Orthopedic Surgery

## 2016-10-29 VITALS — BP 171/74 | HR 70 | Resp 16 | Ht 68.0 in | Wt 223.0 lb

## 2016-10-29 DIAGNOSIS — M48062 Spinal stenosis, lumbar region with neurogenic claudication: Secondary | ICD-10-CM | POA: Diagnosis not present

## 2016-10-29 DIAGNOSIS — M249 Joint derangement, unspecified: Secondary | ICD-10-CM | POA: Diagnosis not present

## 2016-10-29 DIAGNOSIS — M1611 Unilateral primary osteoarthritis, right hip: Secondary | ICD-10-CM | POA: Diagnosis not present

## 2016-10-29 NOTE — Patient Instructions (Addendum)
We are referring you to [facility] for physical therapy   Spinal Stenosis Spinal stenosis happens when the open space (spinal canal) between the bones of your spine (vertebrae) gets smaller. It is caused by bone pushing into the open spaces of your backbone (spine). This puts pressure on your backbone and the nerves in your backbone. Treatment often focuses on managing any pain and symptoms. In some cases, surgery may be needed. Follow these instructions at home: Managing pain, stiffness, and swelling  Do all exercises and stretches as told by your doctor.  Stand and sit up straight (use good posture). If you were given a brace or a corset, wear it as told by your doctor.  Do not do any activities that cause pain. Ask your doctor what activities are safe for you.  Do not lift anything that is heavier than 10 lb (4.5 kg) or heavier than your doctor tells you.  Try to stay at a healthy weight. Talk with your doctor if you need help losing weight.  If directed, put heat on the affected area as often as told by your doctor. Use the heat source that your doctor recommends, such as a moist heat pack or a heating pad. ? Put a towel between your skin ad the heat source. ? Leave the heat on for 20-30 minutes. ? Remove the heat if your skin turns bright red. This is especially important if you are not able to feel pain, heat, or cold. You may have a greater risk of getting burned. General instructions  Take over-the-counter and prescription medicines only as told by your doctor.  Do not use any products that contain nicotine or tobacco, such as cigarettes and e-cigarettes. If you need help quitting, ask your doctor.  Eat a healthy diet. This includes plenty of fruits and vegetables, whole grains, and low-fat (lean) protein.  Keep all follow-up visits as told by your doctor. This is important. Contact a doctor if:  Your symptoms do not get better.  Your symptoms get worse.  You have a  fever. Get help right away if:  You have new or worse pain in your neck or upper back.  You have very bad pain that medicine does not control.  You are dizzy.  You have vision problems, blurred vision, or double vision.  You have a very bad headache that is worse when you stand.  You feel sick to your stomach (nauseous).  You throw up (vomit).  You have new or worse numbness or tingling in your back or legs.  You have pain, redness, swelling, or warmth in your arm or leg. Summary  Spinal stenosis happens when the open space (spinal canal) between the bones of your spine gets smaller (narrow).  Contact a doctor if your symptoms get worse.  In some cases, surgery may be needed. This information is not intended to replace advice given to you by your health care provider. Make sure you discuss any questions you have with your health care provider. Document Released: 05/04/2010 Document Revised: 12/14/2015 Document Reviewed: 12/14/2015 Elsevier Interactive Patient Education  2017 [employer].

## 2016-10-29 NOTE — Progress Notes (Signed)
Patient ID: Kyle Dunlap, male   DOB: 06/28/1945, 71 y.o.   MRN: [id number]  Chief Complaint  Patient presents with  . Hip Pain    right SI joint area painful states has had low back injury in [place], but this pain is different from usual LBP     HPI Kyle Dunlap is a 71 y.o. male.  The patient presents with a greater than one-year history of right hip pain which is associated with walking 100 yards and then right radicular pain on the right leg towards the knee. He denies any groin or thigh pain.  He reports an injury in [place] where he injured his back.  He has had x-rays and he is on Tylenol no therapy at this point  He describes pain over the right lower back and SI joint which is dull mild worse with walking 100 yards and it's repeatable at 100 yards. He's had for greater than 1 year no previous treatment  Review of Systems Review of Systems  Constitutional: Negative.   Respiratory: Negative for shortness of breath.   Cardiovascular: Negative for chest pain.  Gastrointestinal: Negative.   Genitourinary: Negative.   Musculoskeletal: Positive for back pain and gait problem.  Skin: Negative.   Neurological: Negative for dizziness and syncope.  Hematological: Negative for adenopathy. Does not bruise/bleed easily.  Psychiatric/Behavioral: Negative for confusion.    Past Medical History:  Diagnosis Date  . Adenomatous colon polyp 11/2008   Dr. Gala Romney  . Allergy   . Chronic back pain   . DDD (degenerative disc disease), lumbar   . Hemorrhoids, external   . High cholesterol   . HTN (hypertension)   . Hyperplastic colon polyp 11/2008   Dr. Gala Romney  . Kidney stone    "14 years ago" per pt  . Meniere's disease   . Tachyarrhythmia   . Type 2 diabetes mellitus ([place])     Past Surgical History:  Procedure Laterality Date  . CATARACT EXTRACTION W/PHACO Left 09/28/2016   Procedure: CATARACT EXTRACTION PHACO AND INTRAOCULAR LENS PLACEMENT (IOC);  Surgeon: Baruch Goldmann,  MD;  Location: [facility];  Service: Ophthalmology;  Laterality: Left;  CDE: 4.40  . CHOLECYSTECTOMY    . COLONOSCOPY  12/10/2008   Dr. Gala Romney- L side diverticula, adenomatous polyp, hyperplastic polyp  . COLONOSCOPY  11/16/2003   Dr. Laural Golden- performed exam to cecum,3 polyps- no path report available, external hemorrhoids,   . COLONOSCOPY N/A 01/04/2014   Procedure: COLONOSCOPY;  Surgeon: Daneil Dolin, MD;  Location: [facility] SUITE;  Service: Endoscopy;  Laterality: N/A;  8:30am  . ESOPHAGOGASTRODUODENOSCOPY  08/31/2011   WER:[id number] esophageal erosions consistent with mild erosive reflux esophagitis  . MOUTH SURGERY      Family History  Problem Relation Age of Onset  . Heart attack Father    was reviewed  Social History Social History  Substance Use Topics  . Smoking status: Former Research scientist (life sciences)  . Smokeless tobacco: Former Systems developer    Quit date: 07/14/1985  . Alcohol use No    Allergies  Allergen Reactions  . Lipitor [Atorvastatin]     Joint and muscle aches  . Zetia [Ezetimibe]     Elevated liver enzymes  . Zocor [Simvastatin]     Joint and muscle aches  . Codeine Other (See Comments)    Passed out    Current Outpatient Prescriptions  Medication Sig Dispense Refill  . acetaminophen (TYLENOL) 500 MG tablet Take 1,000 mg by mouth 2 (two) times daily as needed  for mild pain or headache.     . Ascorbic Acid (VITAMIN C) 1000 MG tablet Take 1,000 mg by mouth 2 (two) times daily.     Marland Kitchen aspirin 81 MG tablet Take 81 mg by mouth daily.    . diazepam (VALIUM) 2 MG tablet Take 2 mg by mouth at bedtime as needed (Dizziness).     Marland Kitchen diltiazem (TIAZAC) 360 MG 24 hr capsule Take 1 capsule (360 mg total) by mouth daily. 90 capsule 1  . enalapril (VASOTEC) 5 MG tablet Take 1 tablet (5 mg total) by mouth at bedtime. 90 tablet 1  . glipiZIDE (GLUCOTROL) 5 MG tablet Take 1 tablet (5 mg total) by mouth 2 (two) times daily before a meal. 180 tablet 1  . ibuprofen (ADVIL,MOTRIN) 200 MG tablet Take 400 mg by  mouth 2 (two) times daily as needed for headache or mild pain.     Marland Kitchen MEGARED OMEGA-3 KRILL OIL 500 MG CAPS Take 1,000 mg by mouth daily.     . Multiple Vitamin (MULTIVITAMIN WITH MINERALS) TABS Take 1 tablet by mouth daily.    . pantoprazole (PROTONIX) 40 MG tablet Take 1 tablet (40 mg total) by mouth daily. 90 tablet 1  . triamterene-hydrochlorothiazide (DYAZIDE) 37.5-25 MG capsule TAKE 1 EACH (1 CAPSULE TOTAL) BY MOUTH DAILY. 90 capsule 1   No current facility-administered medications for this visit.        Physical Exam BP (!) 171/74   Pulse 70   Resp 16   Ht 5\' 8"  (1.727 m)   Wt 223 lb (101.2 kg)   BMI 33.91 kg/m  Physical Exam Gen. appearance: The patient is well-developed and well-nourished grooming and hygiene are normal The patient is oriented to person place and time The patient's mood is normal and the affect is normal  Ortho Exam Gait assessment: The patient stands with normal gait and station  Lumbar spine Tenderness  to palpation is noted in the lower L4-5 and 5 S1 segment  Range of motion  decreased flexion and pain with extension Muscle tone normal muscle tone on the right and left sides of the spine  Lower extremities right and left Normal range of motion knee and ankle; hip flexion is good but his internal rotation bilaterally is limited to neutral position All 3 joints are reduced and stable  Strength right lower extremity normal hip flexor the extensor knee flexor ankle dorsiflexion plantar flexion Strength left lower extremity same his right lower extremity  Neurologic right lower extremity examination  Reflexes were 2+ and equal at the knee and 1+ and equal at the ankle    Sensation was normal in both feet and legs    Babinski's tests were down going  Straight leg raise testing was negative  normal bilaterally  The vascular examination revealed normal dorsalis pedis pulses in both feet and both feet were warm with good capillary refill   Data  Reviewed I have independently reviewed the films from the hospital and this is my personal interpretation: Plain films of the hip including a pelvis show very mild degenerative changes in the right hip.  The lumbar spine x-rays show L4-5 disc space narrowing consistent with degenerative disc disease  Assessment  Encounter Diagnoses  Name Primary?  . Spinal stenosis, lumbar region, with neurogenic claudication Yes  . Arthritis of right hip   . Derangement of right SI joint      Plan  [facility] for physical therapy   FU as needed  Arther Abbott, MD 10/29/2016 9:03 AM

## 2016-11-07 ENCOUNTER — Ambulatory Visit (INDEPENDENT_AMBULATORY_CARE_PROVIDER_SITE_OTHER): Payer: Medicare Other

## 2016-11-07 DIAGNOSIS — Z23 Encounter for immunization: Secondary | ICD-10-CM | POA: Diagnosis not present

## 2016-12-06 ENCOUNTER — Encounter: Payer: Self-pay | Admitting: Internal Medicine

## 2016-12-26 ENCOUNTER — Other Ambulatory Visit: Payer: Self-pay | Admitting: *Deleted

## 2016-12-26 MED ORDER — GLIPIZIDE 5 MG PO TABS: 5.0000 mg | ORAL_TABLET | Freq: Two times a day (BID) | ORAL | 0 refills | Status: DC

## 2017-01-28 ENCOUNTER — Ambulatory Visit: Payer: Medicare Other

## 2017-01-28 DIAGNOSIS — Z86010 Personal history of colonic polyps: Secondary | ICD-10-CM

## 2017-01-28 MED ORDER — PEG 3350-KCL-NA BICARB-NACL 420 G PO SOLR: 4000.0000 mL | ORAL | 0 refills | Status: DC

## 2017-01-28 NOTE — Patient Instructions (Signed)
Kyle Dunlap   12/02/1945 MRN: [id number]    Procedure Date: 02/20/17 Time to register: 11:00 Place to register: [facility] Procedure Time: 12:00 Scheduled provider: Dr.Rourk  PREPARATION FOR COLONOSCOPY WITH TRI-LYTE SPLIT PREP  Please notify us immediately if you are diabetic, take iron supplements, or if you are on Coumadin or any other blood thinners.   Please hold the following medications: will call with this information  You will need to purchase 1 fleet enema and 1 box of Bisacodyl 79m tablets.   2 DAYS BEFORE PROCEDURE:  DATE: 02/18/17   DAY: Monday Begin clear liquid diet AFTER your lunch meal. NO SOLID FOODS after this point.  1 DAY BEFORE PROCEDURE:  DATE: 02/19/17  DAY: Tuesday Continue clear liquids the entire day - NO SOLID FOOD.   Diabetic medications adjustments for today: (will call)  At 2:00 pm:  Take 2 Bisacodyl tablets.   At 4:00pm:  Start drinking your solution. Make sure you mix well per instructions on the bottle. Try to drink 1 (one) 8 ounce glass every 10-15 minutes until you have consumed HALF the jug. You should complete by 6:00pm.You must keep the left over solution refrigerated until completed next day.  Continue clear liquids. You must drink plenty of clear liquids to prevent dehyration and kidney failure. Nothing to eat or drink after midnight.  EXCEPTION: If you take medications for your heart, blood pressure or breathing, you may take these medications with a small amount of clear liquid.    DAY OF PROCEDURE:   DATE: 02/20/17  DAY: Wednesday  Diabetic medications adjustments for today:( will call)  Five hours before your procedure time @ 7:00am:  Finish remaining amout of bowel prep, drinking 1 (one) 8 ounce glass every 10-15 minutes until complete. You have two hours to consume remaining prep.   Three hours before your procedure time _0 :00am:  Nothing by mouth.   At least one hour before going to the hospital:  Give yourself  one Fleet enema. You may take your morning medications with sip of water unless we have instructed otherwise.      Please see below for Dietary Information.  CLEAR LIQUIDS INCLUDE:  Water Jello (NOT red in color)   Ice Popsicles (NOT red in color)   Tea (sugar ok, no milk/cream) Powdered fruit flavored drinks  Coffee (sugar ok, no milk/cream) Gatorade/ Lemonade/ Kool-Aid  (NOT red in color)   Juice: apple, white grape, white cranberry Soft drinks  Clear bullion, consomme, broth (fat free beef/chicken/vegetable)  Carbonated beverages (any kind)  Strained chicken noodle soup Hard Candy   Remember: Clear liquids are liquids that will allow you to see your fingers on the other side of a clear glass. Be sure liquids are NOT red in color, and not cloudy, but CLEAR.  DO NOT EAT OR DRINK ANY OF THE FOLLOWING:  Dairy products of any kind   Cranberry juice Tomato juice / V8 juice   Grapefruit juice Orange juice     Red grape juice  Do not eat any solid foods, including such foods as: cereal, oatmeal, yogurt, fruits, vegetables, creamed soups, eggs, bread, crackers, pureed foods in a blender, etc.   HELPFUL HINTS FOR DRINKING PREP SOLUTION:   Make sure prep is extremely cold. Mix and refrigerate the the morning of the prep. You may also put in the freezer.   You may try mixing some Crystal Light or Country Time Lemonade if you prefer. Mix in small amounts; add more  if necessary.  Try drinking through a straw  Rinse mouth with water or a mouthwash between glasses, to remove after-taste.  Try sipping on a cold beverage /ice/ popsicles between glasses of prep.  Place a piece of sugar-free hard candy in mouth between glasses.  If you become nauseated, try consuming smaller amounts, or stretch out the time between glasses. Stop for 30-60 minutes, then slowly start back drinking.        OTHER INSTRUCTIONS  You will need a responsible adult at least 74 years of age to accompany you  and drive you home. This person must remain in the waiting room during your procedure. The hospital will cancel your procedure if you do not have a responsible adult with you.   1. Wear loose fitting clothing that is easily removed. 2. Leave jewelry and other valuables at home.  3. Remove all body piercing jewelry and leave at home. 4. Total time from sign-in until discharge is approximately 2-3 hours. 5. You should go home directly after your procedure and rest. You can resume normal activities the day after your procedure. 6. The day of your procedure you should not:  Drive  Make legal decisions  Operate machinery  Drink alcohol  Return to work   You may call the office (Dept: [phone]) before 5:00pm, or page the doctor on call [phone]) after 5:00pm, for further instructions, if necessary.   Insurance Information YOU WILL NEED TO CHECK WITH YOUR INSURANCE COMPANY FOR THE BENEFITS OF COVERAGE YOU HAVE FOR THIS PROCEDURE.  UNFORTUNATELY, NOT ALL INSURANCE COMPANIES HAVE BENEFITS TO COVER ALL OR PART OF THESE TYPES OF PROCEDURES.  IT IS YOUR RESPONSIBILITY TO CHECK YOUR BENEFITS, HOWEVER, WE WILL BE GLAD TO ASSIST YOU WITH ANY CODES YOUR INSURANCE COMPANY MAY NEED.    PLEASE NOTE THAT MOST INSURANCE COMPANIES WILL NOT COVER A SCREENING COLONOSCOPY FOR PEOPLE UNDER THE AGE OF 50  IF YOU HAVE BCBS INSURANCE, YOU MAY HAVE BENEFITS FOR A SCREENING COLONOSCOPY BUT IF POLYPS ARE FOUND THE DIAGNOSIS WILL CHANGE AND THEN YOU MAY HAVE A DEDUCTIBLE THAT WILL NEED TO BE MET. SO PLEASE MAKE SURE YOU CHECK YOUR BENEFITS FOR A SCREENING COLONOSCOPY AS WELL AS A DIAGNOSTIC COLONOSCOPY.

## 2017-01-28 NOTE — Progress Notes (Signed)
Gastroenterology Pre-Procedure Review  Diabetic medication adjustment?   Request Date:01/28/17 Requesting Physician: Dr.Rourk  PATIENT REVIEW QUESTIONS: The patient responded to the following health history questions as indicated:    1. Diabetes Melitis: yes (takes glucotrol) 2. Joint replacements in the past 12 months: no 3. Major health problems in the past 3 months: no 4. Has an artificial valve or MVP: no 5. Has a defibrillator: no 6. Has been advised in past to take antibiotics in advance of a procedure like teeth cleaning: no 7. Family history of colon cancer: yes (brother age 81)  10. Alcohol Use: yes (1x week) 9. History of sleep apnea: no  10. History of coronary artery or other vascular stents placed within the last 12 months: no 11. History of any prior anesthesia complications: no    MEDICATIONS & ALLERGIES:    Patient reports the following regarding taking any blood thinners:   Plavix? no Aspirin? yes (81mg ) Coumadin? no Brilinta? no Xarelto? no Eliquis? no Pradaxa? no Savaysa? no Effient? no  Patient confirms/reports the following medications:  Current Outpatient Medications  Medication Sig Dispense Refill  . acetaminophen (TYLENOL) 500 MG tablet Take 1,000 mg by mouth 2 (two) times daily as needed for mild pain or headache.     . Ascorbic Acid (VITAMIN C) 1000 MG tablet Take 1,000 mg by mouth 2 (two) times daily.     Marland Kitchen aspirin 81 MG tablet Take 81 mg by mouth daily.    . diazepam (VALIUM) 2 MG tablet Take 2 mg by mouth at bedtime as needed (Dizziness).     Marland Kitchen diltiazem (TIAZAC) 360 MG 24 hr capsule Take 1 capsule (360 mg total) by mouth daily. 90 capsule 1  . enalapril (VASOTEC) 5 MG tablet Take 1 tablet (5 mg total) by mouth at bedtime. 90 tablet 1  . glipiZIDE (GLUCOTROL) 5 MG tablet Take 1 tablet (5 mg total) by mouth 2 (two) times daily before a meal. 180 tablet 0  . ibuprofen (ADVIL,MOTRIN) 200 MG tablet Take 400 mg by mouth 2 (two) times daily as needed for  headache or mild pain.     Marland Kitchen MEGARED OMEGA-3 KRILL OIL 500 MG CAPS Take 1,000 mg by mouth daily.     . Multiple Vitamin (MULTIVITAMIN WITH MINERALS) TABS Take 1 tablet by mouth daily.    . pantoprazole (PROTONIX) 40 MG tablet Take 1 tablet (40 mg total) by mouth daily. 90 tablet 1  . triamterene-hydrochlorothiazide (DYAZIDE) 37.5-25 MG capsule TAKE 1 EACH (1 CAPSULE TOTAL) BY MOUTH DAILY. 90 capsule 1   No current facility-administered medications for this visit.     Patient confirms/reports the following allergies:  Allergies  Allergen Reactions  . Lipitor [Atorvastatin]     Joint and muscle aches  . Zetia [Ezetimibe]     Elevated liver enzymes  . Zocor [Simvastatin]     Joint and muscle aches  . Codeine Other (See Comments)    Passed out    No orders of the defined types were placed in this encounter.   AUTHORIZATION INFORMATION Primary Insurance: [place],  [place] #: [id number] Pre-Cert / Josem Kaufmann required:  Pre-Cert / Auth #:   SCHEDULE INFORMATION: Procedure has been scheduled as follows:  Date: 02/20/17, Time: 12:00  Location:[facility]  This Gastroenterology Pre-Precedure Review Form is being routed to the following provider(s): Roseanne Kaufman NP

## 2017-01-30 NOTE — Progress Notes (Signed)
Can we find out how often he is taking Valium?

## 2017-01-31 NOTE — Progress Notes (Signed)
Pt stated he is only taking the valium as needed, maybe 1-2x a week when he does need it.  Pt is also takes his DM medication twice a day.

## 2017-02-01 NOTE — Progress Notes (Signed)
Spoke with Iris at [facility]- pt does not need a precert for a colonoscopy. Cpt code [phone].

## 2017-02-06 NOTE — Progress Notes (Signed)
Tried to call pt- NA-LMOM to return call.  

## 2017-02-06 NOTE — Progress Notes (Signed)
No glucotrol day of procedure.

## 2017-02-07 ENCOUNTER — Telehealth: Payer: Self-pay | Admitting: Internal Medicine

## 2017-02-07 NOTE — Progress Notes (Signed)
Pt is aware.  

## 2017-02-07 NOTE — Telephone Encounter (Signed)
Patient said he was returning a call from yesterday, Please call him back at [phone]

## 2017-02-07 NOTE — Telephone Encounter (Signed)
Spoke with the pt.  

## 2017-02-20 ENCOUNTER — Other Ambulatory Visit: Payer: Self-pay

## 2017-02-20 ENCOUNTER — Encounter (HOSPITAL_COMMUNITY)
Admission: RE | Disposition: A | Discharge status: Home / Self Care | Payer: Self-pay | Source: Ambulatory Visit | Attending: Internal Medicine

## 2017-02-20 ENCOUNTER — Encounter (HOSPITAL_COMMUNITY): Payer: Self-pay | Admitting: *Deleted

## 2017-02-20 ENCOUNTER — Ambulatory Visit (HOSPITAL_COMMUNITY)
Admission: RE | Admit: 2017-02-20 | Discharge: 2017-02-20 | Disposition: A | Discharge status: Home / Self Care | Payer: Medicare Other | Source: Ambulatory Visit | Attending: Internal Medicine | Admitting: Internal Medicine

## 2017-02-20 DIAGNOSIS — I10 Essential (primary) hypertension: Secondary | ICD-10-CM | POA: Diagnosis not present

## 2017-02-20 DIAGNOSIS — Z800 Family history of malignant neoplasm of digestive organs: Secondary | ICD-10-CM | POA: Insufficient documentation

## 2017-02-20 DIAGNOSIS — D120 Benign neoplasm of cecum: Secondary | ICD-10-CM | POA: Diagnosis not present

## 2017-02-20 DIAGNOSIS — D123 Benign neoplasm of transverse colon: Secondary | ICD-10-CM | POA: Diagnosis not present

## 2017-02-20 DIAGNOSIS — K644 Residual hemorrhoidal skin tags: Secondary | ICD-10-CM | POA: Diagnosis not present

## 2017-02-20 DIAGNOSIS — E119 Type 2 diabetes mellitus without complications: Secondary | ICD-10-CM | POA: Diagnosis not present

## 2017-02-20 DIAGNOSIS — H8109 Meniere's disease, unspecified ear: Secondary | ICD-10-CM | POA: Diagnosis not present

## 2017-02-20 DIAGNOSIS — Z86010 Personal history of colonic polyps: Secondary | ICD-10-CM | POA: Insufficient documentation

## 2017-02-20 DIAGNOSIS — Z87442 Personal history of urinary calculi: Secondary | ICD-10-CM | POA: Insufficient documentation

## 2017-02-20 DIAGNOSIS — Z87891 Personal history of nicotine dependence: Secondary | ICD-10-CM | POA: Insufficient documentation

## 2017-02-20 DIAGNOSIS — G8929 Other chronic pain: Secondary | ICD-10-CM | POA: Diagnosis not present

## 2017-02-20 DIAGNOSIS — Z7982 Long term (current) use of aspirin: Secondary | ICD-10-CM | POA: Insufficient documentation

## 2017-02-20 DIAGNOSIS — Z1211 Encounter for screening for malignant neoplasm of colon: Secondary | ICD-10-CM | POA: Diagnosis present

## 2017-02-20 DIAGNOSIS — D124 Benign neoplasm of descending colon: Secondary | ICD-10-CM | POA: Diagnosis not present

## 2017-02-20 DIAGNOSIS — M5136 Other intervertebral disc degeneration, lumbar region: Secondary | ICD-10-CM | POA: Diagnosis not present

## 2017-02-20 DIAGNOSIS — D122 Benign neoplasm of ascending colon: Secondary | ICD-10-CM | POA: Diagnosis not present

## 2017-02-20 DIAGNOSIS — E7800 Pure hypercholesterolemia, unspecified: Secondary | ICD-10-CM | POA: Diagnosis not present

## 2017-02-20 DIAGNOSIS — Z79899 Other long term (current) drug therapy: Secondary | ICD-10-CM | POA: Diagnosis not present

## 2017-02-20 HISTORY — PX: COLONOSCOPY: SHX5424

## 2017-02-20 LAB — GLUCOSE, CAPILLARY: Glucose-Capillary: 177 mg/dL — ABNORMAL HIGH (ref 65–99)

## 2017-02-20 SURGERY — COLONOSCOPY
Anesthesia: Moderate Sedation

## 2017-02-20 MED ORDER — MIDAZOLAM HCL 5 MG/5ML IJ SOLN
INTRAMUSCULAR | Status: DC | PRN
  Administered 2017-02-20 (×2): 2 mg via INTRAVENOUS
  Administered 2017-02-20: 1 mg via INTRAVENOUS

## 2017-02-20 MED ORDER — SODIUM CHLORIDE 0.9 % IV SOLN
INTRAVENOUS | Status: DC
  Administered 2017-02-20: 11:00:00 via INTRAVENOUS

## 2017-02-20 MED ORDER — MEPERIDINE HCL 100 MG/ML IJ SOLN
INTRAMUSCULAR | Status: AC
  Filled 2017-02-20: qty 2

## 2017-02-20 MED ORDER — MIDAZOLAM HCL 5 MG/5ML IJ SOLN
INTRAMUSCULAR | Status: AC
  Filled 2017-02-20: qty 10

## 2017-02-20 MED ORDER — ONDANSETRON HCL 4 MG/2ML IJ SOLN
INTRAMUSCULAR | Status: DC | PRN
  Administered 2017-02-20: 4 mg via INTRAVENOUS

## 2017-02-20 MED ORDER — MEPERIDINE HCL 100 MG/ML IJ SOLN
INTRAMUSCULAR | Status: DC | PRN
  Administered 2017-02-20: 50 mg via INTRAVENOUS
  Administered 2017-02-20: 25 mg via INTRAVENOUS

## 2017-02-20 MED ORDER — ONDANSETRON HCL 4 MG/2ML IJ SOLN
INTRAMUSCULAR | Status: AC
  Filled 2017-02-20: qty 2

## 2017-02-20 NOTE — Discharge Instructions (Signed)
°Colonoscopy °Discharge Instructions ° °Read the instructions outlined below and refer to this sheet in the next few weeks. These discharge instructions provide you with general information on caring for yourself after you leave the hospital. Your doctor may also give you specific instructions. While your treatment has been planned according to the most current medical practices available, unavoidable complications occasionally occur. If you have any problems or questions after discharge, call Dr. Rourk at [phone]. °ACTIVITY °· You may resume your regular activity, but move at a slower pace for the next 24 hours.  °· Take frequent rest periods for the next 24 hours.  °· Walking will help get rid of the air and reduce the bloated feeling in your belly (abdomen).  °· No driving for 24 hours (because of the medicine (anesthesia) used during the test).   °· Do not sign any important legal documents or operate any machinery for 24 hours (because of the anesthesia used during the test).  °NUTRITION °· Drink plenty of fluids.  °· You may resume your normal diet as instructed by your doctor.  °· Begin with a light meal and progress to your normal diet. Heavy or fried foods are harder to digest and may make you feel sick to your stomach (nauseated).  °· Avoid alcoholic beverages for 24 hours or as instructed.  °MEDICATIONS °· You may resume your normal medications unless your doctor tells you otherwise.  °WHAT YOU CAN EXPECT TODAY °· Some feelings of bloating in the abdomen.  °· Passage of more gas than usual.  °· Spotting of blood in your stool or on the toilet paper.  °IF YOU HAD POLYPS REMOVED DURING THE COLONOSCOPY: °· No aspirin products for 7 days or as instructed.  °· No alcohol for 7 days or as instructed.  °· Eat a soft diet for the next 24 hours.  °FINDING OUT THE RESULTS OF YOUR TEST °Not all test results are available during your visit. If your test results are not back during the visit, make an appointment  with your caregiver to find out the results. Do not assume everything is normal if you have not heard from your caregiver or the medical facility. It is important for you to follow up on all of your test results.  °SEEK IMMEDIATE MEDICAL ATTENTION IF: °· You have more than a spotting of blood in your stool.  °· Your belly is swollen (abdominal distention).  °· You are nauseated or vomiting.  °· You have a temperature over 101.  °· You have abdominal pain or discomfort that is severe or gets worse throughout the day.  ° ° °Colon polyp information provided ° °Further recommendations to follow pending review of pathology report. ° ° °Colon Polyps °Polyps are tissue growths inside the body. Polyps can grow in many places, including the large intestine (colon). A polyp may be a round bump or a mushroom-shaped growth. You could have one polyp or several. °Most colon polyps are noncancerous (benign). However, some colon polyps can become cancerous over time. °What are the causes? °The exact cause of colon polyps is not known. °What increases the risk? °This condition is more likely to develop in people who: °· Have a family history of colon cancer or colon polyps. °· Are older than 50 or older than 45 if they are African American. °· Have inflammatory bowel disease, such as ulcerative colitis or Crohn disease. °· Are overweight. °· Smoke cigarettes. °· Do not get enough exercise. °· Drink too much alcohol. °·   Eat a diet that is: °? High in fat and red meat. °? Low in fiber. °· Had childhood cancer that was treated with abdominal radiation. ° °What are the signs or symptoms? °Most polyps do not cause symptoms. If you have symptoms, they may include: °· Blood coming from your rectum when having a bowel movement. °· Blood in your stool. The stool may look dark red or black. °· A change in bowel habits, such as constipation or diarrhea. ° °How is this diagnosed? °This condition is diagnosed with a colonoscopy. This is a  procedure that uses a lighted, flexible scope to look at the inside of your colon. °How is this treated? °Treatment for this condition involves removing any polyps that are found. Those polyps will then be tested for cancer. If cancer is found, your health care provider will talk to you about options for colon cancer treatment. °Follow these instructions at home: °Diet °· Eat plenty of fiber, such as fruits, vegetables, and whole grains. °· Eat foods that are high in calcium and vitamin D, such as milk, cheese, yogurt, eggs, liver, fish, and broccoli. °· Limit foods high in fat, red meats, and processed meats, such as hot dogs, sausage, bacon, and lunch meats. °· Maintain a healthy weight, or lose weight if recommended by your health care provider. °General instructions °· Do not smoke cigarettes. °· Do not drink alcohol excessively. °· Keep all follow-up visits as told by your health care provider. This is important. This includes keeping regularly scheduled colonoscopies. Talk to your health care provider about when you need a colonoscopy. °· Exercise every day or as told by your health care provider. °Contact a health care provider if: °· You have new or worsening bleeding during a bowel movement. °· You have new or increased blood in your stool. °· You have a change in bowel habits. °· You unexpectedly lose weight. °This information is not intended to replace advice given to you by your health care provider. Make sure you discuss any questions you have with your health care provider. °Document Released: 10/05/2003 Document Revised: 06/16/2015 Document Reviewed: 11/29/2014 °Elsevier Interactive Patient Education © 2018 Elsevier Inc. ° °

## 2017-02-20 NOTE — H&P (Signed)
@LOGO @   Primary Care Physician:  Mikey Kirschner, MD Primary Gastroenterologist:  Dr. Gala Romney  Pre-Procedure History & Physical: HPI:  Kyle Dunlap is a 72 y.o. male here for surveillance colonoscopy. Multiple colonic adenomas removed 3 years ago.  Positive family history of colon cancer  In patient's brother.  Past Medical History:  Diagnosis Date  . Adenomatous colon polyp 11/2008   Dr. Gala Romney  . Allergy   . Chronic back pain   . DDD (degenerative disc disease), lumbar   . Hemorrhoids, external   . High cholesterol   . HTN (hypertension)   . Hyperplastic colon polyp 11/2008   Dr. Gala Romney  . Kidney stone    "14 years ago" per pt  . Meniere's disease   . Tachyarrhythmia   . Type 2 diabetes mellitus ([place])     Past Surgical History:  Procedure Laterality Date  . CATARACT EXTRACTION W/PHACO Left 09/28/2016   Procedure: CATARACT EXTRACTION PHACO AND INTRAOCULAR LENS PLACEMENT (IOC);  Surgeon: Baruch Goldmann, MD;  Location: [facility];  Service: Ophthalmology;  Laterality: Left;  CDE: 4.40  . CATARACT EXTRACTION W/PHACO Right 10/26/2016   Procedure: CATARACT EXTRACTION PHACO AND INTRAOCULAR LENS PLACEMENT RIGHT EYE;  Surgeon: Baruch Goldmann, MD;  Location: [facility];  Service: Ophthalmology;  Laterality: Right;  CDE: 6.74  . CHOLECYSTECTOMY    . COLONOSCOPY  12/10/2008   Dr. Gala Romney- L side diverticula, adenomatous polyp, hyperplastic polyp  . COLONOSCOPY  11/16/2003   Dr. Laural Golden- performed exam to cecum,3 polyps- no path report available, external hemorrhoids,   . COLONOSCOPY N/A 01/04/2014   Procedure: COLONOSCOPY;  Surgeon: Daneil Dolin, MD;  Location: [facility];  Service: Endoscopy;  Laterality: N/A;  8:30am  . ESOPHAGOGASTRODUODENOSCOPY  08/31/2011   PPI:RJJOAC esophageal erosions consistent with mild erosive reflux esophagitis  . MOUTH SURGERY      Prior to Admission medications   Medication Sig Start Date End Date Taking? Authorizing Provider  acetaminophen (TYLENOL) 325  MG tablet Take 325 mg by mouth 2 (two) times daily as needed for mild pain or headache.    Yes [provider]  Ascorbic Acid (VITAMIN C) 1000 MG tablet Take 1,000 mg by mouth 2 (two) times daily.    Yes [provider]  aspirin 81 MG tablet Take 81 mg by mouth daily.   Yes [provider]  diazepam (VALIUM) 2 MG tablet Take 2 mg by mouth at bedtime as needed (for dizziness).    Yes [provider]  diltiazem (TIAZAC) 360 MG 24 hr capsule Take 1 capsule (360 mg total) by mouth daily. 09/11/16  Yes Mikey Kirschner, MD  enalapril (VASOTEC) 5 MG tablet Take 1 tablet (5 mg total) by mouth at bedtime. 09/11/16  Yes Mikey Kirschner, MD  glipiZIDE (GLUCOTROL) 5 MG tablet Take 1 tablet (5 mg total) by mouth 2 (two) times daily before a meal. 12/26/16  Yes Mikey Kirschner, MD  ibuprofen (ADVIL,MOTRIN) 200 MG tablet Take 200 mg by mouth every 6 (six) hours as needed for headache or mild pain.    Yes [provider]  MEGARED OMEGA-3 KRILL OIL 500 MG CAPS Take 500 mg by mouth daily.    Yes [provider]  Multiple Vitamin (MULTIVITAMIN WITH MINERALS) TABS Take 1 tablet by mouth daily.   Yes [provider]  pantoprazole (PROTONIX) 40 MG tablet Take 1 tablet (40 mg total) by mouth daily. 09/11/16  Yes Mikey Kirschner, MD  triamterene-hydrochlorothiazide (DYAZIDE) 37.5-25  MG capsule TAKE 1 EACH (1 CAPSULE TOTAL) BY MOUTH DAILY. 09/11/16  Yes Mikey Kirschner, MD  polyethylene glycol-electrolytes (TRILYTE) 420 g solution Take 4,000 mLs by mouth as directed. 01/28/17   Annitta Needs, NP    Allergies as of 01/28/2017 - Review Complete 01/28/2017  Allergen Reaction Noted  . Lipitor [atorvastatin]  07/11/2012  . Zetia [ezetimibe]  07/11/2012  . Zocor [simvastatin]  07/11/2012  . Codeine Other (See Comments) 08/03/2011    Family History  Problem Relation Age of Onset  . Heart attack Father     Social History   Socioeconomic History  .  Marital status: Married    Spouse name: Not on file  . Number of children: Not on file  . Years of education: Not on file  . Highest education level: Not on file  Social Needs  . Financial resource strain: Not on file  . Food insecurity - worry: Not on file  . Food insecurity - inability: Not on file  . Transportation needs - medical: Not on file  . Transportation needs - non-medical: Not on file  Occupational History  . Not on file  Tobacco Use  . Smoking status: Former Research scientist (life sciences)  . Smokeless tobacco: Former Systems developer    Quit date: 07/14/1985  Substance and Sexual Activity  . Alcohol use: No  . Drug use: No  . Sexual activity: Yes    Birth control/protection: None  Other Topics Concern  . Not on file  Social History Narrative  . Not on file    Review of Systems: See HPI, otherwise negative ROS  Physical Exam: There were no vitals taken for this visit. General:   Alert,  Well-developed, well-nourished, pleasant and cooperative in NAD Neck:  Supple; no masses or thyromegaly. No significant cervical adenopathy. Lungs:  Clear throughout to auscultation.   No wheezes, crackles, or rhonchi. No acute distress. Heart:  Regular rate and rhythm; no murmurs, clicks, rubs,  or gallops. Abdomen: Non-distended, normal bowel sounds.  Soft and nontender without appreciable mass or hepatosplenomegaly.  Pulses:  Normal pulses noted. Extremities:  Without clubbing or edema.  Impression:  72 year old gentleman with a history of colonic adenoma and a positive family history of colon cancer.  Recommendations:  I have offered the patient a surveillance colonoscopy today.  The risks, benefits, limitations, alternatives and imponderables have been reviewed with the patient. Questions have been answered. All parties are agreeable.         Notice: This dictation was prepared with Dragon dictation along with smaller phrase technology. Any transcriptional errors that result from this process are  unintentional and may not be corrected upon review.

## 2017-02-20 NOTE — Op Note (Signed)
[facility] Patient Name: Kyle Dunlap Procedure Date: 02/20/2017 10:53 AM MRN: [id number] Date of Birth: 04/03/1945 Attending MD: Norvel Richards , MD CSN: [id number] Age: 72 Admit Type: Outpatient Procedure:                Colonoscopy Indications:              High risk colon cancer surveillance: Personal                            history of colonic polyps Providers:                Norvel Richards, MD, Janeece Riggers, RN, Nelma Rothman, Technician Referring MD:              Medicines:                Midazolam 5 mg IV, Meperidine 75 mg IV, Ondansetron                            2 mg IV Complications:            No immediate complications. Estimated Blood Loss:     Estimated blood loss was minimal. Procedure:                Pre-Anesthesia Assessment:                           - Prior to the procedure, a History and Physical                            was performed, and patient medications and                            allergies were reviewed. The patient's tolerance of                            previous anesthesia was also reviewed. The risks                            and benefits of the procedure and the sedation                            options and risks were discussed with the patient.                            All questions were answered, and informed consent                            was obtained. Prior Anticoagulants: The patient has                            taken no previous anticoagulant or antiplatelet  agents. ASA Grade Assessment: II - A patient with                            mild systemic disease. After reviewing the risks                            and benefits, the patient was deemed in                            satisfactory condition to undergo the procedure.                           After obtaining informed consent, the colonoscope                            was passed under direct vision.  Throughout the                            procedure, the patient's blood pressure, pulse, and                            oxygen saturations were monitored continuously. The                            EC-[id number] ([id number]) scope was introduced through                            the anus and advanced to the the cecum, identified                            by appendiceal orifice and ileocecal valve. The                            colonoscopy was performed without difficulty. The                            patient tolerated the procedure well. The quality                            of the bowel preparation was adequate. The                            ileocecal valve, appendiceal orifice, and rectum                            were photographed. The entire colon was well                            visualized. Scope In: 11:14:11 AM Scope Out: 11:53:31 AM Scope Withdrawal Time: 0 hours 25 minutes 13 seconds  Total Procedure Duration: 0 hours 39 minutes 20 seconds  Findings:      The perianal and digital rectal examinations were normal.      multiple colonic polyps were found in the cecal, ascending, transverse  and descending segments. Size range from 3 mm to 9 mm; sessile and       pedunculated (approximately 10 total). Multiple hot and cold snare       polypectomies performed. All polyps seen were removed completely. Impression:               - Multiple colonic polyps?"removed as described                            above. Moderate Sedation:      Moderate (conscious) sedation was administered by the endoscopy nurse       and supervised by the endoscopist. The following parameters were       monitored: oxygen saturation, heart rate, blood pressure, respiratory       rate, EKG, adequacy of pulmonary ventilation, and response to care.       Total physician intraservice time was 45 minutes. Recommendation:           - Patient has a contact number available for                             emergencies. The signs and symptoms of potential                            delayed complications were discussed with the                            patient. Return to normal activities tomorrow.                            Written discharge instructions were provided to the                            patient.                           - Advance diet as tolerated.                           - Continue present medications.                           - Repeat colonoscopy date to be determined after                            pending pathology results are reviewed for                            surveillance based on pathology results.                           - Return to GI clinic (date not yet determined). Procedure Code(s):        --- Professional ---                           [phone], Colonoscopy, flexible; diagnostic, including  collection of specimen(s) by brushing or washing,                            when performed (separate procedure)                           99152, Moderate sedation services provided by the                            same physician or other qualified health care                            professional performing the diagnostic or                            therapeutic service that the sedation supports,                            requiring the presence of an independent trained                            observer to assist in the monitoring of the                            patient's level of consciousness and physiological                            status; initial 15 minutes of intraservice time,                            patient age 80 years or older                           [phone], Moderate sedation services; each additional                            15 minutes intraservice time                           99153, Moderate sedation services; each additional                            15 minutes intraservice time Diagnosis Code(s):         --- Professional ---                           Z86.010, Personal history of colonic polyps CPT copyright 2016 American Medical Association. All rights reserved. The codes documented in this report are preliminary and upon coder review may  be revised to meet current compliance requirements. Cristopher Estimable. Fleta Borgeson, MD Norvel Richards, MD 02/20/2017 1:06:00 PM This report has been signed electronically. Number of Addenda: 0

## 2017-02-22 ENCOUNTER — Encounter (HOSPITAL_COMMUNITY): Payer: Self-pay | Admitting: Internal Medicine

## 2017-02-25 ENCOUNTER — Encounter: Payer: Self-pay | Admitting: Internal Medicine

## 2017-02-25 ENCOUNTER — Other Ambulatory Visit: Payer: Self-pay | Admitting: Family Medicine

## 2017-02-25 NOTE — Telephone Encounter (Signed)
One mo only six mo visit due

## 2017-03-07 ENCOUNTER — Other Ambulatory Visit: Payer: Self-pay | Admitting: *Deleted

## 2017-03-07 MED ORDER — PANTOPRAZOLE SODIUM 40 MG PO TBEC: 40.0000 mg | DELAYED_RELEASE_TABLET | Freq: Every day | ORAL | 0 refills | Status: DC

## 2017-03-14 ENCOUNTER — Encounter: Payer: Self-pay | Admitting: Family Medicine

## 2017-03-14 ENCOUNTER — Ambulatory Visit: Payer: Medicare Other | Admitting: Family Medicine

## 2017-03-14 VITALS — BP 145/78 | Ht 70.0 in | Wt 223.0 lb

## 2017-03-14 DIAGNOSIS — Z79899 Other long term (current) drug therapy: Secondary | ICD-10-CM

## 2017-03-14 DIAGNOSIS — H8103 Meniere's disease, bilateral: Secondary | ICD-10-CM | POA: Diagnosis not present

## 2017-03-14 DIAGNOSIS — E119 Type 2 diabetes mellitus without complications: Secondary | ICD-10-CM | POA: Diagnosis not present

## 2017-03-14 DIAGNOSIS — E785 Hyperlipidemia, unspecified: Secondary | ICD-10-CM

## 2017-03-14 DIAGNOSIS — Z125 Encounter for screening for malignant neoplasm of prostate: Secondary | ICD-10-CM

## 2017-03-14 DIAGNOSIS — I10 Essential (primary) hypertension: Secondary | ICD-10-CM | POA: Diagnosis not present

## 2017-03-14 LAB — POCT GLYCOSYLATED HEMOGLOBIN (HGB A1C): HEMOGLOBIN A1C: 8.1

## 2017-03-14 NOTE — Progress Notes (Signed)
   Subjective:    Patient ID: Kyle Dunlap, male    DOB: [birth year], 72 y.o.   MRN: [id number]  HPI Patient is here today to follow up on Dm. Currently taking Glipizide 5 mg one po BID.Needs new Rx for meter and strips.He eats healthy and does not exercise much.Does see an eye Dr.Rosen in [place]. Does not see podiatry.  Patient claims compliance with diabetes medication. No obvious side effects. Reports no substantial low sugar spells. Most numbers are generally in good range when checked fasting. Generally does not miss a dose of medication. Watching diabetic diet closely  Results for orders placed or performed during the hospital encounter of 02/20/17  Glucose, capillary  Result Value Ref Range   Glucose-Capillary 177 (H) 65 - 99 mg/dL    Blood pressure medicine and blood pressure levels reviewed today with patient. Compliant with blood pressure medicine. States does not miss a dose. No obvious side effects. Blood pressure generally good when checked elsewhere. Watching salt intake.  overalll dit has been decent    Pos menieres disease.  Ongoing challenge.  With medications patient states symptoms are stable.  Not exercising lately  1.  Hyperlipidemia with patient unable to take medications   Review of Systems No headache, no major weight loss or weight gain, no chest pain no back pain abdominal pain no change in bowel habits complete ROS otherwise negative     Objective:   Physical Exam  Alert and oriented, vitals reviewed and stable, NAD ENT-TM's and ext canals WNL bilat via otoscopic exam Soft palate, tonsils and post pharynx WNL via oropharyngeal exam Neck-symmetric, no masses; thyroid nonpalpable and nontender Pulmonary-no tachypnea or accessory muscle use; Clear without wheezes via auscultation Card--no abnrml murmurs, rhythm reg and rate WNL Carotid pulses symmetric, without bruits       Assessment & Plan:   Impression diabetes.  Control not good.   Discussed.  Offered patient options.  He would prefer to hold off on another medication work harder on regular exercise and diet and recheck in 3 months.  If no significant improvement then will definitely press on with further medication  2.  Hyperlipidemia discussed diet discussed prior blood work reviewed  3.  Mnire's disease clinically stable discussed to maintain same meds with  4.  Hypertension good control discussed maintain same meds  Recheck this in 3 months A1c then needs to be under 7.5

## 2017-03-15 LAB — MICROALBUMIN / CREATININE URINE RATIO
CREATININE, UR: 246.9 mg/dL
MICROALB/CREAT RATIO: 62.9 mg/g{creat} — AB (ref 0.0–30.0)
MICROALBUM., U, RANDOM: 155.4 ug/mL

## 2017-03-15 LAB — BASIC METABOLIC PANEL
BUN/Creatinine Ratio: 13 (ref 10–24)
BUN: 21 mg/dL (ref 8–27)
CALCIUM: 9.8 mg/dL (ref 8.6–10.2)
CHLORIDE: 97 mmol/L (ref 96–106)
CO2: 21 mmol/L (ref 20–29)
Creatinine, Ser: 1.61 mg/dL — ABNORMAL HIGH (ref 0.76–1.27)
GFR calc non Af Amer: 42 mL/min/{1.73_m2} — ABNORMAL LOW (ref >59)
GFR, EST AFRICAN AMERICAN: 48 mL/min/{1.73_m2} — AB (ref >59)
Glucose: 200 mg/dL — ABNORMAL HIGH (ref 65–99)
Potassium: 4.3 mmol/L (ref 3.5–5.2)
Sodium: 136 mmol/L (ref 134–144)

## 2017-03-15 LAB — LIPID PANEL
CHOL/HDL RATIO: 7.7 ratio — AB (ref 0.0–5.0)
Cholesterol, Total: 285 mg/dL — ABNORMAL HIGH (ref 100–199)
HDL: 37 mg/dL — AB (ref >39)
LDL Calculated: 191 mg/dL — ABNORMAL HIGH (ref 0–99)
Triglycerides: 286 mg/dL — ABNORMAL HIGH (ref 0–149)
VLDL Cholesterol Cal: 57 mg/dL — ABNORMAL HIGH (ref 5–40)

## 2017-03-15 LAB — HEPATIC FUNCTION PANEL
ALBUMIN: 4.6 g/dL (ref 3.5–4.8)
ALT: 50 IU/L — AB (ref 0–44)
AST: 27 IU/L (ref 0–40)
Alkaline Phosphatase: 112 IU/L (ref 39–117)
Bilirubin Total: 0.5 mg/dL (ref 0.0–1.2)
Bilirubin, Direct: 0.13 mg/dL (ref 0.00–0.40)
Total Protein: 7.6 g/dL (ref 6.0–8.5)

## 2017-03-15 LAB — PSA: PROSTATE SPECIFIC AG, SERUM: 1.9 ng/mL (ref 0.0–4.0)

## 2017-03-18 ENCOUNTER — Encounter: Payer: Self-pay | Admitting: Family Medicine

## 2017-03-29 ENCOUNTER — Other Ambulatory Visit: Payer: Self-pay | Admitting: Family Medicine

## 2017-04-22 ENCOUNTER — Other Ambulatory Visit: Payer: Self-pay | Admitting: Family Medicine

## 2017-05-06 LAB — HM DIABETES EYE EXAM

## 2017-05-07 NOTE — Telephone Encounter (Signed)
Refused at this refill patient will need to contact our office for further refills

## 2017-05-30 ENCOUNTER — Other Ambulatory Visit: Payer: Self-pay | Admitting: Family Medicine

## 2017-06-09 ENCOUNTER — Other Ambulatory Visit: Payer: Self-pay | Admitting: Family Medicine

## 2017-06-11 ENCOUNTER — Ambulatory Visit: Payer: Medicare Other | Admitting: Family Medicine

## 2017-06-11 ENCOUNTER — Encounter: Payer: Self-pay | Admitting: Family Medicine

## 2017-06-11 VITALS — BP 142/86 | Ht 70.0 in | Wt 219.6 lb

## 2017-06-11 DIAGNOSIS — E119 Type 2 diabetes mellitus without complications: Secondary | ICD-10-CM

## 2017-06-11 DIAGNOSIS — H8103 Meniere's disease, bilateral: Secondary | ICD-10-CM

## 2017-06-11 DIAGNOSIS — I10 Essential (primary) hypertension: Secondary | ICD-10-CM

## 2017-06-11 LAB — POCT GLYCOSYLATED HEMOGLOBIN (HGB A1C): HEMOGLOBIN A1C: 7.4 % — AB (ref 4.0–5.6)

## 2017-06-11 MED ORDER — PANTOPRAZOLE SODIUM 40 MG PO TBEC: 40.0000 mg | DELAYED_RELEASE_TABLET | Freq: Every day | ORAL | 1 refills | Status: DC

## 2017-06-11 MED ORDER — GLIPIZIDE 5 MG PO TABS: 5.0000 mg | ORAL_TABLET | Freq: Two times a day (BID) | ORAL | 1 refills | Status: DC

## 2017-06-11 MED ORDER — DIAZEPAM 2 MG PO TABS: 2.0000 mg | ORAL_TABLET | Freq: Every evening | ORAL | 5 refills | Status: DC | PRN

## 2017-06-11 MED ORDER — DILTIAZEM HCL ER BEADS 360 MG PO CP24: 360.0000 mg | ORAL_CAPSULE | Freq: Every day | ORAL | 1 refills | Status: DC

## 2017-06-11 MED ORDER — TRIAMTERENE-HCTZ 37.5-25 MG PO CAPS: ORAL_CAPSULE | ORAL | 1 refills | Status: DC

## 2017-06-11 NOTE — Progress Notes (Signed)
   Subjective:    Patient ID: Kyle Dunlap, male    DOB: [birth year], 72 y.o.   MRN: [id number]  Diabetes  He presents for his follow-up diabetic visit. He has type 2 diabetes mellitus. Risk factors for coronary artery disease include hypertension. Current diabetic treatment includes oral agent (monotherapy). He is compliant with treatment all of the time. His weight is stable. He is following a diabetic diet. He has not had a previous visit with a dietitian. He does not see a podiatrist.Eye exam is current.   Results for orders placed or performed in visit on 06/11/17  POCT glycosylated hemoglobin (Hb A1C)  Result Value Ref Range   Hemoglobin A1C 7.4 (A) 4.0 - 5.6 %   HbA1c, POC (prediabetic range)  5.7 - 6.4 %   HbA1c, POC (controlled diabetic range)  0.0 - 7.0 %  HM DIABETES EYE EXAM  Result Value Ref Range   HM Diabetic Eye Exam No Retinopathy No Retinopathy    Blood pressure medicine and blood pressure levels reviewed today with patient. Compliant with blood pressure medicine. States does not miss a dose. No obvious side effects. Blood pressure generally good when checked elsewhere. Watching salt intake.  Patient claims compliance with diabetes medication. No obvious side effects. Reports no substantial low sugar spells. Most numbers are generally in good range when checked fasting. Generally does not miss a dose of medication. Watching diabetic diet closely  Overall Mnire's disease is stable.  Compliant with medication for it.  Occasional flares.  Patient utilizes diazepam for this also for insomnia purposes.  States symptoms are managed   Diet watching closely  Walking not much, still having some bk discomfort, doing p t has helped the back   Review of Systems No headache, no major weight loss or weight gain, no chest pain no back pain abdominal pain no change in bowel habits complete ROS otherwise negative     Objective:   Physical Exam  Alert and oriented, vitals  reviewed and stable, NAD ENT-TM's and ext canals WNL bilat via otoscopic exam Soft palate, tonsils and post pharynx WNL via oropharyngeal exam Neck-symmetric, no masses; thyroid nonpalpable and nontender Pulmonary-no tachypnea or accessory muscle use; Clear without wheezes via auscultation Card--no abnrml murmurs, rhythm reg and rate WNL Carotid pulses symmetric, without bruits       Assessment & Plan:  1 impression type 2 diabetes.  Good control.  A1c discussed.  7.4 considered good for this age.  Diet discussed compliance discussed  2.  Hypertension good control discussed maintain same compliance discussed  3.  Mnire's disease clinically stable.  Maintain current therapy  4.  Hyperlipidemia.  Ongoing.  Unable to take any medication  Follow-up as

## 2017-09-18 ENCOUNTER — Other Ambulatory Visit: Payer: Self-pay | Admitting: Family Medicine

## 2017-10-04 ENCOUNTER — Other Ambulatory Visit: Payer: Self-pay | Admitting: Family Medicine

## 2017-10-23 ENCOUNTER — Ambulatory Visit (INDEPENDENT_AMBULATORY_CARE_PROVIDER_SITE_OTHER): Payer: Medicare Other | Admitting: *Deleted

## 2017-10-23 DIAGNOSIS — Z23 Encounter for immunization: Secondary | ICD-10-CM

## 2017-12-10 ENCOUNTER — Other Ambulatory Visit: Payer: Self-pay | Admitting: Family Medicine

## 2017-12-10 ENCOUNTER — Ambulatory Visit: Payer: Medicare Other | Admitting: Family Medicine

## 2017-12-10 ENCOUNTER — Encounter: Payer: Self-pay | Admitting: Family Medicine

## 2017-12-10 VITALS — BP 142/80 | Ht 67.25 in | Wt 218.0 lb

## 2017-12-10 DIAGNOSIS — H8103 Meniere's disease, bilateral: Secondary | ICD-10-CM | POA: Diagnosis not present

## 2017-12-10 DIAGNOSIS — E119 Type 2 diabetes mellitus without complications: Secondary | ICD-10-CM | POA: Diagnosis not present

## 2017-12-10 DIAGNOSIS — I10 Essential (primary) hypertension: Secondary | ICD-10-CM

## 2017-12-10 DIAGNOSIS — E785 Hyperlipidemia, unspecified: Secondary | ICD-10-CM

## 2017-12-10 DIAGNOSIS — Z0000 Encounter for general adult medical examination without abnormal findings: Secondary | ICD-10-CM

## 2017-12-10 LAB — POCT GLYCOSYLATED HEMOGLOBIN (HGB A1C): Hemoglobin A1C: 8.7 % — AB (ref 4.0–5.6)

## 2017-12-10 MED ORDER — TRIAMTERENE-HCTZ 37.5-25 MG PO CAPS: ORAL_CAPSULE | ORAL | 1 refills | Status: DC

## 2017-12-10 MED ORDER — GLIPIZIDE 5 MG PO TABS: 5.0000 mg | ORAL_TABLET | Freq: Two times a day (BID) | ORAL | 1 refills | Status: DC

## 2017-12-10 MED ORDER — ENALAPRIL MALEATE 5 MG PO TABS: 5.0000 mg | ORAL_TABLET | Freq: Every day | ORAL | 1 refills | Status: DC

## 2017-12-10 MED ORDER — DILTIAZEM HCL ER BEADS 360 MG PO CP24: 360.0000 mg | ORAL_CAPSULE | Freq: Every day | ORAL | 1 refills | Status: DC

## 2017-12-10 MED ORDER — DIAZEPAM 2 MG PO TABS: 2.0000 mg | ORAL_TABLET | Freq: Every evening | ORAL | 5 refills | Status: DC | PRN

## 2017-12-10 MED ORDER — PANTOPRAZOLE SODIUM 40 MG PO TBEC: 40.0000 mg | DELAYED_RELEASE_TABLET | Freq: Every day | ORAL | 1 refills | Status: DC

## 2017-12-10 MED ORDER — GLIPIZIDE 5 MG PO TABS: ORAL_TABLET | ORAL | 1 refills | Status: DC

## 2017-12-10 NOTE — Progress Notes (Signed)
Subjective:    Patient ID: Kyle Dunlap, male    DOB: 01-13-1946, 72 y.o.   MRN: [id number]  HPI AWV- Annual Wellness Visit  The patient was seen for their annual wellness visit. The patient's past medical history, surgical history, and family history were reviewed. Pertinent vaccines were reviewed ( tetanus, pneumonia, shingles, flu) The patient's medication list was reviewed and updated.  The height and weight were entered.  BMI recorded in electronic record elsewhere  Cognitive screening was completed. Outcome of Mini - Cog: pass   Falls /depression screening electronically recorded within record elsewhere  Current tobacco usage: none (All patients who use tobacco were given written and verbal information on quitting)  Recent listing of emergency department/hospitalizations over the past year were reviewed.  current specialist the patient sees on a regular basis: none   Medicare annual wellness visit patient questionnaire was reviewed.  A written screening schedule for the patient for the next 5-10 years was given. Appropriate discussion of followup regarding next visit was discussed.  Results for orders placed or performed in visit on 12/10/17  POCT glycosylated hemoglobin (Hb A1C)  Result Value Ref Range   Hemoglobin A1C 8.7 (A) 4.0 - 5.6 %   HbA1c POC (<> result, manual entry)     HbA1c, POC (prediabetic range)     HbA1c, POC (controlled diabetic range)      Blood pressure medicine and blood pressure levels reviewed today with patient. Compliant with blood pressure medicine. States does not miss a dose. No obvious side effects. Blood pressure generally good when checked elsewhere. Watching salt intake.  Patient claims compliance with diabetes medication. No obvious side effects. Reports no substantial low sugar spells. Most numbers are generally in good range when checked fasting. Generally does not miss a dose of medication. Watching diabetic diet closely  dizziness,  Ongoing challenges with dizziness.  Definitely needs to Valium in order to help control it.  History of known dizziness associated with Mnire's disease.  In fact lead to early disability for patient.   Glu has been rising, thinks its his eating habits   Review of Systems  Constitutional: Negative for activity change, appetite change and fever.  HENT: Negative for congestion and rhinorrhea.   Eyes: Negative for discharge.  Respiratory: Negative for cough and wheezing.   Cardiovascular: Negative for chest pain.  Gastrointestinal: Negative for abdominal pain, blood in stool and vomiting.  Genitourinary: Negative for difficulty urinating and frequency.  Musculoskeletal: Negative for neck pain.  Skin: Negative for rash.  Allergic/Immunologic: Negative for environmental allergies and food allergies.  Neurological: Negative for weakness and headaches.  Psychiatric/Behavioral: Negative for agitation.  All other systems reviewed and are negative.      Objective:   Physical Exam  Constitutional: He appears well-developed and well-nourished.  HENT:  Head: Normocephalic and atraumatic.  Right Ear: External ear normal.  Left Ear: External ear normal.  Nose: Nose normal.  Mouth/Throat: Oropharynx is clear and moist.  Eyes: Pupils are equal, round, and reactive to light. EOM are normal.  Neck: Normal range of motion. Neck supple. No thyromegaly present.  Cardiovascular: Normal rate, regular rhythm and normal heart sounds.  No murmur heard. Pulmonary/Chest: Effort normal and breath sounds normal. No respiratory distress. He has no wheezes.  Abdominal: Soft. Bowel sounds are normal. He exhibits no distension and no mass. There is no tenderness.  Genitourinary: Penis normal.  Musculoskeletal: Normal range of motion. He exhibits no edema.  Lymphadenopathy:    He has  no cervical adenopathy.  Neurological: He is alert. He exhibits normal muscle tone.  Skin: Skin is warm and dry.  No erythema.  Psychiatric: He has a normal mood and affect. His behavior is normal. Judgment normal.  Vitals reviewed.         Assessment & Plan:  Impression wellness exam.  Diet discussed.  Exercise discussed.  Colonoscopy up-to-date.  Colon done in 2019.  Vaccines discussed and administered were appropriate  2.  Type 2 diabetes.  Suboptimal control.  Discussed.  Increase Capozide dose rationale discussed.  3.  Hypertension.  Good control discussed maintain same meds.  #4 Mnire's disease.  Ongoing need for controlled substances discussed.  Proper use discussed side effects benefits discussed   Follow-up in 3 months.

## 2018-02-17 ENCOUNTER — Encounter: Payer: Self-pay | Admitting: Family Medicine

## 2018-02-17 LAB — HM DIABETES EYE EXAM

## 2018-03-10 ENCOUNTER — Ambulatory Visit: Payer: Medicare Other | Admitting: Family Medicine

## 2018-03-10 ENCOUNTER — Encounter: Payer: Self-pay | Admitting: Family Medicine

## 2018-03-10 VITALS — BP 168/78 | Ht 67.25 in | Wt 220.1 lb

## 2018-03-10 DIAGNOSIS — E785 Hyperlipidemia, unspecified: Secondary | ICD-10-CM

## 2018-03-10 DIAGNOSIS — Z125 Encounter for screening for malignant neoplasm of prostate: Secondary | ICD-10-CM

## 2018-03-10 DIAGNOSIS — E119 Type 2 diabetes mellitus without complications: Secondary | ICD-10-CM

## 2018-03-10 DIAGNOSIS — Z79899 Other long term (current) drug therapy: Secondary | ICD-10-CM

## 2018-03-10 DIAGNOSIS — I10 Essential (primary) hypertension: Secondary | ICD-10-CM

## 2018-03-10 LAB — POCT GLYCOSYLATED HEMOGLOBIN (HGB A1C): Hemoglobin A1C: 8 % — AB (ref 4.0–5.6)

## 2018-03-10 MED ORDER — SITAGLIPTIN PHOSPHATE 50 MG PO TABS: 50.0000 mg | ORAL_TABLET | Freq: Every day | ORAL | 5 refills | Status: DC

## 2018-03-10 MED ORDER — ENALAPRIL MALEATE 10 MG PO TABS: 10.0000 mg | ORAL_TABLET | Freq: Every day | ORAL | 5 refills | Status: DC

## 2018-03-10 NOTE — Progress Notes (Signed)
   Subjective:    Patient ID: Kyle Dunlap, male    DOB: March 30, 1945, 73 y.o.   MRN: [id number]  HPI  Patient is here today to follow up on his chronic health issues.  Diabetes:He takes Glipizide 5 mg two bid   Results for orders placed or performed in visit on 03/10/18  POCT glycosylated hemoglobin (Hb A1C)  Result Value Ref Range   Hemoglobin A1C 8.0 (A) 4.0 - 5.6 %   HbA1c POC (<> result, manual entry)     HbA1c, POC (prediabetic range)     HbA1c, POC (controlled diabetic range)       Hypertension: Enalapril 5 mg once per day,diltiazem 360 mg once per day  Hyperlipidemia: Omega fish oil one bid  Gerd: Pantoprazole 40 mg once per day.  He is eating healthy and getting some exercise.  He does see psychologist.  Blood pressure medicine and blood pressure levels reviewed today with patient. Compliant with blood pressure medicine. States does not miss a dose. No obvious side effects. Blood pressure generally good when checked elsewhere. Watching salt intake.   Patient claims compliance with diabetes medication. No obvious side effects. Reports no substantial low sugar spells. Most numbers are generally in good range when checked fasting. Generally does not miss a dose of medication. Watching diabetic diet closely  Ongoing challenges with Mnire's disease.  Valium definitely helps.  Impression 1 hypertension.  Suboptimal control discussed   Review of Systems No headache, no major weight loss or weight gain, no chest pain no back pain abdominal pain no change in bowel habits complete ROS otherwise negative     Objective:   Physical Exam   Alert and oriented, vitals reviewed and stable, NAD ENT-TM's and ext canals WNL bilat via otoscopic exam Soft palate, tonsils and post pharynx WNL via oropharyngeal exam Neck-symmetric, no masses; thyroid nonpalpable and nontender Pulmonary-no tachypnea or accessory muscle use; Clear without wheezes via auscultation Card--no abnrml  murmurs, rhythm reg and rate WNL Carotid pulses symmetric, without bruits      Assessment & Plan:  Impression #1 hypertension.  Suboptimal control.  Discussed.  Will double enalapril rationale discussed  2.  Type 2 diabetes.  Suboptimal control.  Will increase Januvia rationale discussed  3.  Hyperlipidemia compliance with meds discussed  4.  Mnire's disease ongoing  Follow-up as scheduled.  Changes as noted above diet exercise discussed

## 2018-03-11 LAB — LIPID PANEL
CHOL/HDL RATIO: 7.7 ratio — AB (ref 0.0–5.0)
Cholesterol, Total: 284 mg/dL — ABNORMAL HIGH (ref 100–199)
HDL: 37 mg/dL — ABNORMAL LOW (ref >39)
LDL Calculated: 202 mg/dL — ABNORMAL HIGH (ref 0–99)
Triglycerides: 227 mg/dL — ABNORMAL HIGH (ref 0–149)
VLDL Cholesterol Cal: 45 mg/dL — ABNORMAL HIGH (ref 5–40)

## 2018-03-11 LAB — BASIC METABOLIC PANEL
BUN/Creatinine Ratio: 16 (ref 10–24)
BUN: 28 mg/dL — AB (ref 8–27)
CO2: 21 mmol/L (ref 20–29)
Calcium: 9.9 mg/dL (ref 8.6–10.2)
Chloride: 97 mmol/L (ref 96–106)
Creatinine, Ser: 1.73 mg/dL — ABNORMAL HIGH (ref 0.76–1.27)
GFR calc Af Amer: 44 mL/min/{1.73_m2} — ABNORMAL LOW (ref >59)
GFR, EST NON AFRICAN AMERICAN: 38 mL/min/{1.73_m2} — AB (ref >59)
Glucose: 211 mg/dL — ABNORMAL HIGH (ref 65–99)
Potassium: 5.2 mmol/L (ref 3.5–5.2)
Sodium: 134 mmol/L (ref 134–144)

## 2018-03-11 LAB — HEPATIC FUNCTION PANEL
ALBUMIN: 4.5 g/dL (ref 3.7–4.7)
ALT: 38 IU/L (ref 0–44)
AST: 20 IU/L (ref 0–40)
Alkaline Phosphatase: 105 IU/L (ref 39–117)
Bilirubin Total: 0.4 mg/dL (ref 0.0–1.2)
Bilirubin, Direct: 0.13 mg/dL (ref 0.00–0.40)
TOTAL PROTEIN: 7.4 g/dL (ref 6.0–8.5)

## 2018-03-11 LAB — MICROALBUMIN / CREATININE URINE RATIO
CREATININE, UR: 205.5 mg/dL
Microalb/Creat Ratio: 34 mg/g creat — ABNORMAL HIGH (ref 0–29)
Microalbumin, Urine: 69.6 ug/mL

## 2018-03-11 LAB — PSA: Prostate Specific Ag, Serum: 1.8 ng/mL (ref 0.0–4.0)

## 2018-03-12 ENCOUNTER — Encounter: Payer: Self-pay | Admitting: Family Medicine

## 2018-04-03 ENCOUNTER — Encounter: Payer: Self-pay | Admitting: Family Medicine

## 2018-06-05 ENCOUNTER — Other Ambulatory Visit: Payer: Self-pay | Admitting: Family Medicine

## 2018-06-06 NOTE — Telephone Encounter (Signed)
Six mo worth 

## 2018-06-30 ENCOUNTER — Other Ambulatory Visit: Payer: Self-pay | Admitting: Family Medicine

## 2018-07-09 ENCOUNTER — Ambulatory Visit (INDEPENDENT_AMBULATORY_CARE_PROVIDER_SITE_OTHER): Payer: Medicare Other | Admitting: Family Medicine

## 2018-07-09 ENCOUNTER — Other Ambulatory Visit: Payer: Self-pay

## 2018-07-09 DIAGNOSIS — I10 Essential (primary) hypertension: Secondary | ICD-10-CM

## 2018-07-09 DIAGNOSIS — H8103 Meniere's disease, bilateral: Secondary | ICD-10-CM

## 2018-07-09 DIAGNOSIS — M25551 Pain in right hip: Secondary | ICD-10-CM | POA: Diagnosis not present

## 2018-07-09 DIAGNOSIS — E119 Type 2 diabetes mellitus without complications: Secondary | ICD-10-CM | POA: Diagnosis not present

## 2018-07-09 MED ORDER — GLIPIZIDE 5 MG PO TABS: ORAL_TABLET | ORAL | 1 refills | Status: DC

## 2018-07-09 MED ORDER — SITAGLIPTIN PHOSPHATE 50 MG PO TABS: 50.0000 mg | ORAL_TABLET | Freq: Every day | ORAL | 5 refills | Status: DC

## 2018-07-09 MED ORDER — TRIAMTERENE-HCTZ 37.5-25 MG PO CAPS: ORAL_CAPSULE | ORAL | 1 refills | Status: DC

## 2018-07-09 MED ORDER — ENALAPRIL MALEATE 10 MG PO TABS: 10.0000 mg | ORAL_TABLET | Freq: Every day | ORAL | 1 refills | Status: DC

## 2018-07-09 MED ORDER — PANTOPRAZOLE SODIUM 40 MG PO TBEC: 40.0000 mg | DELAYED_RELEASE_TABLET | Freq: Every day | ORAL | 1 refills | Status: DC

## 2018-07-09 MED ORDER — DILTIAZEM HCL ER BEADS 360 MG PO CP24: 360.0000 mg | ORAL_CAPSULE | Freq: Every day | ORAL | 1 refills | Status: DC

## 2018-07-09 NOTE — Progress Notes (Signed)
   Subjective:    Patient ID: Kyle Dunlap, male    DOB: Aug 19, 1945, 73 y.o.   MRN: [id number] Patient calls with numerous concerns  Audio only Diabetes He presents for his follow-up diabetic visit. He has type 2 diabetes mellitus. Risk factors for coronary artery disease include diabetes mellitus, hypertension and dyslipidemia. Current diabetic treatment includes oral agent (dual therapy). He is compliant with treatment all of the time. His weight is stable. He is following a diabetic diet. He has not had a previous visit with a dietitian.    Virtual Visit via Video Note  I connected with Kyle Dunlap on 07/09/18 at  9:00 AM EDT by a video enabled telemedicine application and verified that I am speaking with the correct person using two identifiers.  Location: Patient: home Provider: office   I discussed the limitations of evaluation and management by telemedicine and the availability of in person appointments. The patient expressed understanding and agreed to proceed.  History of Present Illness:    Observations/Objective:   Assessment and Plan:   Follow Up Instructions:    I discussed the assessment and treatment plan with the patient. The patient was provided an opportunity to ask questions and all were answered. The patient agreed with the plan and demonstrated an understanding of the instructions.   The patient was advised to call back or seek an in-person evaluation if the symptoms worsen or if the condition fails to improve as anticipated.  I provided 25 minutes of non-face-to-face time during this encounter.  Patient claims compliance with diabetes medication. No obvious side effects. Reports no substantial low sugar spells. Most numbers are generally in good range when checked fasting. Generally does not miss a dose of medication. Watching diabetic diet closely  Blood pressure medicine and blood pressure levels reviewed today with patient. Compliant with  blood pressure medicine. States does not miss a dose. No obvious side effects. Blood pressure generally good when checked elsewhere. Watching salt intake.  Hip hurting.  Progressive in nature.  6 to 8 months duration.  Father had history of severe hip arthritis.  Affecting ability to do what he wants to do.  Comes and goes worse when on his feet.  Has stopped walking     Review of Systems No headache, no major weight loss or weight gain, no chest pain no back pain abdominal pain no change in bowel habits complete ROS otherwise negative     Objective:   Physical Exam   Virtual     Assessment & Plan:  Impression 1 hypertension good control discussed maintain same meds  2.  Type 2 diabetes.  Glucose is up a bit fasting in the neighborhood of 150 compliant with meds diet discussed not the best.  3.  Mnire's disease with dizziness ongoing need for meds discussed will refill  4.  Progressive hip pain.  Time for x-ray rationale discussed  Follow-up in 6 months diet exercise discussed

## 2018-08-25 ENCOUNTER — Encounter: Payer: Self-pay | Admitting: Family Medicine

## 2018-08-26 ENCOUNTER — Other Ambulatory Visit: Payer: Self-pay

## 2018-08-26 ENCOUNTER — Ambulatory Visit (INDEPENDENT_AMBULATORY_CARE_PROVIDER_SITE_OTHER): Payer: Medicare Other | Admitting: Family Medicine

## 2018-08-26 DIAGNOSIS — L720 Epidermal cyst: Secondary | ICD-10-CM | POA: Diagnosis not present

## 2018-08-26 MED ORDER — CEPHALEXIN 500 MG PO CAPS: ORAL_CAPSULE | ORAL | 0 refills | Status: DC

## 2018-08-26 NOTE — Progress Notes (Signed)
   Subjective:  Audio  Patient ID: Kyle Dunlap, male    DOB: August 31, 1945, 73 y.o.   MRN: [id number]  HPI Pt has cyst on chest. A boil came up around it and was sore. Boil did pop and has been draining about a week. Boil came up about 2 weeks ago. Pt has been putting Neosporin ointment on it.   Virtual Visit via Video Note  I connected with Kyle Dunlap on 08/26/18 at 11:00 AM EDT by a video enabled telemedicine application and verified that I am speaking with the correct person using two identifiers.  Location: Patient: home Provider: office   I discussed the limitations of evaluation and management by telemedicine and the availability of in person appointments. The patient expressed understanding and agreed to proceed.  History of Present Illness:    Observations/Objective:   Assessment and Plan:   Follow Up Instructions:    I discussed the assessment and treatment plan with the patient. The patient was provided an opportunity to ask questions and all were answered. The patient agreed with the plan and demonstrated an understanding of the instructions.   The patient was advised to call back or seek an in-person evaluation if the symptoms worsen or if the condition fails to improve as anticipated.  I provided 18 minutes of non-face-to-face time during this encounter.   Vicente Males, LPN    Review of Systems No headache, no major weight loss or weight gain, no chest pain no back pain abdominal pain no change in bowel habits complete ROS otherwise negative     Objective:   Physical Exam  Virtual      Assessment & Plan:  Impression patient has a longstanding sebaceous cyst.  Has not been a problem.  Last couple weeks is started to swell become tender inflamed.  Now draining discharge the last week.  Would like to get it out.  Educated that he cannot "get it out" right now with it acutely infected.  We will treat with antibiotics and set him up for referral

## 2018-08-26 NOTE — Telephone Encounter (Signed)
Pt was scheduled for appt today with dr Richardson Landry

## 2018-08-27 ENCOUNTER — Encounter: Payer: Self-pay | Admitting: Family Medicine

## 2018-09-02 ENCOUNTER — Encounter: Payer: Self-pay | Admitting: Family Medicine

## 2018-09-30 ENCOUNTER — Encounter: Payer: Self-pay | Admitting: General Surgery

## 2018-09-30 ENCOUNTER — Other Ambulatory Visit: Payer: Self-pay

## 2018-09-30 ENCOUNTER — Ambulatory Visit (INDEPENDENT_AMBULATORY_CARE_PROVIDER_SITE_OTHER): Payer: Medicare Other | Admitting: General Surgery

## 2018-09-30 VITALS — BP 195/75 | HR 75 | Temp 98.4°F | Resp 16 | Ht 69.5 in | Wt 218.0 lb

## 2018-09-30 DIAGNOSIS — L723 Sebaceous cyst: Secondary | ICD-10-CM | POA: Diagnosis not present

## 2018-09-30 NOTE — Progress Notes (Signed)
Kyle Dunlap; [id number]; 11-16-1945   HPI Patient is a 73 year old white male who was referred to my care by Baltazar Apo for evaluation and treatment of of a sebaceous cyst on his chest wall.  Patient states that this started swelling and draining several weeks ago.  It had been present for some time.  He was started on antibiotic.  It has since opened up and drained white cloudy fluid.  He currently has no pain.  Drainage has been limited.  He denies any fever or chills.  He currently has 0 out of 10 pain. Past Medical History:  Diagnosis Date  . Adenomatous colon polyp 11/2008   Dr. Gala Romney  . Allergy   . Chronic back pain   . DDD (degenerative disc disease), lumbar   . Hemorrhoids, external   . High cholesterol   . HTN (hypertension)   . Hyperplastic colon polyp 11/2008   Dr. Gala Romney  . Kidney stone    "14 years ago" per pt  . Meniere's disease   . Tachyarrhythmia   . Type 2 diabetes mellitus ([place])     Past Surgical History:  Procedure Laterality Date  . CATARACT EXTRACTION W/PHACO Left 09/28/2016   Procedure: CATARACT EXTRACTION PHACO AND INTRAOCULAR LENS PLACEMENT (IOC);  Surgeon: Baruch Goldmann, MD;  Location: [facility];  Service: Ophthalmology;  Laterality: Left;  CDE: 4.40  . CATARACT EXTRACTION W/PHACO Right 10/26/2016   Procedure: CATARACT EXTRACTION PHACO AND INTRAOCULAR LENS PLACEMENT RIGHT EYE;  Surgeon: Baruch Goldmann, MD;  Location: [facility];  Service: Ophthalmology;  Laterality: Right;  CDE: 6.74  . CHOLECYSTECTOMY    . COLONOSCOPY  12/10/2008   Dr. Gala Romney- L side diverticula, adenomatous polyp, hyperplastic polyp  . COLONOSCOPY  11/16/2003   Dr. Laural Golden- performed exam to cecum,3 polyps- no path report available, external hemorrhoids,   . COLONOSCOPY N/A 01/04/2014   Procedure: COLONOSCOPY;  Surgeon: Daneil Dolin, MD;  Location: [facility];  Service: Endoscopy;  Laterality: N/A;  8:30am  . COLONOSCOPY N/A 02/20/2017   Procedure: COLONOSCOPY;  Surgeon: Daneil Dolin, MD;  Location: [facility];  Service: Endoscopy;  Laterality: N/A;  12:00  . ESOPHAGOGASTRODUODENOSCOPY  08/31/2011   PJA:[id number] esophageal erosions consistent with mild erosive reflux esophagitis  . MOUTH SURGERY      Family History  Problem Relation Age of Onset  . Heart attack Father     Current Outpatient Medications on File Prior to Visit  Medication Sig Dispense Refill  . acetaminophen (TYLENOL) 325 MG tablet Take 325 mg by mouth 2 (two) times daily as needed for mild pain or headache.     . Ascorbic Acid (VITAMIN C) 1000 MG tablet Take 1,000 mg by mouth 2 (two) times daily.     Marland Kitchen aspirin 81 MG tablet Take 81 mg by mouth daily.    . cephALEXin (KEFLEX) 500 MG capsule Take one capsule po TID for 10 days 30 capsule 0  . diazepam (VALIUM) 2 MG tablet TAKE ONE TABLET (2MG  TOTAL) BY MOUTH AT BEDTIME AS NEEDED FOR DIZZINESS 30 tablet 5  . diltiazem (TIAZAC) 360 MG 24 hr capsule Take 1 capsule (360 mg total) by mouth daily. 90 capsule 1  . enalapril (VASOTEC) 10 MG tablet Take 1 tablet (10 mg total) by mouth daily. 90 tablet 1  . glipiZIDE (GLUCOTROL) 5 MG tablet TAKE 2 TABLETS BY MOUTH TWICE DAILY 360 tablet 1  . ibuprofen (ADVIL,MOTRIN) 200 MG tablet Take 200 mg by mouth every 6 (six) hours  as needed for headache or mild pain.     Marland Kitchen MEGARED OMEGA-3 KRILL OIL 500 MG CAPS Take 500 mg by mouth daily.     . Multiple Vitamin (MULTIVITAMIN WITH MINERALS) TABS Take 1 tablet by mouth daily.    . pantoprazole (PROTONIX) 40 MG tablet Take 1 tablet (40 mg total) by mouth daily. 90 tablet 1  . sitaGLIPtin (JANUVIA) 50 MG tablet Take 1 tablet (50 mg total) by mouth daily. 30 tablet 5  . triamterene-hydrochlorothiazide (DYAZIDE) 37.5-25 MG capsule TAKE 1 EACH (1 CAPSULE TOTAL) BY MOUTH DAILY. 90 capsule 1   No current facility-administered medications on file prior to visit.     Allergies  Allergen Reactions  . Lipitor [Atorvastatin] Other (See Comments)    Joint and muscle aches  . Zetia  [Ezetimibe] Other (See Comments)    Elevated liver enzymes  . Zocor [Simvastatin] Other (See Comments)    Joint and muscle aches  . Codeine Other (See Comments)    Passed out    Social History   Substance and Sexual Activity  Alcohol Use Yes   Comment: occasionally    Social History   Tobacco Use  Smoking Status Former Smoker  Smokeless Tobacco Former Systems developer  . Quit date: 07/14/1985    Review of Systems  Constitutional: Negative.   HENT: Negative.   Eyes: Negative.   Respiratory: Positive for shortness of breath.   Gastrointestinal: Negative.   Genitourinary: Negative.   Musculoskeletal: Positive for back pain.  Skin:       Boils  Neurological: Negative.   Endo/Heme/Allergies: Negative.   Psychiatric/Behavioral: Negative.     Objective   Vitals:   09/30/18 1325  BP: (!) 195/75  Pulse: 75  Resp: 16  Temp: 98.4 F (36.9 C)  SpO2: 96%    Physical Exam Vitals signs reviewed.  Constitutional:      Appearance: Normal appearance. He is not ill-appearing.  HENT:     Head: Normocephalic and atraumatic.  Cardiovascular:     Rate and Rhythm: Normal rate and regular rhythm.     Heart sounds: Normal heart sounds. No murmur. No gallop.   Pulmonary:     Effort: Pulmonary effort is normal. No respiratory distress.     Breath sounds: Normal breath sounds. No stridor. No wheezing, rhonchi or rales.  Skin:    General: Skin is warm and dry.     Comments: Small opening residual sebaceous cyst with some of the cystic wall still present deep at the base.  The opening is less than 5 mm.  No drainage has been noted.  Hydrogen peroxide and Q-tip applied.  This wound was cleaned.  Neurological:     Mental Status: He is alert and oriented to person, place, and time.     Assessment  Sebaceous cyst of chest wall, resolving Plan   No need for acute surgical invention at this time.  Patient was told to keep the wound clean and dry and use a Q-tip and peroxide daily to clean the  wound.  Should it not fully resolved, the patient is instructed to return to my office for evaluation.  He understands and agrees.

## 2018-09-30 NOTE — Patient Instructions (Signed)
Epidermal Cyst  An epidermal cyst is a small, painless lump under your skin. The cyst contains a grayish-white, bad-smelling substance (keratin). Do not try to pop or open an epidermal cyst yourself. What are the causes?  A blocked hair follicle.  A hair that curls and re-enters the skin instead of growing straight out of the skin.  A blocked pore.  Irritated skin.  An injury to the skin.  Certain conditions that are passed along from parent to child (inherited).  Human papillomavirus (HPV).  Long-term sun damage to the skin. What increases the risk?  Having acne.  Being overweight.  Being 27-52 years old. What are the signs or symptoms? These cysts are usually harmless, but they can get infected. Symptoms of infection may include:  Redness.  Inflammation.  Tenderness.  Warmth.  Fever.  A grayish-white, bad-smelling substance drains from the cyst.  Pus drains from the cyst. How is this treated? In many cases, epidermal cysts go away on their own without treatment. If a cyst becomes infected, treatment may include:  Opening and draining the cyst, done by a doctor. After draining, you may need minor surgery to remove the rest of the cyst.  Antibiotic medicine.  Shots of medicines (steroids) that help to reduce inflammation.  Surgery to remove the cyst. Surgery may be done if the cyst: ? Becomes large. ? Bothers you. ? Has a chance of turning into cancer.  Do not try to open a cyst yourself. Follow these instructions at home:  Take over-the-counter and prescription medicines only as told by your doctor.  If you were prescribed an antibiotic medicine, take it it as told by your doctor. Do not stop using the antibiotic even if you start to feel better.  Keep the area around your cyst clean and dry.  Wear loose, dry clothing.  Avoid touching your cyst.  Check your cyst every day for signs of infection. Check for: ? Redness, swelling, or pain. ? Fluid  or blood. ? Warmth. ? Pus or a bad smell.  Keep all follow-up visits as told by your doctor. This is important. How is this prevented?  Wear clean, dry, clothing.  Avoid wearing tight clothing.  Keep your skin clean and dry. Take showers or baths every day. Contact a doctor if:  Your cyst has symptoms of infection.  Your condition does not improve or gets worse.  You have a cyst that looks different from other cysts you have had.  You have a fever. Get help right away if:  Redness spreads from the cyst into the area close by. Summary  An epidermal cyst is a sac made of skin tissue.  If a cyst becomes infected, treatment may include surgery to open and drain the cyst, or to remove it.  Take over-the-counter and prescription medicines only as told by your doctor.  Contact a doctor if your condition is not improving or is getting worse.  Keep all follow-up visits as told by your doctor. This is important. This information is not intended to replace advice given to you by your health care provider. Make sure you discuss any questions you have with your health care provider. Document Released: 02/16/2004 Document Revised: 05/01/2018 Document Reviewed: 10/17/2017 Elsevier Patient Education  2020 [employer].

## 2018-10-24 ENCOUNTER — Other Ambulatory Visit: Payer: Self-pay

## 2018-10-24 ENCOUNTER — Other Ambulatory Visit (INDEPENDENT_AMBULATORY_CARE_PROVIDER_SITE_OTHER): Payer: Medicare Other | Admitting: *Deleted

## 2018-10-24 DIAGNOSIS — Z23 Encounter for immunization: Secondary | ICD-10-CM | POA: Diagnosis not present

## 2018-12-05 ENCOUNTER — Other Ambulatory Visit: Payer: Self-pay | Admitting: Family Medicine

## 2018-12-05 NOTE — Telephone Encounter (Signed)
Six mo 

## 2018-12-08 ENCOUNTER — Ambulatory Visit: Payer: Medicare Other | Admitting: Family Medicine

## 2018-12-08 ENCOUNTER — Other Ambulatory Visit: Payer: Self-pay | Admitting: Family Medicine

## 2018-12-08 ENCOUNTER — Encounter: Payer: Self-pay | Admitting: Family Medicine

## 2018-12-08 ENCOUNTER — Other Ambulatory Visit: Payer: Self-pay

## 2018-12-08 VITALS — BP 150/72 | Temp 97.1°F | Wt 222.6 lb

## 2018-12-08 DIAGNOSIS — I10 Essential (primary) hypertension: Secondary | ICD-10-CM | POA: Diagnosis not present

## 2018-12-08 DIAGNOSIS — E119 Type 2 diabetes mellitus without complications: Secondary | ICD-10-CM

## 2018-12-08 DIAGNOSIS — M7701 Medial epicondylitis, right elbow: Secondary | ICD-10-CM | POA: Diagnosis not present

## 2018-12-08 DIAGNOSIS — E785 Hyperlipidemia, unspecified: Secondary | ICD-10-CM | POA: Diagnosis not present

## 2018-12-08 LAB — POCT GLYCOSYLATED HEMOGLOBIN (HGB A1C): Hemoglobin A1C: 6.6 % — AB (ref 4.0–5.6)

## 2018-12-08 MED ORDER — PANTOPRAZOLE SODIUM 40 MG PO TBEC: 40.0000 mg | DELAYED_RELEASE_TABLET | Freq: Every day | ORAL | 1 refills | Status: DC

## 2018-12-08 MED ORDER — SITAGLIPTIN PHOSPHATE 50 MG PO TABS: 50.0000 mg | ORAL_TABLET | Freq: Every day | ORAL | 5 refills | Status: DC

## 2018-12-08 MED ORDER — GLIPIZIDE 5 MG PO TABS: ORAL_TABLET | ORAL | 1 refills | Status: DC

## 2018-12-08 MED ORDER — DILTIAZEM HCL ER BEADS 360 MG PO CP24: 360.0000 mg | ORAL_CAPSULE | Freq: Every day | ORAL | 1 refills | Status: DC

## 2018-12-08 MED ORDER — TRIAMTERENE-HCTZ 37.5-25 MG PO CAPS: ORAL_CAPSULE | ORAL | 1 refills | Status: DC

## 2018-12-08 MED ORDER — ETODOLAC 400 MG PO TABS: ORAL_TABLET | ORAL | 0 refills | Status: DC

## 2018-12-08 MED ORDER — ENALAPRIL MALEATE 10 MG PO TABS: 10.0000 mg | ORAL_TABLET | Freq: Every day | ORAL | 1 refills | Status: DC

## 2018-12-08 NOTE — Progress Notes (Signed)
   Subjective:  Patient arrives with multiple concerns  Patient ID: Kyle Dunlap, male    DOB: August 10, 1945, 73 y.o.   MRN: [id number]  Shoulder Pain  The pain is present in the right shoulder and right arm. This is a new problem. The current episode started 1 to 4 weeks ago. Associated symptoms comments: Pain going down right arm last week . Treatments tried: tylenol, advil, lidocaine. The treatment provided mild relief.   Patient claims compliance with diabetes medication. No obvious side effects. Reports no substantial low sugar spells. Most numbers are generally in good range when checked fasting. Generally does not miss a dose of medication. Watching diabetic diet closely  Blood pressure medicine and blood pressure levels reviewed today with patient. Compliant with blood pressure medicine. States does not miss a dose. No obvious side effects. Blood pressure generally good when checked elsewhere. Watching salt intake.   Has had shoulder pain for soe tie a year   Moved into the upper and forearm the last wk   Felt terrible  Worse with motion   Patient claims compliance with diabetes medication. No obvious side effects. Reports no substantial low sugar spells. Most numbers are generally in good range when checked fasting. Generally does not miss a dose of medication. Watching diabetic diet closely  Results for orders placed or performed in visit on 12/08/18  POCT HgB A1C  Result Value Ref Range   Hemoglobin A1C 6.6 (A) 4.0 - 5.6 %   HbA1c POC (<> result, manual entry)     HbA1c, POC (prediabetic range)     HbA1c, POC (controlled diabetic range)      Review of Systems No headache, no major weight loss or weight gain, no chest pain no back pain abdominal pain no change in bowel habits complete ROS otherwise negative     Objective:   Physical Exam  Alert and oriented, vitals reviewed and stable, NAD ENT-TM's and ext canals WNL bilat via otoscopic exam Soft palate, tonsils  and post pharynx WNL via oropharyngeal exam Neck-symmetric, no masses; thyroid nonpalpable and nontender Pulmonary-no tachypnea or accessory muscle use; Clear without wheezes via auscultation Card--no abnrml murmurs, rhythm reg and rate WNL Carotid pulses symmetric, without bruits Mild impingement sign right shoulder  Right elbow distinct epicondyle tenderness both medial and      Assessment & Plan:  Impression 1 type 2 diabetes excellent control A1c good discussed diet discussed medications refilled  2.  Hypertension.  Blood pressure fair on repeat patient maintain.  Compliance discussed  3.  Medial and lateral epicondylitis.  Recommend forearm strap.  Lodine to use very sparingly with history of renal compromise  Appropriate medication refill.  Appropriate blood work.  Flu shot already given.  Follow-up in 6 months [facility]

## 2018-12-23 ENCOUNTER — Ambulatory Visit (INDEPENDENT_AMBULATORY_CARE_PROVIDER_SITE_OTHER): Payer: Medicare Other | Admitting: Family Medicine

## 2018-12-23 ENCOUNTER — Encounter: Payer: Self-pay | Admitting: Family Medicine

## 2018-12-23 ENCOUNTER — Other Ambulatory Visit: Payer: Self-pay

## 2018-12-23 VITALS — BP 136/82 | Temp 98.1°F | Ht 69.5 in | Wt 222.6 lb

## 2018-12-23 DIAGNOSIS — I10 Essential (primary) hypertension: Secondary | ICD-10-CM

## 2018-12-23 DIAGNOSIS — Z125 Encounter for screening for malignant neoplasm of prostate: Secondary | ICD-10-CM

## 2018-12-23 DIAGNOSIS — Z0000 Encounter for general adult medical examination without abnormal findings: Secondary | ICD-10-CM | POA: Diagnosis not present

## 2018-12-23 DIAGNOSIS — E785 Hyperlipidemia, unspecified: Secondary | ICD-10-CM

## 2018-12-23 DIAGNOSIS — E119 Type 2 diabetes mellitus without complications: Secondary | ICD-10-CM | POA: Diagnosis not present

## 2018-12-23 DIAGNOSIS — Z79899 Other long term (current) drug therapy: Secondary | ICD-10-CM

## 2018-12-23 NOTE — Progress Notes (Signed)
   Subjective:    Patient ID: Kyle Dunlap, male    DOB: 04/04/1945, 73 y.o.   MRN: [id number]  HPI  The patient comes in today for a wellness visit.    A review of their health history was completed.  A review of medications was also completed.  Any needed refills; none  Eating habits: pretty good  Falls/  MVA accidents in past few months: none  Regular exercise: not so good  Specialist pt sees on regular basis: none  Preventative health issues were discussed.   Additional concerns: none   Review of Systems  Constitutional: Negative for activity change, appetite change and fever.  HENT: Negative for congestion and rhinorrhea.   Eyes: Negative for discharge.  Respiratory: Negative for cough and wheezing.   Cardiovascular: Negative for chest pain.  Gastrointestinal: Negative for abdominal pain, blood in stool and vomiting.  Genitourinary: Negative for difficulty urinating and frequency.  Musculoskeletal: Negative for neck pain.  Skin: Negative for rash.  Allergic/Immunologic: Negative for environmental allergies and food allergies.  Neurological: Negative for weakness and headaches.  Psychiatric/Behavioral: Negative for agitation.  All other systems reviewed and are negative.      Objective:   Physical Exam Vitals signs reviewed.  Constitutional:      Appearance: He is well-developed.  HENT:     Head: Normocephalic and atraumatic.     Right Ear: External ear normal.     Left Ear: External ear normal.     Nose: Nose normal.  Eyes:     Pupils: Pupils are equal, round, and reactive to light.  Neck:     Musculoskeletal: Normal range of motion and neck supple.     Thyroid: No thyromegaly.  Cardiovascular:     Rate and Rhythm: Normal rate and regular rhythm.     Heart sounds: Normal heart sounds. No murmur.  Pulmonary:     Effort: Pulmonary effort is normal. No respiratory distress.     Breath sounds: Normal breath sounds. No wheezing.  Abdominal:   General: Bowel sounds are normal. There is no distension.     Palpations: Abdomen is soft. There is no mass.     Tenderness: There is no abdominal tenderness.  Genitourinary:    Penis: Normal.   Musculoskeletal: Normal range of motion.  Lymphadenopathy:     Cervical: No cervical adenopathy.  Skin:    General: Skin is warm and dry.     Findings: No erythema.  Neurological:     Mental Status: He is alert.     Motor: No abnormal muscle tone.  Psychiatric:        Behavior: Behavior normal.        Judgment: Judgment normal.           Assessment & Plan:  Impression well adult exam.  Up-to-date on colonoscopy.  Next 1 due in 1 year.  Up-to-date on vaccinations.  Diet discussed.  Exercise discussed.  Up-to-date on eye doctor visits.  Follow-up in 6 months

## 2018-12-24 LAB — HEPATIC FUNCTION PANEL
ALT: 27 IU/L (ref 0–44)
AST: 18 IU/L (ref 0–40)
Albumin: 4.5 g/dL (ref 3.7–4.7)
Alkaline Phosphatase: 107 IU/L (ref 39–117)
Bilirubin Total: 0.4 mg/dL (ref 0.0–1.2)
Bilirubin, Direct: 0.1 mg/dL (ref 0.00–0.40)
Total Protein: 7.2 g/dL (ref 6.0–8.5)

## 2018-12-24 LAB — PSA: Prostate Specific Ag, Serum: 2 ng/mL (ref 0.0–4.0)

## 2018-12-24 LAB — LIPID PANEL
Chol/HDL Ratio: 8.6 ratio — ABNORMAL HIGH (ref 0.0–5.0)
Cholesterol, Total: 291 mg/dL — ABNORMAL HIGH (ref 100–199)
HDL: 34 mg/dL — ABNORMAL LOW (ref >39)
LDL Chol Calc (NIH): 201 mg/dL — ABNORMAL HIGH (ref 0–99)
Triglycerides: 279 mg/dL — ABNORMAL HIGH (ref 0–149)
VLDL Cholesterol Cal: 56 mg/dL — ABNORMAL HIGH (ref 5–40)

## 2018-12-24 LAB — MICROALBUMIN / CREATININE URINE RATIO
Creatinine, Urine: 201.9 mg/dL
Microalb/Creat Ratio: 22 mg/g creat (ref 0–29)
Microalbumin, Urine: 45.3 ug/mL

## 2018-12-24 LAB — BASIC METABOLIC PANEL
BUN/Creatinine Ratio: 15 (ref 10–24)
BUN: 28 mg/dL — ABNORMAL HIGH (ref 8–27)
CO2: 23 mmol/L (ref 20–29)
Calcium: 9.6 mg/dL (ref 8.6–10.2)
Chloride: 100 mmol/L (ref 96–106)
Creatinine, Ser: 1.87 mg/dL — ABNORMAL HIGH (ref 0.76–1.27)
GFR calc Af Amer: 40 mL/min/{1.73_m2} — ABNORMAL LOW (ref >59)
GFR calc non Af Amer: 34 mL/min/{1.73_m2} — ABNORMAL LOW (ref >59)
Glucose: 178 mg/dL — ABNORMAL HIGH (ref 65–99)
Potassium: 5 mmol/L (ref 3.5–5.2)
Sodium: 136 mmol/L (ref 134–144)

## 2018-12-28 ENCOUNTER — Encounter: Payer: Self-pay | Admitting: Family Medicine

## 2019-01-06 ENCOUNTER — Other Ambulatory Visit: Payer: Self-pay

## 2019-01-06 ENCOUNTER — Encounter: Payer: Self-pay | Admitting: Family Medicine

## 2019-01-06 ENCOUNTER — Ambulatory Visit (INDEPENDENT_AMBULATORY_CARE_PROVIDER_SITE_OTHER): Payer: Medicare Other | Admitting: Family Medicine

## 2019-01-06 DIAGNOSIS — Z20828 Contact with and (suspected) exposure to other viral communicable diseases: Secondary | ICD-10-CM | POA: Diagnosis not present

## 2019-01-06 DIAGNOSIS — Z20822 Contact with and (suspected) exposure to covid-19: Secondary | ICD-10-CM

## 2019-01-06 MED ORDER — ALBUTEROL SULFATE HFA 108 (90 BASE) MCG/ACT IN AERS: 2.0000 | INHALATION_SPRAY | Freq: Four times a day (QID) | RESPIRATORY_TRACT | 0 refills | Status: DC | PRN

## 2019-01-06 MED ORDER — CEFPROZIL 500 MG PO TABS: 500.0000 mg | ORAL_TABLET | Freq: Two times a day (BID) | ORAL | 0 refills | Status: DC

## 2019-01-06 NOTE — Progress Notes (Signed)
   Subjective:    Patient ID: Kyle Dunlap, male    DOB: July 22, 1945, 73 y.o.   MRN: [id number]  Sinusitis This is a new problem. The current episode started yesterday. Maximum temperature: 103.8 yesterday. Associated symptoms include congestion and headaches. Past treatments include acetaminophen (advil).   Pt has had to use his wife's inhaler.  Virtual Visit via Telephone Note  I connected with Ruben Reason on 01/06/19 at  9:30 AM EST by telephone and verified that I am speaking with the correct person using two identifiers.  Location: Patient: home Provider: office   I discussed the limitations, risks, security and privacy concerns of performing an evaluation and management service by telephone and the availability of in person appointments. I also discussed with the patient that there may be a patient responsible charge related to this service. The patient expressed understanding and agreed to proceed.   History of Present Illness:    Observations/Objective:   Assessment and Plan:   Follow Up Instructions:    I discussed the assessment and treatment plan with the patient. The patient was provided an opportunity to ask questions and all were answered. The patient agreed with the plan and demonstrated an understanding of the instructions.   The patient was advised to call back or seek an in-person evaluation if the symptoms worsen or if the condition fails to improve as anticipated.  I provided 44minutes of non-face-to-face time during this encounter.   Felt bad  Sudden onset   Head aching also ear   A little congestion   Felt tught    Review of Systems  HENT: Positive for congestion.   Neurological: Positive for headaches.       Objective:   Physical Exam   Virtual     Assessment & Plan:  Impression suspected COVID-19 infection.  Potential element of reactive airways.  Noticed improvement with wife's inhaler.  COVID-19 testing encouraged.   Quarantine measures discussed.  Warning signs discussed.  Will cover with course of antibiotics with sinus element

## 2019-01-07 ENCOUNTER — Other Ambulatory Visit: Payer: Self-pay

## 2019-01-07 ENCOUNTER — Ambulatory Visit: Payer: Medicare Other | Attending: Internal Medicine

## 2019-01-07 DIAGNOSIS — Z20822 Contact with and (suspected) exposure to covid-19: Secondary | ICD-10-CM

## 2019-01-08 ENCOUNTER — Encounter: Payer: Self-pay | Admitting: Family Medicine

## 2019-01-08 ENCOUNTER — Telehealth: Payer: Self-pay | Admitting: Family Medicine

## 2019-01-08 NOTE — Telephone Encounter (Signed)
Patient having diarrhea also having ongoing body aches fever chills cough denies being short of breath patient doing the best he can at eating and drinking and using Imodium  I did educate the patient what warning signs to watch for and if they should occur to go to the ER  Nurses-please call patient on Friday check in with him see how he is doing in regards to fever, breathing, body aches, diarrhea  Please then forward to Dr. Richardson Landry at that time for his review

## 2019-01-08 NOTE — Telephone Encounter (Signed)
Nurses Call the patient please Please see how he is doing in regards to breathing in regards to keeping things down vomiting diarrhea.  It is possible that the underlying illness is causing the diarrhea but it is also possible medication could be

## 2019-01-09 LAB — NOVEL CORONAVIRUS, NAA: SARS-CoV-2, NAA: NOT DETECTED

## 2019-01-09 NOTE — Telephone Encounter (Signed)
Called pt and she states he is doing better than yesterday. Not trouble with breathing, no fever, no vomiting, he is eating and drinking today. Has had a little bit of diarrhea but a lot better than yesterday

## 2019-01-09 NOTE — Telephone Encounter (Signed)
Pt wife contacted and verbalized understanding.  

## 2019-01-09 NOTE — Telephone Encounter (Signed)
Temp is normal this morning. HR 72. Oxygen 96. Pt is having some stomach pain. Pt denies any other symptoms. Pt states no diarrhea and is able to eat/drink. Please advise. Thank you

## 2019-01-09 NOTE — Telephone Encounter (Signed)
COVID results in [place]. Please advise. Thank you

## 2019-01-09 NOTE — Telephone Encounter (Signed)
Ok covid still pending ck around 3 or 4 to see if result in yet

## 2019-01-09 NOTE — Telephone Encounter (Signed)
Ok test neg, cst, pt does not qualify for covid clinic because result neg, tell pt he could still truly have it so would rec avoiding others where possible and stay on abx, tyl prn for abd pain

## 2019-01-12 ENCOUNTER — Other Ambulatory Visit: Payer: Medicare Other

## 2019-01-13 ENCOUNTER — Other Ambulatory Visit: Payer: Self-pay

## 2019-01-13 ENCOUNTER — Ambulatory Visit: Payer: Medicare Other | Attending: Internal Medicine

## 2019-01-13 DIAGNOSIS — Z20822 Contact with and (suspected) exposure to covid-19: Secondary | ICD-10-CM

## 2019-01-14 LAB — NOVEL CORONAVIRUS, NAA: SARS-CoV-2, NAA: NOT DETECTED

## 2019-01-29 ENCOUNTER — Other Ambulatory Visit: Payer: Self-pay | Admitting: Family Medicine

## 2019-01-29 NOTE — Telephone Encounter (Signed)
Wellness 12/23/18

## 2019-02-26 ENCOUNTER — Encounter: Payer: Self-pay | Admitting: Family Medicine

## 2019-05-28 ENCOUNTER — Other Ambulatory Visit: Payer: Self-pay | Admitting: Family Medicine

## 2019-06-05 ENCOUNTER — Other Ambulatory Visit: Payer: Self-pay

## 2019-06-05 ENCOUNTER — Ambulatory Visit: Payer: Medicare Other | Admitting: Family Medicine

## 2019-06-05 ENCOUNTER — Encounter: Payer: Self-pay | Admitting: Family Medicine

## 2019-06-05 VITALS — BP 168/90 | Temp 97.1°F | Wt 218.6 lb

## 2019-06-05 DIAGNOSIS — H8103 Meniere's disease, bilateral: Secondary | ICD-10-CM | POA: Diagnosis not present

## 2019-06-05 DIAGNOSIS — E785 Hyperlipidemia, unspecified: Secondary | ICD-10-CM | POA: Diagnosis not present

## 2019-06-05 DIAGNOSIS — I10 Essential (primary) hypertension: Secondary | ICD-10-CM | POA: Diagnosis not present

## 2019-06-05 DIAGNOSIS — E119 Type 2 diabetes mellitus without complications: Secondary | ICD-10-CM

## 2019-06-05 LAB — POCT GLYCOSYLATED HEMOGLOBIN (HGB A1C): Hemoglobin A1C: 7.6 % — AB (ref 4.0–5.6)

## 2019-06-05 MED ORDER — PANTOPRAZOLE SODIUM 40 MG PO TBEC: 40.0000 mg | DELAYED_RELEASE_TABLET | Freq: Every day | ORAL | 1 refills | Status: DC

## 2019-06-05 MED ORDER — SITAGLIPTIN PHOSPHATE 50 MG PO TABS: 50.0000 mg | ORAL_TABLET | Freq: Every day | ORAL | 1 refills | Status: DC

## 2019-06-05 MED ORDER — DIAZEPAM 2 MG PO TABS: ORAL_TABLET | ORAL | 5 refills | Status: DC

## 2019-06-05 MED ORDER — GLIPIZIDE 5 MG PO TABS: ORAL_TABLET | ORAL | 1 refills | Status: DC

## 2019-06-05 MED ORDER — ENALAPRIL MALEATE 20 MG PO TABS
ORAL_TABLET | ORAL | 1 refills | Status: DC
Start: 2019-06-05 — End: 2020-01-01

## 2019-06-05 MED ORDER — TRIAMTERENE-HCTZ 37.5-25 MG PO CAPS: ORAL_CAPSULE | ORAL | 1 refills | Status: DC

## 2019-06-05 MED ORDER — DILTIAZEM HCL ER BEADS 360 MG PO CP24: 360.0000 mg | ORAL_CAPSULE | Freq: Every day | ORAL | 1 refills | Status: DC

## 2019-06-05 NOTE — Progress Notes (Signed)
   Subjective:    Patient ID: Kyle Dunlap, male    DOB: 1945/10/20, 74 y.o.   MRN: [id number]  Diabetes He presents for his follow-up diabetic visit. He has type 2 diabetes mellitus. There are no hypoglycemic associated symptoms. There are no diabetic associated symptoms. There are no hypoglycemic complications. There are no diabetic complications. Risk factors for coronary artery disease include male sex and hypertension. He rarely participates in exercise. He sees a podiatrist.Eye exam is current.   Results for orders placed or performed in visit on 06/05/19  POCT HgB A1C  Result Value Ref Range   Hemoglobin A1C 7.6 (A) 4.0 - 5.6 %   HbA1c POC (<> result, manual entry)     HbA1c, POC (prediabetic range)     HbA1c, POC (controlled diabetic range)     Patient claims compliance with diabetes medication. No obvious side effects. Reports no substantial low sugar spells. Most numbers are generally in good range when checked fasting. Generally does not miss a dose of medication. Watching diabetic diet closely  Blood pressure medicine and blood pressure levels reviewed today with patient. Compliant with blood pressure medicine. States does not miss a dose. No obvious side effects. Blood pressure generally good when checked elsewhere. Watching salt intake.  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  Requires as needed Valium for this.    Review of Systems .rs No headache, no major weight loss or weight gain, no chest pain no back pain abdominal pain no change in bowel habits complete ROS otherwise negative     Objective:   Physical Exam Alert and oriented, vitals reviewed and stable, NAD ENT-TM's and ext canals WNL bilat via otoscopic exam Soft palate, tonsils and post pharynx WNL via oropharyngeal exam Neck-symmetric, no masses; thyroid nonpalpable and nontender Pulmonary-no tachypnea or accessory muscle use; Clear  without wheezes via auscultation Card--no abnrml murmurs, rhythm reg and rate WNL Carotid pulses symmetric, without bruits No focal neurological deficits       Assessment & Plan:  Impression 1 type 2 diabetes.  Control considered good 7.6. new parameter recommendations discussed.  Patient to work harder on diet and exercise  2.  Hypertension.  On repeat blood pressure not improved.  Patient to increase Vasotec to 20 mg,diet exercise discussed  3.  Mnire's disease with dizziness ongoing.  Medications refilled to maintain  4.  Reflux clinically stable patient maintain same meds

## 2019-12-09 ENCOUNTER — Other Ambulatory Visit: Payer: Self-pay | Admitting: Family Medicine

## 2019-12-24 ENCOUNTER — Ambulatory Visit: Payer: Medicare Other | Admitting: Family Medicine

## 2019-12-29 ENCOUNTER — Telehealth: Payer: Self-pay | Admitting: Family Medicine

## 2019-12-29 NOTE — Telephone Encounter (Signed)
[facility] requesting refill on Triamterene HCTZ 37.5-25 mg. Take one tablet po daily. Pt has appt 01/01/20. Please advise. Thank you

## 2019-12-30 MED ORDER — TRIAMTERENE-HCTZ 37.5-25 MG PO CAPS: ORAL_CAPSULE | ORAL | 0 refills | Status: DC

## 2019-12-30 NOTE — Telephone Encounter (Signed)
Refills sent to pharmacy. 

## 2019-12-30 NOTE — Addendum Note (Signed)
Addended by: Vicente Males on: 12/30/2019 05:13 PM   Modules accepted: Orders

## 2019-12-30 NOTE — Telephone Encounter (Signed)
Pls give 30 day supply thx. Dr. Lovena Le

## 2020-01-01 ENCOUNTER — Ambulatory Visit: Payer: Medicare Other | Admitting: Family Medicine

## 2020-01-01 ENCOUNTER — Encounter: Payer: Self-pay | Admitting: Family Medicine

## 2020-01-01 ENCOUNTER — Other Ambulatory Visit: Payer: Self-pay

## 2020-01-01 VITALS — BP 138/82 | HR 74 | Temp 98.0°F | Ht 69.0 in | Wt 217.8 lb

## 2020-01-01 DIAGNOSIS — E785 Hyperlipidemia, unspecified: Secondary | ICD-10-CM | POA: Diagnosis not present

## 2020-01-01 DIAGNOSIS — H8103 Meniere's disease, bilateral: Secondary | ICD-10-CM | POA: Diagnosis not present

## 2020-01-01 DIAGNOSIS — I10 Essential (primary) hypertension: Secondary | ICD-10-CM | POA: Diagnosis not present

## 2020-01-01 DIAGNOSIS — E119 Type 2 diabetes mellitus without complications: Secondary | ICD-10-CM | POA: Diagnosis not present

## 2020-01-01 LAB — POCT GLYCOSYLATED HEMOGLOBIN (HGB A1C): Hemoglobin A1C: 5.7 % — AB (ref 4.0–5.6)

## 2020-01-01 MED ORDER — ENALAPRIL MALEATE 20 MG PO TABS: ORAL_TABLET | ORAL | 1 refills | Status: DC

## 2020-01-01 MED ORDER — GLIPIZIDE 5 MG PO TABS: ORAL_TABLET | ORAL | 1 refills | Status: DC

## 2020-01-01 MED ORDER — PANTOPRAZOLE SODIUM 40 MG PO TBEC: 40.0000 mg | DELAYED_RELEASE_TABLET | Freq: Every day | ORAL | 1 refills | Status: DC

## 2020-01-01 MED ORDER — TRIAMTERENE-HCTZ 37.5-25 MG PO CAPS: ORAL_CAPSULE | ORAL | 1 refills | Status: DC

## 2020-01-01 MED ORDER — DIAZEPAM 2 MG PO TABS: ORAL_TABLET | ORAL | 2 refills | Status: DC

## 2020-01-01 MED ORDER — SITAGLIPTIN PHOSPHATE 50 MG PO TABS: 50.0000 mg | ORAL_TABLET | Freq: Every day | ORAL | 1 refills | Status: DC

## 2020-01-01 NOTE — Progress Notes (Signed)
Patient ID: TAMIM SKOG, male    DOB: 1945-08-31, 74 y.o.   MRN: [id number]   Chief Complaint  Patient presents with  . Diabetes    Follow up   Subjective:    HPI   F/u dm- Dm2- Compliant with medications. Checking blood glucose.   Not seeing any high or low numbers.  Denies polyuria or polydipsia.  Eye exam: overdue. Foot exam: no foot concerns.  Pt feeling well and not ill with covid illness.  118 fasting bg.  meniere's disease and using valium at night 83m.  Taking it almost nightly and helping with sleep.   No issues with falls or memory.  Not neeidng inhaler,  Has changed diet - decreasing carbs in past year.  H/o hld- has statin intolerance, tried many.  Not on any medications for this.    Results for orders placed or performed in visit on 01/01/20  CBC  Result Value Ref Range   WBC 8.1 3.4 - 10.8 x10E3/uL   RBC 4.30 4.14 - 5.80 x10E6/uL   Hemoglobin 13.3 13.0 - 17.7 g/dL   Hematocrit 37.6 37.5 - 51.0 %   MCV 87 79 - 97 fL   MCH 30.9 26.6 - 33.0 pg   MCHC 35.4 31.5 - 35.7 g/dL   RDW 13.1 11.6 - 15.4 %   Platelets 293 150 - 450 x10E3/uL  CMP14+EGFR  Result Value Ref Range   Glucose 124 (H) 65 - 99 mg/dL   BUN 26 8 - 27 mg/dL   Creatinine, Ser 1.60 (H) 0.76 - 1.27 mg/dL   GFR calc non Af Amer 41 (L) >59 mL/min/1.73   GFR calc Af Amer 48 (L) >59 mL/min/1.73   BUN/Creatinine Ratio 16 10 - 24   Sodium 131 (L) 134 - 144 mmol/L   Potassium 5.1 3.5 - 5.2 mmol/L   Chloride 97 96 - 106 mmol/L   CO2 19 (L) 20 - 29 mmol/L   Calcium 9.6 8.6 - 10.2 mg/dL   Total Protein 7.6 6.0 - 8.5 g/dL   Albumin 4.6 3.7 - 4.7 g/dL   Globulin, Total 3.0 1.5 - 4.5 g/dL   Albumin/Globulin Ratio 1.5 1.2 - 2.2   Bilirubin Total 0.4 0.0 - 1.2 mg/dL   Alkaline Phosphatase 104 44 - 121 IU/L   AST 18 0 - 40 IU/L   ALT 28 0 - 44 IU/L  Lipid panel  Result Value Ref Range   Cholesterol, Total 312 (H) 100 - 199 mg/dL   Triglycerides 244 (H) 0 - 149 mg/dL   HDL 39 (L) >39  mg/dL   VLDL Cholesterol Cal 49 (H) 5 - 40 mg/dL   LDL Chol Calc (NIH) 224 (H) 0 - 99 mg/dL   Chol/HDL Ratio 8.0 (H) 0.0 - 5.0 ratio  Hemoglobin A1c  Result Value Ref Range   Hgb A1c MFr Bld 7.1 (H) 4.8 - 5.6 %   Est. average glucose Bld gHb Est-mCnc 157 mg/dL  Microalbumin, urine  Result Value Ref Range   Microalbumin, Urine 80.7 Not Estab. ug/mL  POCT glycosylated hemoglobin (Hb A1C)  Result Value Ref Range   Hemoglobin A1C 5.7 (A) 4.0 - 5.6 %   HbA1c POC (<> result, manual entry)     HbA1c, POC (prediabetic range)     HbA1c, POC (controlled diabetic range)      Medical History PAmmanhas a past medical history of Adenomatous colon polyp (11/2008), Allergy, Chronic back pain, DDD (degenerative disc disease), lumbar, Hemorrhoids, external, High cholesterol,  HTN (hypertension), Hyperplastic colon polyp (11/2008), Kidney stone, Meniere's disease, Tachyarrhythmia, and Type 2 diabetes mellitus ([place]).   Outpatient Encounter Medications as of 01/01/2020  Medication Sig  . acetaminophen (TYLENOL) 325 MG tablet Take 325 mg by mouth 2 (two) times daily as needed for mild pain or headache.   . albuterol (VENTOLIN HFA) 108 (90 Base) MCG/ACT inhaler TAKE 2 PUFFS BY MOUTH EVERY 6 HOURS AS NEEDED FOR WHEEZE OR SHORTNESS OF BREATH  . Ascorbic Acid (VITAMIN C) 1000 MG tablet Take 1,000 mg by mouth 2 (two) times daily.   Marland Kitchen aspirin 81 MG tablet Take 81 mg by mouth daily.  Marland Kitchen ibuprofen (ADVIL,MOTRIN) 200 MG tablet Take 200 mg by mouth every 6 (six) hours as needed for headache or mild pain.   . Multiple Vitamin (MULTIVITAMIN WITH MINERALS) TABS Take 1 tablet by mouth daily.  . [DISCONTINUED] diazepam (VALIUM) 2 MG tablet TAKE ONE TABLET BY MOUTH AT BEDTIME FOR DIZZINESS.  . [DISCONTINUED] diltiazem (TIAZAC) 360 MG 24 hr capsule Take 1 capsule (360 mg total) by mouth daily.  . [DISCONTINUED] enalapril (VASOTEC) 20 MG tablet Take one tablet po daily  . [DISCONTINUED] glipiZIDE (GLUCOTROL) 5 MG tablet  TAKE 2 TABLETS BY MOUTH TWICE DAILY  . [DISCONTINUED] pantoprazole (PROTONIX) 40 MG tablet Take 1 tablet (40 mg total) by mouth daily.  . [DISCONTINUED] sitaGLIPtin (JANUVIA) 50 MG tablet Take 1 tablet (50 mg total) by mouth daily.  . [DISCONTINUED] triamterene-hydrochlorothiazide (DYAZIDE) 37.5-25 MG capsule TAKE 1 EACH (1 CAPSULE TOTAL) BY MOUTH DAILY.  . diazepam (VALIUM) 2 MG tablet TAKE ONE TABLET BY MOUTH AT BEDTIME FOR DIZZINESS.  Marland Kitchen enalapril (VASOTEC) 20 MG tablet Take one tablet po daily  . glipiZIDE (GLUCOTROL) 5 MG tablet TAKE 2 TABLETS BY MOUTH TWICE DAILY  . pantoprazole (PROTONIX) 40 MG tablet Take 1 tablet (40 mg total) by mouth daily.  . sitaGLIPtin (JANUVIA) 50 MG tablet Take 1 tablet (50 mg total) by mouth daily.  Marland Kitchen triamterene-hydrochlorothiazide (DYAZIDE) 37.5-25 MG capsule TAKE 1 EACH (1 CAPSULE TOTAL) BY MOUTH DAILY.   No facility-administered encounter medications on file as of 01/01/2020.     Review of Systems  Constitutional: Negative for chills and fever.  HENT: Negative for congestion, rhinorrhea and sore throat.   Respiratory: Negative for cough, shortness of breath and wheezing.   Cardiovascular: Negative for chest pain and leg swelling.  Gastrointestinal: Negative for abdominal pain, diarrhea, nausea and vomiting.  Genitourinary: Negative for dysuria and frequency.  Skin: Negative for rash.  Neurological: Negative for dizziness, weakness and headaches.     Vitals BP 138/82   Pulse 74   Temp 98 F (36.7 C) (Oral)   Ht _0  (1.753 m)   Wt 217 lb 12.8 oz (98.8 kg)   SpO2 99%   BMI 32.16 kg/m   Objective:   Physical Exam Vitals and nursing note reviewed.  Constitutional:      General: He is not in acute distress.    Appearance: Normal appearance. He is not ill-appearing.  HENT:     Head: Normocephalic.     Nose: Nose normal. No congestion.     Mouth/Throat:     Mouth: Mucous membranes are moist.     Pharynx: No oropharyngeal exudate.   Eyes:     Extraocular Movements: Extraocular movements intact.     Conjunctiva/sclera: Conjunctivae normal.     Pupils: Pupils are equal, round, and reactive to light.  Cardiovascular:     Rate and Rhythm: Normal rate  and regular rhythm.     Pulses: Normal pulses.     Heart sounds: Normal heart sounds. No murmur heard.   Pulmonary:     Effort: Pulmonary effort is normal.     Breath sounds: Normal breath sounds. No wheezing, rhonchi or rales.  Musculoskeletal:        General: Normal range of motion.     Right lower leg: No edema.     Left lower leg: No edema.  Skin:    General: Skin is warm and dry.     Findings: No rash.  Neurological:     General: No focal deficit present.     Mental Status: He is alert and oriented to person, place, and time.     Cranial Nerves: No cranial nerve deficit.  Psychiatric:        Mood and Affect: Mood normal.        Behavior: Behavior normal.        Thought Content: Thought content normal.        Judgment: Judgment normal.      Assessment and Plan   1. Diabetes mellitus without complication (HCC) - POCT glycosylated hemoglobin (Hb A1C) - CBC - CMP14+EGFR - Lipid panel - Hemoglobin A1c - Microalbumin, urine - glipiZIDE (GLUCOTROL) 5 MG tablet; TAKE 2 TABLETS BY MOUTH TWICE DAILY  Dispense: 360 tablet; Refill: 1 - sitaGLIPtin (JANUVIA) 50 MG tablet; Take 1 tablet (50 mg total) by mouth daily.  Dispense: 90 tablet; Refill: 1  2. Essential hypertension, benign - CMP14+EGFR - enalapril (VASOTEC) 20 MG tablet; Take one tablet po daily  Dispense: 90 tablet; Refill: 1 - triamterene-hydrochlorothiazide (DYAZIDE) 37.5-25 MG capsule; TAKE 1 EACH (1 CAPSULE TOTAL) BY MOUTH DAILY.  Dispense: 90 capsule; Refill: 1  3. Meniere's disease of both ears - diazepam (VALIUM) 2 MG tablet; TAKE ONE TABLET BY MOUTH AT BEDTIME FOR DIZZINESS.  Dispense: 30 tablet; Refill: 2  4. Hyperlipidemia LDL goal <100   Eye doctor appt yesterday- has pressure in eye  both, having laser procedure in January. No diabetic retinopathy. Seeing [facility], [address]    Meniere's disease- valium 59m prn.  htn- stable. Suboptimal.  Dec salt intake and Cont meds.  Dm2- cont meds. Stable.  gerd- controlled, refilled protonix.  hld- uncontrolled. Has statin intolerance.  Pt to do diet modifications.  Pt may need referral to cards for repatha.  F/u [id number]

## 2020-01-02 LAB — CBC
Hematocrit: 37.6 % (ref 37.5–51.0)
Hemoglobin: 13.3 g/dL (ref 13.0–17.7)
MCH: 30.9 pg (ref 26.6–33.0)
MCHC: 35.4 g/dL (ref 31.5–35.7)
MCV: 87 fL (ref 79–97)
Platelets: 293 10*3/uL (ref 150–450)
RBC: 4.3 x10E6/uL (ref 4.14–5.80)
RDW: 13.1 % (ref 11.6–15.4)
WBC: 8.1 10*3/uL (ref 3.4–10.8)

## 2020-01-02 LAB — CMP14+EGFR
ALT: 28 IU/L (ref 0–44)
AST: 18 IU/L (ref 0–40)
Albumin/Globulin Ratio: 1.5 (ref 1.2–2.2)
Albumin: 4.6 g/dL (ref 3.7–4.7)
Alkaline Phosphatase: 104 IU/L (ref 44–121)
BUN/Creatinine Ratio: 16 (ref 10–24)
BUN: 26 mg/dL (ref 8–27)
Bilirubin Total: 0.4 mg/dL (ref 0.0–1.2)
CO2: 19 mmol/L — ABNORMAL LOW (ref 20–29)
Calcium: 9.6 mg/dL (ref 8.6–10.2)
Chloride: 97 mmol/L (ref 96–106)
Creatinine, Ser: 1.6 mg/dL — ABNORMAL HIGH (ref 0.76–1.27)
GFR calc Af Amer: 48 mL/min/{1.73_m2} — ABNORMAL LOW (ref >59)
GFR calc non Af Amer: 41 mL/min/{1.73_m2} — ABNORMAL LOW (ref >59)
Globulin, Total: 3 g/dL (ref 1.5–4.5)
Glucose: 124 mg/dL — ABNORMAL HIGH (ref 65–99)
Potassium: 5.1 mmol/L (ref 3.5–5.2)
Sodium: 131 mmol/L — ABNORMAL LOW (ref 134–144)
Total Protein: 7.6 g/dL (ref 6.0–8.5)

## 2020-01-02 LAB — LIPID PANEL
Chol/HDL Ratio: 8 ratio — ABNORMAL HIGH (ref 0.0–5.0)
Cholesterol, Total: 312 mg/dL — ABNORMAL HIGH (ref 100–199)
HDL: 39 mg/dL — ABNORMAL LOW (ref >39)
LDL Chol Calc (NIH): 224 mg/dL — ABNORMAL HIGH (ref 0–99)
Triglycerides: 244 mg/dL — ABNORMAL HIGH (ref 0–149)
VLDL Cholesterol Cal: 49 mg/dL — ABNORMAL HIGH (ref 5–40)

## 2020-01-02 LAB — HEMOGLOBIN A1C
Est. average glucose Bld gHb Est-mCnc: 157 mg/dL
Hgb A1c MFr Bld: 7.1 % — ABNORMAL HIGH (ref 4.8–5.6)

## 2020-01-02 LAB — MICROALBUMIN, URINE: Microalbumin, Urine: 80.7 ug/mL

## 2020-01-07 ENCOUNTER — Other Ambulatory Visit: Payer: Self-pay | Admitting: *Deleted

## 2020-01-07 DIAGNOSIS — E785 Hyperlipidemia, unspecified: Secondary | ICD-10-CM

## 2020-01-07 DIAGNOSIS — Z789 Other specified health status: Secondary | ICD-10-CM

## 2020-01-14 ENCOUNTER — Other Ambulatory Visit: Payer: Self-pay

## 2020-01-14 MED ORDER — DILTIAZEM HCL ER BEADS 360 MG PO CP24: 360.0000 mg | ORAL_CAPSULE | Freq: Every day | ORAL | 1 refills | Status: DC

## 2020-02-25 ENCOUNTER — Encounter: Payer: Self-pay | Admitting: Internal Medicine

## 2020-03-02 ENCOUNTER — Telehealth: Payer: Self-pay | Admitting: Pharmacist

## 2020-03-02 MED ORDER — PRALUENT 150 MG/ML ~~LOC~~ SOAJ: 1.0000 "pen " | SUBCUTANEOUS | 3 refills | Status: DC

## 2020-03-02 NOTE — Telephone Encounter (Signed)
Prior authorization for Praluent 150mg  (preferred [id number]) has been submitted to patient's [employer] Part D plan and approved through 03/02/21.   Josue Hector, MD  Tarik Teixeira, Harlon Flor, RPH-CPP Can get the ball rolling thanks !!        Previous Messages   ----- Message -----  From: Leeroy Bock, RPH-CPP  Sent: 03/02/2020  3:40 PM EST  To: Josue Hector, MD, Marikay Alar Pinnix, LPN, *   I reached out to his insurance and they'll cover Praluent without an issue. Would you like for me to reach out to him before your visit with him, or wait until after you see him to discuss?  ----- Message -----  From: Josue Hector, MD  Sent: 03/02/2020  2:24 PM EST  To: Marikay Alar Pinnix, LPN, Bernita Raisin, RN, *   New patient 2/18 needs referral to lipid clinic to ? Start PsK-9 and approval for primary prevention I have spoken with Krysten Veronica/ Gerald Stabs See if we can get coronary calcium score and abdominal US medicare screening AAA duplex to find vascular disease prior to visit with me I think they are doing calcium scores at [facility] now

## 2020-03-02 NOTE — Progress Notes (Signed)
CARDIOLOGY CONSULT NOTE       Patient ID: Kyle Dunlap MRN: [id number] DOB/AGE: [birth year] 75 y.o.  Admit date: (Not on file) Referring Physician: Lovena Le Primary Physician: Erven Colla, DO Primary Cardiologist: New Reason for Consultation: HLD  Active Problems:   * No active hospital problems. *   HPI:  75 y.o. referred by dr Lovena Le for HLD in setting of statin intolerance History of HTN, DM, family history of CAD Father having MI Notes joint/muscle pains with lipitor and zocor and elevated LFTls with zetia . No documented vascular disease Lipids 01/01/20 showed TC 312, Triglycerides 244 HDL 39 and LDL 224 I arranged for him prior To visit to consult with our lipid clinic. Megan Supple Pharm D got him approved for Praluent with grant. He will start injections soon. 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 Adenomatous colon polyp 11/2008   Dr. Gala Romney  . Allergy   . Chronic back pain   . DDD (degenerative disc disease), lumbar   . Hemorrhoids, external   . High cholesterol   . HTN (hypertension)   . Hyperplastic colon polyp 11/2008   Dr. Gala Romney  . Kidney stone    "14 years ago" per pt  . Meniere's disease   . Tachyarrhythmia   . Type 2 diabetes mellitus (HCC)     Family History  Problem Relation Age of Onset  . Heart attack Father     Social History   Socioeconomic History  . Marital status: Married    Spouse name: Not on file  . Number of children: Not on file  . Years of education: Not on file  . Highest education level: Not on file  Occupational History  . Not on file  Tobacco Use  . Smoking status: Former Research scientist (life sciences)  . Smokeless tobacco: Former Systems developer    Quit date: 07/14/1985  Vaping Use  .  Vaping Use: Never used  Substance and Sexual Activity  . Alcohol use: Yes    Comment: occasionally  . Drug use: No  . Sexual activity: Yes    Birth control/protection: None  Other Topics Concern  . Not on file  Social History Narrative  . Not on file   Social Determinants of Health   Financial Resource Strain: Not on file  Food Insecurity: Not on file  Transportation Needs: Not on file  Physical Activity: Not on file  Stress: Not on file  Social Connections: Not on file  Intimate Partner Violence: Not on file    Past Surgical History:  Procedure Laterality Date  . CATARACT EXTRACTION W/PHACO Left 09/28/2016   Procedure: CATARACT EXTRACTION PHACO AND INTRAOCULAR LENS PLACEMENT (IOC);  Surgeon: Baruch Goldmann, MD;  Location: [facility];  Service: Ophthalmology;  Laterality: Left;  CDE: 4.40  . CATARACT EXTRACTION W/PHACO Right 10/26/2016   Procedure: CATARACT EXTRACTION PHACO AND INTRAOCULAR LENS PLACEMENT RIGHT EYE;  Surgeon: Baruch Goldmann, MD;  Location: [facility];  Service: Ophthalmology;  Laterality: Right;  CDE: 6.74  . CHOLECYSTECTOMY    . COLONOSCOPY  12/10/2008   Dr. Gala Romney- L side diverticula, adenomatous polyp, hyperplastic polyp  . COLONOSCOPY  11/16/2003   Dr. Laural Golden- performed exam to cecum,3 polyps- no path report available, external hemorrhoids,   .  COLONOSCOPY N/A 01/04/2014   Procedure: COLONOSCOPY;  Surgeon: Daneil Dolin, MD;  Location: [facility];  Service: Endoscopy;  Laterality: N/A;  8:30am  . COLONOSCOPY N/A 02/20/2017   Procedure: COLONOSCOPY;  Surgeon: Daneil Dolin, MD;  Location: [facility];  Service: Endoscopy;  Laterality: N/A;  12:00  . ESOPHAGOGASTRODUODENOSCOPY  08/31/2011   [id number] esophageal erosions consistent with mild erosive reflux esophagitis  . MOUTH SURGERY        Current Outpatient Medications:  .  acetaminophen (TYLENOL) 325 MG tablet, Take 325 mg by mouth 2 (two) times daily as needed for mild pain or headache. , Disp: , Rfl:  .   albuterol (VENTOLIN HFA) 108 (90 Base) MCG/ACT inhaler, TAKE 2 PUFFS BY MOUTH EVERY 6 HOURS AS NEEDED FOR WHEEZE OR SHORTNESS OF BREATH, Disp: 18 g, Rfl: 6 .  Alirocumab (PRALUENT) 150 MG/ML SOAJ, Inject 1 pen into the skin every 14 (fourteen) days., Disp: 6 mL, Rfl: 3 .  Ascorbic Acid (VITAMIN C) 1000 MG tablet, Take 1,000 mg by mouth 2 (two) times daily. , Disp: , Rfl:  .  aspirin 81 MG tablet, Take 81 mg by mouth daily., Disp: , Rfl:  .  diazepam (VALIUM) 2 MG tablet, TAKE ONE TABLET BY MOUTH AT BEDTIME FOR DIZZINESS., Disp: 30 tablet, Rfl: 2 .  diltiazem (TIAZAC) 360 MG 24 hr capsule, Take 1 capsule (360 mg total) by mouth daily., Disp: 90 capsule, Rfl: 1 .  enalapril (VASOTEC) 20 MG tablet, Take one tablet po daily, Disp: 90 tablet, Rfl: 1 .  glipiZIDE (GLUCOTROL) 5 MG tablet, TAKE 2 TABLETS BY MOUTH TWICE DAILY, Disp: 360 tablet, Rfl: 1 .  ibuprofen (ADVIL,MOTRIN) 200 MG tablet, Take 200 mg by mouth every 6 (six) hours as needed for headache or mild pain. , Disp: , Rfl:  .  Multiple Vitamin (MULTIVITAMIN WITH MINERALS) TABS, Take 1 tablet by mouth daily., Disp: , Rfl:  .  pantoprazole (PROTONIX) 40 MG tablet, Take 1 tablet (40 mg total) by mouth daily., Disp: 90 tablet, Rfl: 1 .  sitaGLIPtin (JANUVIA) 50 MG tablet, Take 1 tablet (50 mg total) by mouth daily., Disp: 90 tablet, Rfl: 1 .  triamterene-hydrochlorothiazide (DYAZIDE) 37.5-25 MG capsule, TAKE 1 EACH (1 CAPSULE TOTAL) BY MOUTH DAILY., Disp: 90 capsule, Rfl: 1    Physical Exam: Blood pressure (!) 162/84, pulse 87, height 5\' 9"  (1.753 m), weight 99.4 kg, SpO2 96 %.   Affect appropriate Healthy:  appears stated age 75: normal Neck supple with no adenopathy JVP normal no bruits no thyromegaly Lungs clear with no wheezing and good diaphragmatic motion Heart:  S1/S2 no murmur, no rub, gallop or click PMI normal Abdomen: benighn, BS positve, no tenderness, no AAA no bruit.  No HSM or HJR Distal pulses intact with no bruits No  edema Neuro non-focal Skin warm and dry No muscular weakness   Labs:   Lab Results  Component Value Date   WBC 8.1 01/01/2020   HGB 13.3 01/01/2020   HCT 37.6 01/01/2020   MCV 87 01/01/2020   PLT 293 01/01/2020   No results for input(s): NA, K, CL, CO2, BUN, CREATININE, CALCIUM, PROT, BILITOT, ALKPHOS, ALT, AST, GLUCOSE in the last 168 hours.  Invalid input(s): LABALBU No results found for: CKTOTAL, CKMB, CKMBINDEX, TROPONINI  Lab Results  Component Value Date   CHOL 312 (H) 01/01/2020   CHOL 291 (H) 12/23/2018   CHOL 284 (H) 03/10/2018   Lab Results  Component Value Date  HDL 39 (L) 01/01/2020   HDL 34 (L) 12/23/2018   HDL 37 (L) 03/10/2018   Lab Results  Component Value Date   LDLCALC 224 (H) 01/01/2020   LDLCALC 201 (H) 12/23/2018   LDLCALC 202 (H) 03/10/2018   Lab Results  Component Value Date   TRIG 244 (H) 01/01/2020   TRIG 279 (H) 12/23/2018   TRIG 227 (H) 03/10/2018   Lab Results  Component Value Date   CHOLHDL 8.0 (H) 01/01/2020   CHOLHDL 8.6 (H) 12/23/2018   CHOLHDL 7.7 (H) 03/10/2018   No results found for: LDLDIRECT    Radiology: US AORTA  Result Date: 03/09/2020 CLINICAL DATA:  Vascular disease, hypertension, type II diabetes mellitus, former smoker EXAM: ULTRASOUND OF ABDOMINAL AORTA TECHNIQUE: Ultrasound examination of the abdominal aorta and proximal common iliac arteries was performed to evaluate for aneurysm. Additional color and Doppler images of the distal aorta were obtained to document patency. COMPARISON:  CT abdomen and pelvis 04/04/2012 FINDINGS: Abdominal aortic measurements as follows: Proximal:  3.1 x 3.0 cm cm Mid:  2.7 x 2.3 cm cm Distal:  1.5 x 1.7 cm cm Patent: Yes, peak systolic velocity is 916 cm/s Scattered atherosclerotic plaques are identified. Right common iliac artery: 1.1 x 1.1 cm Left common iliac artery: 1.1 x 1.1 cm IMPRESSION: Scattered atherosclerotic plaques in the abdominal aorta. No evidence of aortic aneurysm.  Electronically Signed   By: Lavonia Dana M.D.   On: 03/09/2020 16:03    EKG: 2018 NSR Q lead 3 nonspecific ST changes    ASSESSMENT AND PLAN:   1. HLD:  In setting of DM and no documented vascular disease Intolerant to two statins and elevated LFTls with zetia. Start praluent f/u labs 3 months coronary calcium score to further risk stratify CAD   2. HTN:  Continue cardizem, ACE and diuretic Follow Cr currently 1.6   3. DM:  Discussed low carb diet.  Target hemoglobin A1c is 6.5 or less.  Continue current medications. A1c not ideally controlled at 7.1   F/U lipid clinic  Coronary calcium score  Praluent   Signed: Jenkins Rouge 03/11/2020, 10:39 AM

## 2020-03-02 NOTE — Telephone Encounter (Signed)
Called pt and discussed Praluent which he is agreeable to try. Also applied for [employer] which pt was approved for and will make his Praluent free. Rx sent to pharmacy, counseled pt on the phone regarding injection technique. Provided him with my direct # if he has trouble with his first injection, advised him that I'd be happy to see him in person to help coach him through his first injection if needed. Will need lipids scheduled in [place] in another ~2-3 months to assess efficacy.

## 2020-03-03 ENCOUNTER — Telehealth: Payer: Self-pay | Admitting: *Deleted

## 2020-03-03 DIAGNOSIS — I999 Unspecified disorder of circulatory system: Secondary | ICD-10-CM

## 2020-03-03 DIAGNOSIS — I2510 Atherosclerotic heart disease of native coronary artery without angina pectoris: Secondary | ICD-10-CM

## 2020-03-03 DIAGNOSIS — E785 Hyperlipidemia, unspecified: Secondary | ICD-10-CM

## 2020-03-03 NOTE — Telephone Encounter (Signed)
Orders placed. Called to notify pt. No answer. Left msg to call back.

## 2020-03-03 NOTE — Telephone Encounter (Signed)
-----   Message from Josue Hector, MD sent at 03/02/2020  2:21 PM EST ----- New patient 2/18 needs referral to lipid clinic to ? Start PsK-9 and approval for primary prevention I have spoken with Megan/ Gerald Stabs See if we can get coronary calcium score and abdominal US medicare screening AAA duplex to find vascular disease prior to visit with me I think they are doing calcium scores at [facility] now

## 2020-03-08 ENCOUNTER — Telehealth: Payer: Self-pay

## 2020-03-08 DIAGNOSIS — I999 Unspecified disorder of circulatory system: Secondary | ICD-10-CM

## 2020-03-08 NOTE — Telephone Encounter (Signed)
New order placed for [place] of Aorta d/t vascular disease per Dr Johnsie Cancel.

## 2020-03-08 NOTE — Telephone Encounter (Signed)
Ok to change order?

## 2020-03-08 NOTE — Telephone Encounter (Signed)
Spoke with Kyle Dunlap, [facility] [place] who states pt does not qualify for Aorta Medicare Screening due to pt being 75 years of age.  If evaluation still needs to be completed order will need to be changed to [place] of Aorta which will be the same testing but should be covered by Medicare.

## 2020-03-09 ENCOUNTER — Ambulatory Visit (HOSPITAL_COMMUNITY)
Admission: RE | Admit: 2020-03-09 | Discharge: 2020-03-09 | Disposition: A | Discharge status: Home / Self Care | Payer: Medicare Other | Source: Ambulatory Visit | Attending: Cardiovascular Disease | Admitting: Cardiovascular Disease

## 2020-03-09 ENCOUNTER — Other Ambulatory Visit: Payer: Self-pay

## 2020-03-09 DIAGNOSIS — Z87891 Personal history of nicotine dependence: Secondary | ICD-10-CM | POA: Diagnosis not present

## 2020-03-09 DIAGNOSIS — E119 Type 2 diabetes mellitus without complications: Secondary | ICD-10-CM | POA: Diagnosis not present

## 2020-03-09 DIAGNOSIS — I999 Unspecified disorder of circulatory system: Secondary | ICD-10-CM | POA: Diagnosis not present

## 2020-03-09 DIAGNOSIS — I714 Abdominal aortic aneurysm, without rupture: Secondary | ICD-10-CM | POA: Insufficient documentation

## 2020-03-09 DIAGNOSIS — I10 Essential (primary) hypertension: Secondary | ICD-10-CM | POA: Insufficient documentation

## 2020-03-09 DIAGNOSIS — I700 Atherosclerosis of aorta: Secondary | ICD-10-CM | POA: Insufficient documentation

## 2020-03-10 ENCOUNTER — Ambulatory Visit (HOSPITAL_COMMUNITY): Payer: Medicare Other

## 2020-03-11 ENCOUNTER — Encounter: Payer: Self-pay | Admitting: Cardiovascular Disease

## 2020-03-11 ENCOUNTER — Ambulatory Visit: Payer: Medicare Other | Admitting: Cardiovascular Disease

## 2020-03-11 ENCOUNTER — Other Ambulatory Visit: Payer: Self-pay

## 2020-03-11 VITALS — BP 162/84 | HR 87 | Ht 69.0 in | Wt 219.2 lb

## 2020-03-11 DIAGNOSIS — I2510 Atherosclerotic heart disease of native coronary artery without angina pectoris: Secondary | ICD-10-CM | POA: Diagnosis not present

## 2020-03-11 DIAGNOSIS — E785 Hyperlipidemia, unspecified: Secondary | ICD-10-CM | POA: Diagnosis not present

## 2020-03-11 DIAGNOSIS — I2583 Coronary atherosclerosis due to lipid rich plaque: Secondary | ICD-10-CM | POA: Diagnosis not present

## 2020-03-11 DIAGNOSIS — I10 Essential (primary) hypertension: Secondary | ICD-10-CM | POA: Diagnosis not present

## 2020-03-11 NOTE — Patient Instructions (Signed)
Medication Instructions:  Your physician recommends that you continue on your current medications as directed. Please refer to the Current Medication list given to you today.  *If you need a refill on your cardiac medications before your next appointment, please call your pharmacy*   Lab Work: None today If you have labs (blood work) drawn today and your tests are completely normal, you will receive your results only by: Marland Kitchen MyChart Message (if you have MyChart) OR . A paper copy in the mail If you have any lab test that is abnormal or we need to change your treatment, we will call you to review the results.   Testing/Procedures: None today   Follow-Up: At [facility], you and your health needs are our priority.  As part of our continuing mission to provide you with exceptional heart care, we have created designated Provider Care Teams.  These Care Teams include your primary Cardiologist (physician) and Advanced Practice Providers (APPs -  Physician Assistants and Nurse Practitioners) who all work together to provide you with the care you need, when you need it.  We recommend signing up for the patient portal called "MyChart".  Sign up information is provided on this After Visit Summary.  MyChart is used to connect with patients for Virtual Visits (Telemedicine).  Patients are able to view lab/test results, encounter notes, upcoming appointments, etc.  Non-urgent messages can be sent to your provider as well.   To learn more about what you can do with MyChart, go to [email or web address].    Your next appointment:   6 month(s)  The format for your next appointment:   In Person  Provider:   Jenkins Rouge, MD   Other Instructions Please schedule 8 week apt with lipid Clinic   Please schedule cardiac calcium score at [facility]

## 2020-03-17 ENCOUNTER — Ambulatory Visit: Payer: Medicare Other

## 2020-03-17 ENCOUNTER — Inpatient Hospital Stay: Admission: RE | Admit: 2020-03-17 | Payer: Medicare Other | Source: Ambulatory Visit

## 2020-03-24 ENCOUNTER — Other Ambulatory Visit: Payer: Self-pay | Admitting: Family Medicine

## 2020-03-24 DIAGNOSIS — I10 Essential (primary) hypertension: Secondary | ICD-10-CM

## 2020-04-01 ENCOUNTER — Ambulatory Visit (INDEPENDENT_AMBULATORY_CARE_PROVIDER_SITE_OTHER)
Admission: RE | Admit: 2020-04-01 | Discharge: 2020-04-01 | Disposition: A | Discharge status: Home / Self Care | Payer: Self-pay | Source: Ambulatory Visit | Attending: Cardiovascular Disease | Admitting: Cardiovascular Disease

## 2020-04-01 ENCOUNTER — Other Ambulatory Visit: Payer: Self-pay

## 2020-04-01 DIAGNOSIS — I999 Unspecified disorder of circulatory system: Secondary | ICD-10-CM

## 2020-05-06 ENCOUNTER — Ambulatory Visit: Payer: Medicare Other | Admitting: Pharmacist

## 2020-05-06 ENCOUNTER — Other Ambulatory Visit: Payer: Self-pay

## 2020-05-06 ENCOUNTER — Other Ambulatory Visit: Payer: Self-pay | Admitting: Cardiovascular Disease

## 2020-05-06 DIAGNOSIS — T466X5A Adverse effect of antihyperlipidemic and antiarteriosclerotic drugs, initial encounter: Secondary | ICD-10-CM | POA: Diagnosis not present

## 2020-05-06 DIAGNOSIS — G720 Drug-induced myopathy: Secondary | ICD-10-CM

## 2020-05-06 DIAGNOSIS — E785 Hyperlipidemia, unspecified: Secondary | ICD-10-CM

## 2020-05-06 DIAGNOSIS — I700 Atherosclerosis of aorta: Secondary | ICD-10-CM | POA: Diagnosis not present

## 2020-05-06 MED ORDER — ICOSAPENT ETHYL 1 G PO CAPS: 2.0000 g | ORAL_CAPSULE | Freq: Two times a day (BID) | ORAL | 3 refills | Status: DC

## 2020-05-06 NOTE — Patient Instructions (Addendum)
It was nice to see you today  Your baseline LDL is 224 and your goal is < 70  Continue taking your Praluent injections every 2 weeks - we'll check an updated lipid panel today  Your triglycerides are 244 and your goal is < 150. I'll submit information to your insurance to see if they will cover Vascepa 2 capsules twice daily (prescription fish oil) that will help to lower your triglycerides and also reduce your risk of a heart attack or stroke. I'll give you a call when I hear back from your insurance about this medication.

## 2020-05-06 NOTE — Progress Notes (Signed)
Patient ID: Kyle Dunlap                 DOB: 08-31-1945                    MRN: [id number]     HPI: Kyle Dunlap is a 75 y.o. male patient referred to lipid clinic by Dr Kyle Dunlap. PMH is significant for HTN, DM, family history of CAD, and statin intolerance. He underwent abdominal ultrasound on 03/09/20 which showed scattered atherosclerotic plaques in the abdominal aorta. Subsequent coronary artery score on 04/01/20 showed aortic atherosclerosis with a calcium score of 42 (20th percentile for age and sex). I was able to get pt approved for Praluent along with a grant through the [employer] in February. Pt has not had follow up labs drawn yet.  Pt presents today in good spirits. Reports tolerating Praluent injections well, he has completed 3 so far. Previously took 2 statins which caused joint and muscle pain that affected his ability to walk. Zetia caused LFT elevations. He has a family history of heart disease - his father passed away at age 36 from an MI. Reports his fasting glucose is generally < 125.  Current Medications: Praluent 150mg  Q2W Intolerances: atorvastatin, simvastatin - joint and muscle pain; ezetimibe - elevated LFTs Risk Factors: aortic atherosclerosis, DM, fam hx of CAD, baseline LDL > 220 LDL goal: 70mg /dL  Diet: Likes chicken and fish, sometimes has a steak or burger. Tries to watch his carbs, likes vegetables. Drinks coffee with Splenda and cream, and Sprite (regular - 3 cans per week). Does like sweets, tries to limit though.  Exercise: Not much, has hip issues.   Family History: Father with MI  Social History: Former smoker (quit in 1987), occasional alcohol use, denies drug use.  Labs: 01/01/20: TC 312, TG 244, HDL 39, LDL 224 (no lipid lowering therapy)  Past Medical History:  Diagnosis Date  . Adenomatous colon polyp 11/2008   Dr. Gala Romney  . Allergy   . Chronic back pain   . DDD (degenerative disc disease), lumbar   . Hemorrhoids, external    . High cholesterol   . HTN (hypertension)   . Hyperplastic colon polyp 11/2008   Dr. Gala Romney  . Kidney stone    "14 years ago" per pt  . Meniere's disease   . Tachyarrhythmia   . Type 2 diabetes mellitus ([place])     Current Outpatient Medications on File Prior to Visit  Medication Sig Dispense Refill  . acetaminophen (TYLENOL) 325 MG tablet Take 325 mg by mouth 2 (two) times daily as needed for mild pain or headache.     . albuterol (VENTOLIN HFA) 108 (90 Base) MCG/ACT inhaler TAKE 2 PUFFS BY MOUTH EVERY 6 HOURS AS NEEDED FOR WHEEZE OR SHORTNESS OF BREATH 18 g 6  . Alirocumab (PRALUENT) 150 MG/ML SOAJ Inject 1 pen into the skin every 14 (fourteen) days. 6 mL 3  . Ascorbic Acid (VITAMIN C) 1000 MG tablet Take 1,000 mg by mouth 2 (two) times daily.     Marland Kitchen aspirin 81 MG tablet Take 81 mg by mouth daily.    . diazepam (VALIUM) 2 MG tablet TAKE ONE TABLET BY MOUTH AT BEDTIME FOR DIZZINESS. 30 tablet 2  . diltiazem (TIAZAC) 360 MG 24 hr capsule Take 1 capsule (360 mg total) by mouth daily. 90 capsule 1  . enalapril (VASOTEC) 20 MG tablet Take one tablet po daily 90 tablet 1  . glipiZIDE (GLUCOTROL) 5  MG tablet TAKE 2 TABLETS BY MOUTH TWICE DAILY 360 tablet 1  . ibuprofen (ADVIL,MOTRIN) 200 MG tablet Take 200 mg by mouth every 6 (six) hours as needed for headache or mild pain.     . Multiple Vitamin (MULTIVITAMIN WITH MINERALS) TABS Take 1 tablet by mouth daily.    . pantoprazole (PROTONIX) 40 MG tablet Take 1 tablet (40 mg total) by mouth daily. 90 tablet 1  . sitaGLIPtin (JANUVIA) 50 MG tablet Take 1 tablet (50 mg total) by mouth daily. 90 tablet 1  . triamterene-hydrochlorothiazide (MAXZIDE-25) 37.5-25 MG tablet Take 1 tab p.o. daily. 90 tablet 0   No current facility-administered medications on file prior to visit.    Allergies  Allergen Reactions  . Atorvastatin Other (See Comments)    Joint and muscle aches Other reaction(s): Weakness present  . Zetia [Ezetimibe] Other (See Comments)     Elevated liver enzymes  . Zocor [Simvastatin] Other (See Comments)    Joint and muscle aches  . Codeine Other (See Comments)    Passed out    Assessment/Plan:  1. Hyperlipidemia - baseline LDL 224 above goal < 70 given aortic atherosclerosis. Pt is intolerant to 2 statins and ezetimibe. Checking lipid panel today since pt has completed 3 Praluent injections. Will also start Vascepa 2g BID to lower TG and CV risk. Pending lab results, may also need to add Nexletol if pt is willing. Pt enrolled in [place] to assist with lipid med copays.  Juna Caban E. Arine Foley, PharmD, BCACP, [place] [address] [place], [place] [address] Phone: [phone]; Fax: [phone] 05/06/2020 1:56 PM

## 2020-05-07 LAB — LIPID PANEL
Chol/HDL Ratio: 4.6 ratio (ref 0.0–5.0)
Cholesterol, Total: 174 mg/dL (ref 100–199)
HDL: 38 mg/dL — ABNORMAL LOW (ref >39)
LDL Chol Calc (NIH): 103 mg/dL — ABNORMAL HIGH (ref 0–99)
Triglycerides: 188 mg/dL — ABNORMAL HIGH (ref 0–149)
VLDL Cholesterol Cal: 33 mg/dL (ref 5–40)

## 2020-05-07 LAB — HEPATIC FUNCTION PANEL
ALT: 33 IU/L (ref 0–44)
AST: 19 IU/L (ref 0–40)
Albumin: 4.6 g/dL (ref 3.7–4.7)
Alkaline Phosphatase: 102 IU/L (ref 44–121)
Bilirubin Total: 0.3 mg/dL (ref 0.0–1.2)
Bilirubin, Direct: 0.13 mg/dL (ref 0.00–0.40)
Total Protein: 7.6 g/dL (ref 6.0–8.5)

## 2020-05-09 ENCOUNTER — Telehealth: Payer: Self-pay | Admitting: Pharmacist

## 2020-05-09 DIAGNOSIS — T466X5A Adverse effect of antihyperlipidemic and antiarteriosclerotic drugs, initial encounter: Secondary | ICD-10-CM | POA: Insufficient documentation

## 2020-05-09 DIAGNOSIS — G720 Drug-induced myopathy: Secondary | ICD-10-CM | POA: Insufficient documentation

## 2020-05-09 NOTE — Telephone Encounter (Signed)
Discussed lipid panel results with pt. LDL has dropped 55% since starting Praluent 150mg  Q2W injections which pt is tolerating well. He's intolerant to multiple statins (myalgias) and Zetia (LFT increase). His LDL has improved notably but is still above goal < 70 given his aortic atherosclerosis. Discussed adding Nexletol, however pt doesn't wish to start another pill at this time. I added Vascepa at his visit last week since his TG have historically run in the mid 250s. They actually improved to 188, have encouraged pt to still start Vascepa to bring his TG to goal and help with CV risk reduction as well.  He is agreeable to continuing Praluent injections and Vascepa.

## 2020-06-03 ENCOUNTER — Telehealth: Payer: Self-pay

## 2020-06-03 DIAGNOSIS — Z79899 Other long term (current) drug therapy: Secondary | ICD-10-CM

## 2020-06-03 NOTE — Telephone Encounter (Signed)
Pt has appt 06/08 for a follow up will he need blood work   Pt call back [phone]

## 2020-06-03 NOTE — Telephone Encounter (Signed)
Had lipid and liver on 05/06/20

## 2020-06-05 NOTE — Telephone Encounter (Signed)
Would need bmp.  Fasting.  Thx. Dr. Lovena Le

## 2020-06-06 NOTE — Telephone Encounter (Signed)
Left message to return call 

## 2020-06-06 NOTE — Telephone Encounter (Signed)
Bw orders put in and left message to return call

## 2020-06-07 ENCOUNTER — Other Ambulatory Visit: Payer: Self-pay | Admitting: Family Medicine

## 2020-06-07 DIAGNOSIS — E119 Type 2 diabetes mellitus without complications: Secondary | ICD-10-CM

## 2020-06-10 ENCOUNTER — Other Ambulatory Visit: Payer: Self-pay | Admitting: Family Medicine

## 2020-06-10 DIAGNOSIS — E119 Type 2 diabetes mellitus without complications: Secondary | ICD-10-CM

## 2020-06-10 DIAGNOSIS — I10 Essential (primary) hypertension: Secondary | ICD-10-CM

## 2020-06-13 NOTE — Telephone Encounter (Signed)
Bw orders put in and pt was notified.

## 2020-06-22 ENCOUNTER — Other Ambulatory Visit: Payer: Self-pay | Admitting: Family Medicine

## 2020-06-22 DIAGNOSIS — I10 Essential (primary) hypertension: Secondary | ICD-10-CM

## 2020-06-22 NOTE — Telephone Encounter (Signed)
Lab Results  Component Value Date   HGBA1C 7.1 (H) 01/01/2020    Lab Results  Component Value Date   CREATININE 1.60 (H) 01/01/2020     Lab Results  Component Value Date   CHOL 174 05/06/2020   HDL 38 (L) 05/06/2020   LDLCALC 103 (H) 05/06/2020   TRIG 188 (H) 05/06/2020   CHOLHDL 4.6 05/06/2020     BP Readings from Last 3 Encounters:  03/11/20 (!) 162/84  01/01/20 138/82  06/05/19 (!) 168/90

## 2020-06-23 LAB — BASIC METABOLIC PANEL
BUN/Creatinine Ratio: 14 (ref 10–24)
BUN: 29 mg/dL — ABNORMAL HIGH (ref 8–27)
CO2: 18 mmol/L — ABNORMAL LOW (ref 20–29)
Calcium: 9.3 mg/dL (ref 8.6–10.2)
Chloride: 105 mmol/L (ref 96–106)
Creatinine, Ser: 2.12 mg/dL — ABNORMAL HIGH (ref 0.76–1.27)
Glucose: 135 mg/dL — ABNORMAL HIGH (ref 65–99)
Potassium: 5 mmol/L (ref 3.5–5.2)
Sodium: 138 mmol/L (ref 134–144)
eGFR: 32 mL/min/{1.73_m2} — ABNORMAL LOW (ref >59)

## 2020-06-29 ENCOUNTER — Telehealth: Payer: Self-pay

## 2020-06-29 ENCOUNTER — Other Ambulatory Visit: Payer: Self-pay

## 2020-06-29 ENCOUNTER — Ambulatory Visit: Payer: Medicare Other | Admitting: Family Medicine

## 2020-06-29 VITALS — BP 180/68 | HR 65 | Temp 98.4°F | Ht 69.0 in | Wt 221.0 lb

## 2020-06-29 DIAGNOSIS — N183 Chronic kidney disease, stage 3 unspecified: Secondary | ICD-10-CM

## 2020-06-29 DIAGNOSIS — E1121 Type 2 diabetes mellitus with diabetic nephropathy: Secondary | ICD-10-CM | POA: Diagnosis not present

## 2020-06-29 DIAGNOSIS — I10 Essential (primary) hypertension: Secondary | ICD-10-CM

## 2020-06-29 DIAGNOSIS — Z789 Other specified health status: Secondary | ICD-10-CM

## 2020-06-29 DIAGNOSIS — E785 Hyperlipidemia, unspecified: Secondary | ICD-10-CM | POA: Diagnosis not present

## 2020-06-29 NOTE — Telephone Encounter (Signed)
Patient told to call back with BP reading from this afternoon.  His BP is 138/68.

## 2020-06-29 NOTE — Telephone Encounter (Signed)
Please advise. Thank you

## 2020-06-29 NOTE — Telephone Encounter (Signed)
Ok great thanks! °

## 2020-06-29 NOTE — Progress Notes (Signed)
Patient ID: Kyle Dunlap, male    DOB: October 29, 1945, 75 y.o.   MRN: [id number]   Chief Complaint  Patient presents with   Diabetes   Hypertension   Subjective:    HPI F/u dm2, htn, hld-  Dm2- Compliant with medications. Checking blood glucose.   Not seeing any high or low numbers.  Denies polyuria or polydipsia.  Eye exam: up to date Foot exam: no new concerns.  HLD- doing well no new concerns.  Compliant with meds. No chest pain, palpitations, myalgias or joint pains. Taking praluent and vascepa to help with cholesterol.  Seeing Dr. Johnsie Cancel, cardiology. Seeing them in 8/22  HTN Pt compliant with BP meds.  No SEs Denies chest pain, sob, LE swelling, or blurry vision. Didn't have triamtere/hctz yet today Enalapril at night. Had diltiazem this am.  158/70 when checking at home.  Normally around 140s/70s.   Noticing taking gabapentin noticing hands and feet are more swollen.   BP Readings from Last 3 Encounters:  06/29/20 (!) 180/68  03/11/20 (!) 162/84  01/01/20 138/82    Medical History Keita has a past medical history of Adenomatous colon polyp (11/2008), Allergy, Chronic back pain, DDD (degenerative disc disease), lumbar, Hemorrhoids, external, High cholesterol, HTN (hypertension), Hyperplastic colon polyp (11/2008), Kidney stone, Meniere's disease, Tachyarrhythmia, and Type 2 diabetes mellitus ([place]).   Outpatient Encounter Medications as of 06/29/2020  Medication Sig   acetaminophen (TYLENOL) 325 MG tablet Take 325 mg by mouth 2 (two) times daily as needed for mild pain or headache.    albuterol (VENTOLIN HFA) 108 (90 Base) MCG/ACT inhaler TAKE 2 PUFFS BY MOUTH EVERY 6 HOURS AS NEEDED FOR WHEEZE OR SHORTNESS OF BREATH   Alirocumab (PRALUENT) 150 MG/ML SOAJ Inject 1 pen into the skin every 14 (fourteen) days.   Ascorbic Acid (VITAMIN C) 1000 MG tablet Take 1,000 mg by mouth 2 (two) times daily.    aspirin 81 MG tablet Take 81 mg by mouth daily.    celecoxib (CELEBREX) 100 MG capsule Take by mouth.   diazepam (VALIUM) 2 MG tablet TAKE ONE TABLET BY MOUTH AT BEDTIME FOR DIZZINESS.   diltiazem (TIAZAC) 360 MG 24 hr capsule Take 1 capsule (360 mg total) by mouth daily.   enalapril (VASOTEC) 20 MG tablet TAKE 1 TABLET BY MOUTH EVERY DAY   gabapentin (NEURONTIN) 300 MG capsule Take 1 capsule by mouth at bedtime.   glipiZIDE (GLUCOTROL) 5 MG tablet TAKE 2 TABLETS BY MOUTH TWICE A DAY   ibuprofen (ADVIL,MOTRIN) 200 MG tablet Take 200 mg by mouth every 6 (six) hours as needed for headache or mild pain.    icosapent Ethyl (VASCEPA) 1 g capsule Take 2 capsules (2 g total) by mouth 2 (two) times daily.   JANUVIA 50 MG tablet TAKE 1 TABLET BY MOUTH EVERY DAY   Multiple Vitamin (MULTIVITAMIN WITH MINERALS) TABS Take 1 tablet by mouth daily.   Omega-3 Fatty Acids (FISH OIL) 1000 MG CAPS Take by mouth.   triamterene-hydrochlorothiazide (MAXZIDE-25) 37.5-25 MG tablet TAKE 1 TABLET BY MOUTH EVERY DAY   [DISCONTINUED] pantoprazole (PROTONIX) 40 MG tablet Take 1 tablet (40 mg total) by mouth daily.   No facility-administered encounter medications on file as of 06/29/2020.     Review of Systems  Constitutional:  Negative for chills and fever.  HENT:  Negative for congestion, rhinorrhea and sore throat.   Respiratory:  Negative for cough, shortness of breath and wheezing.   Cardiovascular:  Negative for chest pain and leg  swelling.  Gastrointestinal:  Negative for abdominal pain, diarrhea, nausea and vomiting.  Genitourinary:  Negative for dysuria and frequency.  Skin:  Negative for rash.  Neurological:  Negative for dizziness, weakness and headaches.    Vitals BP (!) 180/68   Pulse 65   Temp 98.4 F (36.9 C)   Ht 5\' 9"  (1.753 m)   Wt 221 lb (100.2 kg)   SpO2 98%   BMI 32.64 kg/m   Objective:   Physical Exam Vitals and nursing note reviewed.  Constitutional:      General: He is not in acute distress.    Appearance: Normal appearance. He  is not ill-appearing.  HENT:     Head: Normocephalic.     Nose: Nose normal. No congestion.     Mouth/Throat:     Mouth: Mucous membranes are moist.     Pharynx: No oropharyngeal exudate.  Eyes:     Extraocular Movements: Extraocular movements intact.     Conjunctiva/sclera: Conjunctivae normal.     Pupils: Pupils are equal, round, and reactive to light.  Cardiovascular:     Rate and Rhythm: Normal rate and regular rhythm.     Pulses: Normal pulses.     Heart sounds: Normal heart sounds. No murmur heard. Pulmonary:     Effort: Pulmonary effort is normal.     Breath sounds: Normal breath sounds. No wheezing, rhonchi or rales.  Musculoskeletal:        General: Normal range of motion.     Right lower leg: No edema.     Left lower leg: No edema.  Skin:    General: Skin is warm and dry.     Findings: No rash.  Neurological:     General: No focal deficit present.     Mental Status: He is alert and oriented to person, place, and time.     Cranial Nerves: No cranial nerve deficit.  Psychiatric:        Mood and Affect: Mood normal.        Behavior: Behavior normal.        Thought Content: Thought content normal.        Judgment: Judgment normal.     Assessment and Plan   1. Type 2 diabetes mellitus with diabetic nephropathy, without long-term current use of insulin (HCC) - Hemoglobin A1c - Microalbumin, urine  2. Essential hypertension, benign  3. Hyperlipidemia LDL goal <70  4. Stage 3 chronic kidney disease, unspecified whether stage 3a or 3b CKD (HCC)  5. Statin intolerance   DM2- stable.  A1c at 7.5.  Cont meds.  Htn- uncontrolled.  Pt to take medications as directed.  F/u with cards as scheduled. Check bp at home. Pt to check bp today after taking other bp med and call with numbers.  Hld- stable, cont to take medications per cardiology. Pt taking praluent injections.  Pt has h/o statin intolerance and not able to take zetia due to elevated LFTs.  Ckd stage 3-  worsening. Cr elevated at 2.12, last check was 1.6 in 12/21. Has ranged btw 1.87-2.12 in past 2 yrs.  Will cont to monitor and may need referral to nephrologist. Pt to cont to take bp meds as directed.   Return in about 6 months (around 12/29/2020) for f/u htn.   07/10/2020

## 2020-06-29 NOTE — Patient Instructions (Addendum)
Take triamteren today and check blood pressure around 4pm, call office with numbers.  Check daily after that and call next week with bp numbers.    Goal is 120/80 or under.  But if your 140's or 80s then ok for now.

## 2020-06-30 LAB — MICROALBUMIN, URINE: Microalbumin, Urine: 50.6 ug/mL

## 2020-06-30 LAB — HEMOGLOBIN A1C
Est. average glucose Bld gHb Est-mCnc: 169 mg/dL
Hgb A1c MFr Bld: 7.5 % — ABNORMAL HIGH (ref 4.8–5.6)

## 2020-07-04 ENCOUNTER — Other Ambulatory Visit: Payer: Self-pay | Admitting: Family Medicine

## 2020-07-09 NOTE — Progress Notes (Signed)
Primary Care Physician:  Erven Colla, DO Primary Gastroenterologist:  Dr. Gala Romney  Chief Complaint  Patient presents with   Colonoscopy    HPI:   Kyle Dunlap is a 75 y.o. male with history of multiple colonic adenomas presenting today to schedule surveillance colonoscopy. Last colonoscopy in January 2019 with multiple colon polyps ranging 3 to 9 mm in size.  Pathology with tubular adenomas.  Recommended repeat in 3 years.  Bowels are moving daily without constipation or diarrhea.  Denies bright red blood per rectum, melena, unintentional weight loss. GERD well-controlled on Protonix 40 mg daily. No dysphaiga.   Valium as needed for dizzy spells. Maybe once every couple of months.   States BP is elevated when coming to the doctor.  Monitors BP at home and remains less than 140/80.  Past Medical History:  Diagnosis Date   Adenomatous colon polyp 11/2008   Dr. Gala Romney   Allergy    Chronic back pain    DDD (degenerative disc disease), lumbar    Hemorrhoids, external    High cholesterol    HTN (hypertension)    Hyperplastic colon polyp 11/2008   Dr. Gala Romney   Kidney stone    "14 years ago" per pt   Meniere's disease    Tachyarrhythmia    Type 2 diabetes mellitus [facility])     Past Surgical History:  Procedure Laterality Date   CATARACT EXTRACTION W/PHACO Left 09/28/2016   Procedure: CATARACT EXTRACTION PHACO AND INTRAOCULAR LENS PLACEMENT (IOC);  Surgeon: Baruch Goldmann, MD;  Location: [facility];  Service: Ophthalmology;  Laterality: Left;  CDE: 4.40   CATARACT EXTRACTION W/PHACO Right 10/26/2016   Procedure: CATARACT EXTRACTION PHACO AND INTRAOCULAR LENS PLACEMENT RIGHT EYE;  Surgeon: Baruch Goldmann, MD;  Location: [facility];  Service: Ophthalmology;  Laterality: Right;  CDE: 6.74   CHOLECYSTECTOMY     COLONOSCOPY  12/10/2008   Dr. Gala Romney- L side diverticula, adenomatous polyp, hyperplastic polyp   COLONOSCOPY  11/16/2003   Dr. Laural Golden- performed exam to cecum,3 polyps- no  path report available, external hemorrhoids,    COLONOSCOPY N/A 01/04/2014   Procedure: COLONOSCOPY;  Surgeon: Daneil Dolin, MD;  Location: [facility];  Service: Endoscopy;  Laterality: N/A;  8:30am   COLONOSCOPY N/A 02/20/2017   Surgeon: Daneil Dolin, MD; multiple colon polyps ranging 3 to 9 mm in size.  Pathology with tubular adenomas.  Recommended repeat in 3 years.   ESOPHAGOGASTRODUODENOSCOPY  08/31/2011   NFA:OZHYQM esophageal erosions consistent with mild erosive reflux esophagitis   MOUTH SURGERY      Current Outpatient Medications  Medication Sig Dispense Refill   acetaminophen (TYLENOL) 325 MG tablet Take 325 mg by mouth 2 (two) times daily as needed for mild pain or headache.      albuterol (VENTOLIN HFA) 108 (90 Base) MCG/ACT inhaler TAKE 2 PUFFS BY MOUTH EVERY 6 HOURS AS NEEDED FOR WHEEZE OR SHORTNESS OF BREATH 18 g 6   Alirocumab (PRALUENT) 150 MG/ML SOAJ Inject 1 pen into the skin every 14 (fourteen) days. 6 mL 3   Ascorbic Acid (VITAMIN C) 1000 MG tablet Take 1,000 mg by mouth 2 (two) times daily.      aspirin 81 MG tablet Take 81 mg by mouth daily.     celecoxib (CELEBREX) 100 MG capsule Take by mouth.     diazepam (VALIUM) 2 MG tablet TAKE ONE TABLET BY MOUTH AT BEDTIME FOR DIZZINESS. 30 tablet 2   diltiazem (TIAZAC) 360 MG 24 hr  capsule Take 1 capsule (360 mg total) by mouth daily. 90 capsule 1   enalapril (VASOTEC) 20 MG tablet TAKE 1 TABLET BY MOUTH EVERY DAY 90 tablet 0   gabapentin (NEURONTIN) 300 MG capsule Take 1 capsule by mouth at bedtime.     glipiZIDE (GLUCOTROL) 5 MG tablet TAKE 2 TABLETS BY MOUTH TWICE A DAY 360 tablet 0   ibuprofen (ADVIL,MOTRIN) 200 MG tablet Take 200 mg by mouth every 6 (six) hours as needed for headache or mild pain.      icosapent Ethyl (VASCEPA) 1 g capsule Take 2 capsules (2 g total) by mouth 2 (two) times daily. 360 capsule 3   JANUVIA 50 MG tablet TAKE 1 TABLET BY MOUTH EVERY DAY 90 tablet 0   Multiple Vitamin (MULTIVITAMIN  WITH MINERALS) TABS Take 1 tablet by mouth daily.     Omega-3 Fatty Acids (FISH OIL) 1000 MG CAPS Take by mouth.     pantoprazole (PROTONIX) 40 MG tablet TAKE 1 TABLET BY MOUTH EVERY DAY 90 tablet 1   triamterene-hydrochlorothiazide (MAXZIDE-25) 37.5-25 MG tablet TAKE 1 TABLET BY MOUTH EVERY DAY 30 tablet 0   No current facility-administered medications for this visit.    Allergies as of 07/11/2020 - Review Complete 07/11/2020  Allergen Reaction Noted   Atorvastatin Other (See Comments) 07/11/2012   Zetia [ezetimibe] Other (See Comments) 07/11/2012   Zocor [simvastatin] Other (See Comments) 07/11/2012   Codeine Other (See Comments) 08/03/2011    Family History  Problem Relation Age of Onset   Heart attack Father    Colon cancer Brother 36    Social History   Socioeconomic History   Marital status: Married    Spouse name: Not on file   Number of children: Not on file   Years of education: Not on file   Highest education level: Not on file  Occupational History   Not on file  Tobacco Use   Smoking status: Former    Pack years: 0.00   Smokeless tobacco: Former    Quit date: 07/14/1985  Vaping Use   Vaping Use: Never used  Substance and Sexual Activity   Alcohol use: Yes    Comment: occasionally- 2-3 mixed drinks per week.   Drug use: No   Sexual activity: Yes    Birth control/protection: None  Other Topics Concern   Not on file  Social History Narrative   Not on file   Social Determinants of Health   Financial Resource Strain: Not on file  Food Insecurity: Not on file  Transportation Needs: Not on file  Physical Activity: Not on file  Stress: Not on file  Social Connections: Not on file  Intimate Partner Violence: Not on file    Review of Systems: Gen: Denies any fever, chills, cold or flulike symptoms, presyncope, syncope. CV: Denies chest pain or palpitations. Resp: Denies shortness of breath or cough. GI: See HPI MS: Admits to chronic back pain. Derm:  Denies rash Psych: Denies depression or anxiety. Heme: See HPI  Physical Exam: BP (!) 186/68   Pulse 77   Temp (!) 96.9 F (36.1 C) (Temporal)   Ht 5\' 9"  (1.753 m)   Wt 220 lb (99.8 kg)   BMI 32.49 kg/m  General:   Alert and oriented. Pleasant and cooperative. Well-nourished and well-developed.  Head:  Normocephalic and atraumatic. Eyes:  Without icterus, sclera clear and conjunctiva pink.  Ears:  Normal auditory acuity. Lungs:  Clear to auscultation bilaterally. No wheezes, rales, or rhonchi. No distress.  Heart:  S1, S2 present without murmurs appreciated.  Abdomen:  +BS, soft, non-tender and non-distended. No HSM noted. No guarding or rebound. No masses appreciated.  Rectal:  Deferred  Msk:  Symmetrical without gross deformities. Normal posture. Extremities:  Without edema. Neurologic:  Alert and  oriented x4;  grossly normal neurologically. Skin:  Intact without significant lesions or rashes. Psych:  Normal mood and affect.    Assessment: 75 year old male with history of multiple colonic adenomas presenting today to schedule surveillance colonoscopy.  Last colonoscopy in 2019 with multiple tubular adenomas ranging 3 to 9 mm in size with recommendation to repeat in 3 years.  Clinically, he is doing well with no significant upper or lower GI symptoms.  No alarm symptoms.  Family history significant for brother with colon cancer diagnosed at age 58.   Plan: 1.  Proceed with colonoscopy with propofol with Dr. Gala Romney in the near future. The risks, benefits, and alternatives have been discussed in detail with patient. They have stated understanding and desire to proceed.  ASA II Propofol due to Valium as needed, gabapentin, and occasional alcohol. See separate instructions for diabetes medication adjustments.  2.  Follow-up as needed.    Aliene Altes, PA-C [facility] 07/11/2020

## 2020-07-10 ENCOUNTER — Encounter: Payer: Self-pay | Admitting: Family Medicine

## 2020-07-11 ENCOUNTER — Ambulatory Visit: Payer: Medicare Other | Admitting: Gastroenterology

## 2020-07-11 ENCOUNTER — Encounter: Payer: Self-pay | Admitting: Gastroenterology

## 2020-07-11 ENCOUNTER — Other Ambulatory Visit: Payer: Self-pay

## 2020-07-11 VITALS — BP 186/68 | HR 77 | Temp 96.9°F | Ht 69.0 in | Wt 220.0 lb

## 2020-07-11 DIAGNOSIS — Z800 Family history of malignant neoplasm of digestive organs: Secondary | ICD-10-CM | POA: Diagnosis not present

## 2020-07-11 DIAGNOSIS — Z86010 Personal history of colonic polyps: Secondary | ICD-10-CM

## 2020-07-11 NOTE — Patient Instructions (Addendum)
We will arrange to have a colonoscopy in the near future with Dr. Gala Romney. 1 day prior to procedures: Take one half dose of glipizide (5 mg in the morning and evening).  You may take Januvia as prescribed. Day of procedure: Do not take any morning diabetes medications prior to your procedure.  We will follow-up with you in the office as needed.  Do not hesitate to call if you have any new GI concerns.  It was great meeting you today!  Aliene Altes, PA-C [facility]

## 2020-07-13 ENCOUNTER — Other Ambulatory Visit: Payer: Self-pay | Admitting: Family Medicine

## 2020-07-14 ENCOUNTER — Other Ambulatory Visit: Payer: Self-pay | Admitting: Family Medicine

## 2020-07-14 DIAGNOSIS — H8103 Meniere's disease, bilateral: Secondary | ICD-10-CM

## 2020-07-17 ENCOUNTER — Other Ambulatory Visit: Payer: Self-pay | Admitting: Family Medicine

## 2020-07-17 DIAGNOSIS — I10 Essential (primary) hypertension: Secondary | ICD-10-CM

## 2020-08-18 ENCOUNTER — Telehealth: Payer: Self-pay | Admitting: *Deleted

## 2020-08-18 DIAGNOSIS — Z800 Family history of malignant neoplasm of digestive organs: Secondary | ICD-10-CM

## 2020-08-18 DIAGNOSIS — Z86010 Personal history of colonic polyps: Secondary | ICD-10-CM

## 2020-08-18 MED ORDER — PEG 3350-KCL-NA BICARB-NACL 420 G PO SOLR: ORAL | 0 refills | Status: DC

## 2020-08-18 NOTE — Telephone Encounter (Signed)
CALLED PT. Advised to speak with spouse to schedule. He has been scheduled for 9/12 at 8:30am. Aware will mail instructions and send Rx for prep to pharmacy.

## 2020-08-22 ENCOUNTER — Other Ambulatory Visit: Payer: Self-pay | Admitting: Family Medicine

## 2020-08-22 DIAGNOSIS — E119 Type 2 diabetes mellitus without complications: Secondary | ICD-10-CM

## 2020-08-31 ENCOUNTER — Other Ambulatory Visit: Payer: Self-pay | Admitting: Family Medicine

## 2020-08-31 DIAGNOSIS — I10 Essential (primary) hypertension: Secondary | ICD-10-CM

## 2020-08-31 DIAGNOSIS — E119 Type 2 diabetes mellitus without complications: Secondary | ICD-10-CM

## 2020-09-01 NOTE — Progress Notes (Signed)
CARDIOLOGY CONSULT NOTE       Patient ID: CLEOPHUS MENDONSA MRN: [id number] DOB/AGE: [birth year] 77 y.o.  Admit date: (Not on file) Referring Physician: Lovena Le Primary Physician: Erven Colla, DO Primary Cardiologist: Johnsie Cancel Reason for Consultation: HLD  Active Problems:   * No active hospital problems. *   HPI:  75 y.o. referred by dr Lovena Le 03/11/20 for HLD in setting of statin intolerance History of HTN, DM, family history of CAD Father having MI Notes joint/muscle pains with lipitor and zocor and elevated LFTls with zetia . 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 Gala Romney   Allergy    Chronic back pain    DDD (degenerative disc disease), lumbar    Hemorrhoids, external    High cholesterol    HTN (hypertension)    Hyperplastic colon polyp 11/2008   Dr. Gala Romney   Kidney stone    "14 years ago" per pt   Meniere's disease    Tachyarrhythmia    Type 2 diabetes mellitus ([place])     Family History  Problem Relation Age of Onset   Heart attack Father    Colon cancer Brother 59    Social History   Socioeconomic History   Marital status: Married    Spouse name: Not on file   Number of children: Not on file   Years of education: Not on file   Highest education level: Not on file  Occupational History   Not on  file  Tobacco Use   Smoking status: Former   Smokeless tobacco: Former    Quit date: 07/14/1985  Vaping Use   Vaping Use: Never used  Substance and Sexual Activity   Alcohol use: Yes    Comment: occasionally- 2-3 mixed drinks per week.   Drug use: No   Sexual activity: Yes    Birth control/protection: None  Other Topics Concern   Not on file  Social History Narrative   Not on file   Social Determinants of Health   Financial Resource Strain: Not on file  Food Insecurity: Not on file  Transportation Needs: Not on file  Physical Activity: Not on file  Stress: Not on file  Social Connections: Not on file  Intimate Partner Violence: Not on file    Past Surgical History:  Procedure Laterality Date   CATARACT EXTRACTION W/PHACO Left 09/28/2016   Procedure: CATARACT EXTRACTION PHACO AND INTRAOCULAR LENS PLACEMENT ([place]);  Surgeon: Baruch Goldmann, MD;  Location: [facility];  Service: Ophthalmology;  Laterality: Left;  CDE: 4.40   CATARACT EXTRACTION W/PHACO Right 10/26/2016   Procedure: CATARACT EXTRACTION PHACO AND INTRAOCULAR LENS PLACEMENT RIGHT EYE;  Surgeon: Baruch Goldmann, MD;  Location: [facility];  Service: Ophthalmology;  Laterality: Right;  CDE: 6.74   CHOLECYSTECTOMY  COLONOSCOPY  12/10/2008   Dr. Gala Romney- L side diverticula, adenomatous polyp, hyperplastic polyp   COLONOSCOPY  11/16/2003   Dr. Laural Golden- performed exam to cecum,3 polyps- no path report available, external hemorrhoids,    COLONOSCOPY N/A 01/04/2014   Procedure: COLONOSCOPY;  Surgeon: Daneil Dolin, MD;  Location[facility];  Service: Endoscopy;  Laterality: N/A;  8:30am   COLONOSCOPY N/A 02/20/2017   Surgeon: Daneil Dolin, MD; multiple colon polyps ranging 3 to 9 mm in size.  Pathology with tubular adenomas.  Recommended repeat in 3 years.   ESOPHAGOGASTRODUODENOSCOPY  08/31/2011   SPQ:ZRAQTM esophageal erosions consistent with mild erosive reflux esophagitis   MOUTH SURGERY        Current Outpatient  Medications:    acetaminophen (TYLENOL) 325 MG tablet, Take 325 mg by mouth 2 (two) times daily as needed for mild pain or headache. , Disp: , Rfl:    albuterol (VENTOLIN HFA) 108 (90 Base) MCG/ACT inhaler, TAKE 2 PUFFS BY MOUTH EVERY 6 HOURS AS NEEDED FOR WHEEZE OR SHORTNESS OF BREATH, Disp: 18 g, Rfl: 6   Alirocumab (PRALUENT) 150 MG/ML SOAJ, Inject 1 pen into the skin every 14 (fourteen) days., Disp: 6 mL, Rfl: 3   Ascorbic Acid (VITAMIN C) 1000 MG tablet, Take 1,000 mg by mouth 2 (two) times daily. , Disp: , Rfl:    aspirin 81 MG tablet, Take 81 mg by mouth daily., Disp: , Rfl:    celecoxib (CELEBREX) 100 MG capsule, Take by mouth., Disp: , Rfl:    diazepam (VALIUM) 2 MG tablet, TAKE ONE TABLET daily,  prn DIZZINESS, Disp: 30 tablet, Rfl: 1   enalapril (VASOTEC) 20 MG tablet, TAKE 1 TABLET BY MOUTH EVERY DAY, Disp: 90 tablet, Rfl: 0   gabapentin (NEURONTIN) 300 MG capsule, Take 1 capsule by mouth at bedtime., Disp: , Rfl:    glipiZIDE (GLUCOTROL) 5 MG tablet, TAKE 2 TABLETS BY MOUTH TWICE A DAY, Disp: 360 tablet, Rfl: 0   ibuprofen (ADVIL,MOTRIN) 200 MG tablet, Take 200 mg by mouth every 6 (six) hours as needed for headache or mild pain. , Disp: , Rfl:    icosapent Ethyl (VASCEPA) 1 g capsule, Take 2 g by mouth daily., Disp: , Rfl:    JANUVIA 50 MG tablet, TAKE 1 TABLET BY MOUTH EVERY DAY, Disp: 90 tablet, Rfl: 0   Multiple Vitamin (MULTIVITAMIN WITH MINERALS) TABS, Take 1 tablet by mouth daily., Disp: , Rfl:    pantoprazole (PROTONIX) 40 MG tablet, TAKE 1 TABLET BY MOUTH EVERY DAY, Disp: 90 tablet, Rfl: 1   polyethylene glycol-electrolytes (NULYTELY) 420 g solution, As directed, Disp: 4000 mL, Rfl: 0   TIADYLT ER 360 MG 24 hr capsule, TAKE 1 CAPSULE BY MOUTH EVERY DAY, Disp: 90 capsule, Rfl: 1   triamterene-hydrochlorothiazide (MAXZIDE-25) 37.5-25 MG tablet, TAKE 1 TABLET BY MOUTH EVERY DAY, Disp: 30 tablet, Rfl: 2    Physical Exam: Blood pressure (!) 148/62, pulse 68, height 5\' 9"  (1.753  m), weight 100.3 kg, SpO2 97 %.   Affect appropriate Healthy:  appears stated age 50: normal Neck supple with no adenopathy JVP normal bilateral bruits no thyromegaly Lungs clear with no wheezing and good diaphragmatic motion Heart:  S1/S2 SEM murmur, no rub, gallop or click PMI normal Abdomen: benighn, BS positve, no tenderness, no AAA no bruit.  No HSM or HJR Distal pulses intact with no bruits No edema Neuro non-focal Skin warm and dry No muscular weakness   Labs:   Lab Results  Component Value Date   WBC 8.1 01/01/2020   HGB 13.3 01/01/2020   HCT 37.6 01/01/2020   MCV 87 01/01/2020   PLT 293 01/01/2020   No results for input(s): NA, K, CL, CO2, BUN, CREATININE, CALCIUM, PROT, BILITOT, ALKPHOS, ALT, AST, GLUCOSE in the last 168 hours.  Invalid input(s): LABALBU No results found for: CKTOTAL, CKMB, CKMBINDEX, TROPONINI  Lab Results  Component Value Date   CHOL 174 05/06/2020   CHOL 312 (H) 01/01/2020   CHOL 291 (H) 12/23/2018   Lab Results  Component Value Date   HDL 38 (L) 05/06/2020   HDL 39 (L) 01/01/2020   HDL 34 (L) 12/23/2018   Lab Results  Component Value Date   LDLCALC 103 (H) 05/06/2020   LDLCALC 224 (H) 01/01/2020   LDLCALC 201 (H) 12/23/2018   Lab Results  Component Value Date   TRIG 188 (H) 05/06/2020   TRIG 244 (H) 01/01/2020   TRIG 279 (H) 12/23/2018   Lab Results  Component Value Date   CHOLHDL 4.6 05/06/2020   CHOLHDL 8.0 (H) 01/01/2020   CHOLHDL 8.6 (H) 12/23/2018   No results found for: LDLDIRECT    Radiology: No results found.   EKG: 2018 NSR Q lead 3 nonspecific ST changes    ASSESSMENT AND PLAN:   1. HLD:  In setting of DM , low percentile calcium score and only scattered aortic atherosclerosis Markedly improved on Praluent / Vascepa continue   2. HTN:  Continue cardizem, ACE and diuretic Follow Cr 2.1    3. DM:  Discussed low carb diet.  Target hemoglobin A1c is 6.5 or less.  Continue current medications. A1c not  ideally controlled at 7.1   4. Bruit:  he has a benign SEM but louder bruits in neck will order [place] of carotids    Carotid duplex  F/U lipid clinic 3 months  F/u with me in a year   Signed: Jenkins Rouge 09/09/2020, 11:10 AM

## 2020-09-09 ENCOUNTER — Other Ambulatory Visit: Payer: Self-pay

## 2020-09-09 ENCOUNTER — Encounter: Payer: Self-pay | Admitting: Cardiovascular Disease

## 2020-09-09 ENCOUNTER — Ambulatory Visit: Payer: Medicare Other | Admitting: Cardiovascular Disease

## 2020-09-09 VITALS — BP 148/62 | HR 68 | Ht 69.0 in | Wt 221.2 lb

## 2020-09-09 DIAGNOSIS — R0989 Other specified symptoms and signs involving the circulatory and respiratory systems: Secondary | ICD-10-CM | POA: Diagnosis not present

## 2020-09-09 DIAGNOSIS — I2510 Atherosclerotic heart disease of native coronary artery without angina pectoris: Secondary | ICD-10-CM | POA: Diagnosis not present

## 2020-09-09 DIAGNOSIS — E782 Mixed hyperlipidemia: Secondary | ICD-10-CM | POA: Diagnosis not present

## 2020-09-09 DIAGNOSIS — I10 Essential (primary) hypertension: Secondary | ICD-10-CM

## 2020-09-09 NOTE — Patient Instructions (Signed)
Medication Instructions:  Your physician recommends that you continue on your current medications as directed. Please refer to the Current Medication list given to you today.  *If you need a refill on your cardiac medications before your next appointment, please call your pharmacy*   Lab Work: NONE   If you have labs (blood work) drawn today and your tests are completely normal, you will receive your results only by: [place] (if you have MyChart) OR A paper copy in the mail If you have any lab test that is abnormal or we need to change your treatment, we will call you to review the results.   Testing/Procedures: Your physician has requested that you have a carotid duplex. This test is an ultrasound of the carotid arteries in your neck. It looks at blood flow through these arteries that supply the brain with blood. Allow one hour for this exam. There are no restrictions or special instructions.    Follow-Up: At [facility], you and your health needs are our priority.  As part of our continuing mission to provide you with exceptional heart care, we have created designated Provider Care Teams.  These Care Teams include your primary Cardiologist (physician) and Advanced Practice Providers (APPs -  Physician Assistants and Nurse Practitioners) who all work together to provide you with the care you need, when you need it.  We recommend signing up for the patient portal called "MyChart".  Sign up information is provided on this After Visit Summary.  MyChart is used to connect with patients for Virtual Visits (Telemedicine).  Patients are able to view lab/test results, encounter notes, upcoming appointments, etc.  Non-urgent messages can be sent to your provider as well.   To learn more about what you can do with MyChart, go to [email or web address].    Your next appointment:   1 year(s)  The format for your next appointment:   In Person  Provider:   Jenkins Rouge, MD   Other  Instructions Thank you for choosing [facility]!

## 2020-09-19 ENCOUNTER — Other Ambulatory Visit: Payer: Self-pay

## 2020-09-19 ENCOUNTER — Ambulatory Visit (HOSPITAL_COMMUNITY)
Admission: RE | Admit: 2020-09-19 | Discharge: 2020-09-19 | Disposition: A | Discharge status: Home / Self Care | Payer: Medicare Other | Source: Ambulatory Visit | Attending: Cardiovascular Disease | Admitting: Cardiovascular Disease

## 2020-09-19 DIAGNOSIS — R0989 Other specified symptoms and signs involving the circulatory and respiratory systems: Secondary | ICD-10-CM

## 2020-09-20 ENCOUNTER — Ambulatory Visit (INDEPENDENT_AMBULATORY_CARE_PROVIDER_SITE_OTHER): Payer: Medicare Other | Admitting: *Deleted

## 2020-09-20 DIAGNOSIS — Z0000 Encounter for general adult medical examination without abnormal findings: Secondary | ICD-10-CM

## 2020-09-20 NOTE — Patient Instructions (Signed)
Health Maintenance, Male Adopting a healthy lifestyle and getting preventive care are important in promoting health and wellness. Ask your health care provider about: The right schedule for you to have regular tests and exams. Things you can do on your own to prevent diseases and keep yourself healthy. What should I know about diet, weight, and exercise? Eat a healthy diet  Eat a diet that includes plenty of vegetables, fruits, low-fat dairy products, and lean protein. Do not eat a lot of foods that are high in solid fats, added sugars, or sodium.  Maintain a healthy weight Body mass index (BMI) is a measurement that can be used to identify possible weight problems. It estimates body fat based on height and weight. Your health care provider can help determine your BMI and help you achieve or maintain ahealthy weight. Get regular exercise Get regular exercise. This is one of the most important things you can do for your health. Most adults should: Exercise for at least 150 minutes each week. The exercise should increase your heart rate and make you sweat (moderate-intensity exercise). Do strengthening exercises at least twice a week. This is in addition to the moderate-intensity exercise. Spend less time sitting. Even light physical activity can be beneficial. Watch cholesterol and blood lipids Have your blood tested for lipids and cholesterol at 77 years of age, then havethis test every 5 years. You may need to have your cholesterol levels checked more often if: Your lipid or cholesterol levels are high. You are older than 77 years of age. You are at high risk for heart disease. What should I know about cancer screening? Many types of cancers can be detected early and may often be prevented. Depending on your health history and family history, you may need to have cancer screening at various ages. This may include screening for: Colorectal cancer. Prostate cancer. Skin cancer. Lung  cancer. What should I know about heart disease, diabetes, and high blood pressure? Blood pressure and heart disease High blood pressure causes heart disease and increases the risk of stroke. This is more likely to develop in people who have high blood pressure readings, are of African descent, or are overweight. Talk with your health care provider about your target blood pressure readings. Have your blood pressure checked: Every 3-5 years if you are 18-39 years of age. Every year if you are 40 years old or older. If you are between the ages of 65 and 75 and are a current or former smoker, ask your health care provider if you should have a one-time screening for abdominal aortic aneurysm (AAA). Diabetes Have regular diabetes screenings. This checks your fasting blood sugar level. Have the screening done: Once every three years after age 45 if you are at a normal weight and have a low risk for diabetes. More often and at a younger age if you are overweight or have a high risk for diabetes. What should I know about preventing infection? Hepatitis B If you have a higher risk for hepatitis B, you should be screened for this virus. Talk with your health care provider to find out if you are at risk forhepatitis B infection. Hepatitis C Blood testing is recommended for: Everyone born from 1945 through 1965. Anyone with known risk factors for hepatitis C. Sexually transmitted infections (STIs) You should be screened each year for STIs, including gonorrhea and chlamydia, if: You are sexually active and are younger than 77 years of age. You are older than 77 years of age   and your health care provider tells you that you are at risk for this type of infection. Your sexual activity has changed since you were last screened, and you are at increased risk for chlamydia or gonorrhea. Ask your health care provider if you are at risk. Ask your health care provider about whether you are at high risk for HIV.  Your health care provider may recommend a prescription medicine to help prevent HIV infection. If you choose to take medicine to prevent HIV, you should first get tested for HIV. You should then be tested every 3 months for as long as you are taking the medicine. Follow these instructions at home: Lifestyle Do not use any products that contain nicotine or tobacco, such as cigarettes, e-cigarettes, and chewing tobacco. If you need help quitting, ask your health care provider. Do not use street drugs. Do not share needles. Ask your health care provider for help if you need support or information about quitting drugs. Alcohol use Do not drink alcohol if your health care provider tells you not to drink. If you drink alcohol: Limit how much you have to 0-2 drinks a day. Be aware of how much alcohol is in your drink. In the [place] one drink equals one 12 oz bottle of beer (355 mL), one 5 oz glass of wine (148 mL), or one 1 oz glass of hard liquor (44 mL). General instructions Schedule regular health, dental, and eye exams. Stay current with your vaccines. Tell your health care provider if: You often feel depressed. You have ever been abused or do not feel safe at home. Summary Adopting a healthy lifestyle and getting preventive care are important in promoting health and wellness. Follow your health care provider's instructions about healthy diet, exercising, and getting tested or screened for diseases. Follow your health care provider's instructions on monitoring your cholesterol and blood pressure. This information is not intended to replace advice given to you by your health care provider. Make sure you discuss any questions you have with your healthcare provider. Document Revised: 01/01/2018 Document Reviewed: 01/01/2018 Elsevier Patient Education  2022 Elsevier Inc.  

## 2020-09-20 NOTE — Progress Notes (Addendum)
Subjective:   Kyle Dunlap is a 75 y.o. male who presents for Medicare Annual/Subsequent preventive examination.  Review of Systems   n/a  I connected with  PEDRO WHITERS on 09/21/20 by a telemedicine application and verified that I am speaking with the correct person using two identifiers.   I discussed the limitations of evaluation and management by telemedicine. The patient expressed understanding and agreed to proceed.  Location of patient: Home Location of Provider: Telephone Persons participating in Virtual visit; Phillp (patient) Lavonna Lampron (RMA)  Cardiac Risk Factors include: male gender;diabetes mellitus     Objective:    There were no vitals filed for this visit. There is no height or weight on file to calculate BMI.  Advanced Directives 09/20/2020 02/20/2017 10/26/2016 09/28/2016 09/25/2016 01/04/2014 08/31/2011  Does Patient Have a Medical Advance Directive? Yes Yes No Yes Yes Yes Patient has advance directive, copy not in chart  Type of Advance Directive [place];Living will [place];Living will Living will;Healthcare Power of [place] in Chart? Yes - validated most recent copy scanned in chart (See row information) - - No - copy requested No - copy requested No - copy requested -  Pre-existing out of facility DNR order (yellow form or pink MOST form) - - - - - - No    Current Medications (verified) Outpatient Encounter Medications as of 09/20/2020  Medication Sig   acetaminophen (TYLENOL) 325 MG tablet Take 325 mg by mouth 2 (two) times daily as needed for mild pain or headache.    albuterol (VENTOLIN HFA) 108 (90 Base) MCG/ACT inhaler TAKE 2 PUFFS BY MOUTH EVERY 6 HOURS AS NEEDED FOR WHEEZE OR SHORTNESS OF BREATH   Alirocumab (PRALUENT) 150 MG/ML SOAJ Inject 1 pen into the skin every 14 (fourteen) days.   Ascorbic Acid (VITAMIN C) 1000  MG tablet Take 1,000 mg by mouth 2 (two) times daily.    aspirin 81 MG tablet Take 81 mg by mouth daily.   celecoxib (CELEBREX) 100 MG capsule Take by mouth.   diazepam (VALIUM) 2 MG tablet TAKE ONE TABLET daily,  prn DIZZINESS   enalapril (VASOTEC) 20 MG tablet TAKE 1 TABLET BY MOUTH EVERY DAY   gabapentin (NEURONTIN) 300 MG capsule Take 1 capsule by mouth at bedtime.   glipiZIDE (GLUCOTROL) 5 MG tablet TAKE 2 TABLETS BY MOUTH TWICE A DAY   ibuprofen (ADVIL,MOTRIN) 200 MG tablet Take 200 mg by mouth every 6 (six) hours as needed for headache or mild pain.    icosapent Ethyl (VASCEPA) 1 g capsule Take 2 g by mouth daily.   JANUVIA 50 MG tablet TAKE 1 TABLET BY MOUTH EVERY DAY   Multiple Vitamin (MULTIVITAMIN WITH MINERALS) TABS Take 1 tablet by mouth daily.   pantoprazole (PROTONIX) 40 MG tablet TAKE 1 TABLET BY MOUTH EVERY DAY   polyethylene glycol-electrolytes (NULYTELY) 420 g solution As directed   TIADYLT ER 360 MG 24 hr capsule TAKE 1 CAPSULE BY MOUTH EVERY DAY   triamterene-hydrochlorothiazide (MAXZIDE-25) 37.5-25 MG tablet TAKE 1 TABLET BY MOUTH EVERY DAY   No facility-administered encounter medications on file as of 09/20/2020.    Allergies (verified) Atorvastatin, Zetia [ezetimibe], Zocor [simvastatin], and Codeine   History: Past Medical History:  Diagnosis Date   Adenomatous colon polyp 11/2008   Dr. Gala Romney   Allergy    Chronic back pain    DDD (degenerative disc  disease), lumbar    Hemorrhoids, external    High cholesterol    HTN (hypertension)    Hyperplastic colon polyp 11/2008   Dr. Gala Romney   Kidney stone    "14 years ago" per pt   Meniere's disease    Tachyarrhythmia    Type 2 diabetes mellitus [facility])    Past Surgical History:  Procedure Laterality Date   CATARACT EXTRACTION W/PHACO Left 09/28/2016   Procedure: CATARACT EXTRACTION PHACO AND INTRAOCULAR LENS PLACEMENT (IOC);  Surgeon: Baruch Goldmann, MD;  Location: [facility];  Service: Ophthalmology;  Laterality:  Left;  CDE: 4.40   CATARACT EXTRACTION W/PHACO Right 10/26/2016   Procedure: CATARACT EXTRACTION PHACO AND INTRAOCULAR LENS PLACEMENT RIGHT EYE;  Surgeon: Baruch Goldmann, MD;  Location: [facility];  Service: Ophthalmology;  Laterality: Right;  CDE: 6.74   CHOLECYSTECTOMY     COLONOSCOPY  12/10/2008   Dr. Gala Romney- L side diverticula, adenomatous polyp, hyperplastic polyp   COLONOSCOPY  11/16/2003   Dr. Laural Golden- performed exam to cecum,3 polyps- no path report available, external hemorrhoids,    COLONOSCOPY N/A 01/04/2014   Procedure: COLONOSCOPY;  Surgeon: Daneil Dolin, MD;  Location: [facility] SUITE;  Service: Endoscopy;  Laterality: N/A;  8:30am   COLONOSCOPY N/A 02/20/2017   Surgeon: Daneil Dolin, MD; multiple colon polyps ranging 3 to 9 mm in size.  Pathology with tubular adenomas.  Recommended repeat in 3 years.   ESOPHAGOGASTRODUODENOSCOPY  08/31/2011   IOX:BDZHGD esophageal erosions consistent with mild erosive reflux esophagitis   MOUTH SURGERY     Family History  Problem Relation Age of Onset   Heart attack Father    Colon cancer Brother 3   Social History   Socioeconomic History   Marital status: Married    Spouse name: Not on file   Number of children: Not on file   Years of education: Not on file   Highest education level: Not on file  Occupational History   Not on file  Tobacco Use   Smoking status: Former   Smokeless tobacco: Former    Quit date: 07/14/1985  Vaping Use   Vaping Use: Never used  Substance and Sexual Activity   Alcohol use: Yes    Comment: occasionally- 2-3 mixed drinks per week.   Drug use: No   Sexual activity: Yes    Birth control/protection: None  Other Topics Concern   Not on file  Social History Narrative   Not on file   Social Determinants of Health   Financial Resource Strain: Low Risk    Difficulty of Paying Living Expenses: Not hard at all  Food Insecurity: No Food Insecurity   Worried About Charity fundraiser in the Last Year:  Never true   [place] in the Last Year: Never true  Transportation Needs: No Transportation Needs   Lack of Transportation (Medical): No   Lack of Transportation (Non-Medical): No  Physical Activity: Insufficiently Active   Days of Exercise per Week: 1 day   Minutes of Exercise per Session: 10 min  Stress: No Stress Concern Present   Feeling of Stress : Not at all  Social Connections: Socially Integrated   Frequency of Communication with Friends and Family: Twice a week   Frequency of Social Gatherings with Friends and Family: Once a week   Attends Religious Services: More than 4 times per year   Active Member of [employer] or Organizations: Yes   Attends Archivist Meetings: 1 to 4 times per  year   Marital Status: Married    Tobacco Counseling Counseling given: Not Answered   Clinical Intake:  Pre-visit preparation completed: Yes  Pain : No/denies pain           Diabetic? YES         Activities of Daily Living In your present state of health, do you have any difficulty performing the following activities: 09/20/2020  Hearing? N  Vision? Y  Difficulty concentrating or making decisions? N  Walking or climbing stairs? N  Dressing or bathing? N  Doing errands, shopping? N  Preparing Food and eating ? N  Using the Toilet? N  In the past six months, have you accidently leaked urine? N  Do you have problems with loss of bowel control? N  Managing your Medications? N  Managing your Finances? N  Housekeeping or managing your Housekeeping? N  Some recent data might be hidden    Patient Care Team: Erven Colla, DO as PCP - General (Family Medicine) Josue Hector, MD as PCP - Cardiology (Cardiology) Gala Romney Cristopher Estimable, MD (Gastroenterology)  Indicate any recent Medical Services you may have received from other than [facility] providers in the past year (date may be approximate).     Assessment:   This is a routine wellness examination for  [place].  Hearing/Vision screen No results found.  Dietary issues and exercise activities discussed: Current Exercise Habits: Home exercise routine, Exercise limited by: None identified   Goals Addressed   None    Depression Screen PHQ 2/9 Scores 09/20/2020 01/01/2020 12/23/2018 03/10/2018 12/10/2017 09/11/2016  PHQ - 2 Score 1 0 0 0 0 0  PHQ- 9 Score 3 - - - - -    Fall Risk Fall Risk  09/20/2020 06/29/2020 01/01/2020 12/23/2018 03/10/2018  Falls in the past year? 0 0 0 0 0  Number falls in past yr: 0 0 - - 0  Injury with Fall? 0 0 - - -  Risk for fall due to : No Fall Risks No Fall Risks - - -  Follow up Falls evaluation completed Falls evaluation completed Falls evaluation completed - Falls evaluation completed    FALL RISK PREVENTION PERTAINING TO THE HOME:  Any stairs in or around the home? No  If so, are there any without handrails? N/a Home free of loose throw rugs in walkways,pet beds, electrical cords, etc? No  Adequate lighting in your home to reduce risk of falls? Yes   ASSISTIVE DEVICES UTILIZED TO PREVENT FALLS:  Life alert? No  Use of a cane, walker or w/c? No  Grab bars in the bathroom? No  Shower chair or bench in shower? No  Elevated toilet seat or a handicapped toilet? No   TIMED UP AND GO:  Was the test performed?  n/a .  Length of time to ambulate 10 feet: n/a sec.   Gait steady and fast without use of assistive device  Cognitive Function:     6CIT Screen 09/20/2020  What Year? 0 points  What month? 0 points  What time? 0 points  Count back from 20 0 points  Months in reverse 0 points  Repeat phrase 0 points  Total Score 0    Immunizations Immunization History  Administered Date(s) Administered   H1N1 01/01/2008   Influenza,inj,Quad PF,6+ Mos 10/22/2014, 10/25/2015, 11/07/2016, 10/23/2017, 10/24/2018, 10/05/2019   Influenza-Unspecified 10/23/2010, 10/28/2012, 10/22/2013, 10/28/2019   Moderna Sars-Covid-2 Vaccination 03/09/2019, 04/06/2019,  11/17/2019   PFIZER(Purple Top)SARS-COV-2 Vaccination 02/17/2019, 03/10/2019   Pneumococcal Conjugate-13  03/15/2014   Pneumococcal Polysaccharide-23 09/22/2009   Td 07/17/2007   Zoster Recombinat (Shingrix) 03/10/2020   Zoster, Live 03/05/2013    TDAP status: Up to date  Flu Vaccine status: Up to date  Pneumococcal vaccine status: Up to date aged out  Covid-19 vaccine status: Completed vaccines   Qualifies for Shingles Vaccine? Yes   Zostavax completed Yes   Shingrix Completed?: Yes  Screening Tests Health Maintenance  Topic Date Due   TETANUS/TDAP  07/16/2017   FOOT EXAM  06/12/2018   Zoster Vaccines- Shingrix (2 of 2) 05/05/2020   INFLUENZA VACCINE  08/22/2020   HEMOGLOBIN A1C  12/29/2020   OPHTHALMOLOGY EXAM  06/08/2021   COVID-19 Vaccine  Completed   Hepatitis C Screening  Completed   PNA vac Low Risk Adult  Completed   HPV VACCINES  Aged Out    Health Maintenance  Health Maintenance Due  Topic Date Due   TETANUS/TDAP  07/16/2017   FOOT EXAM  06/12/2018   Zoster Vaccines- Shingrix (2 of 2) 05/05/2020   INFLUENZA VACCINE  08/22/2020    Colorectal cancer screening: Type of screening: Colonoscopy. Completed September 12th. Repeat every 3 years  Lung Cancer Screening: (Low Dose CT Chest recommended if Age 20-80 years, 30 pack-year currently smoking OR have quit w/in 15years.) does not qualify.   Lung Cancer Screening Referral: n/a   Additional Screening:  Hepatitis C Screening: does qualify completed 03/15/2015  Vision Screening: Recommended annual ophthalmology exams for early detection of glaucoma and other disorders of the eye. Is the patient up to date with their annual eye exam?  Yes  Who is the provider or what is the name of the office in which the patient attends annual eye exams?  [facility] center If pt is not established with a provider, would they like to be referred to a provider to establish care?  N/a .   Dental Screening: Recommended  annual dental exams for proper oral hygiene  Community Resource Referral / Chronic Care Management: CRR required this visit?  No   CCM required this visit?  No      Plan:     I have personally reviewed and noted the following in the patient's chart:   Medical and social history Use of alcohol, tobacco or illicit drugs  Current medications and supplements including opioid prescriptions. Patient is not currently taking opioid prescriptions. Functional ability and status Nutritional status Physical activity Advanced directives List of other physicians Hospitalizations, surgeries, and ER visits in previous 12 months Vitals Screenings to include cognitive, depression, and falls Referrals and appointments  In addition, I have reviewed and discussed with patient certain preventive protocols, quality metrics, and best practice recommendations. A written personalized care plan for preventive services as well as general preventive health recommendations were provided to patient.     Kassia Demarinis [place], [place]   09/21/2020   Nurse Notes: non face to face 1hr visit  Mr. Goodrich , Thank you for taking time to come for your Medicare Wellness Visit. I appreciate your ongoing commitment to your health goals. Please review the following plan we discussed and let me know if I can assist you in the future.   These are the goals we discussed:  Goals   None     This is a list of the screening recommended for you and due dates:  Health Maintenance  Topic Date Due   Tetanus Vaccine  07/16/2017   Complete foot exam   06/12/2018   Zoster (Shingles) Vaccine (  2 of 2) 05/05/2020   Flu Shot  08/22/2020   Hemoglobin A1C  12/29/2020   Eye exam for diabetics  06/08/2021   COVID-19 Vaccine  Completed   Hepatitis C Screening: USPSTF Recommendation to screen - Ages 18-79 yo.  Completed   Pneumonia vaccines  Completed   HPV Vaccine  Aged Out

## 2020-09-23 ENCOUNTER — Other Ambulatory Visit: Payer: Self-pay | Admitting: Family Medicine

## 2020-09-23 DIAGNOSIS — H8103 Meniere's disease, bilateral: Secondary | ICD-10-CM

## 2020-09-27 NOTE — Telephone Encounter (Signed)
Pt needs to f/u with new provider for these refills for his meniere's disease.  Thx. Dr. Lovena Le

## 2020-09-28 ENCOUNTER — Telehealth: Payer: Self-pay

## 2020-09-28 NOTE — Telephone Encounter (Signed)
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

## 2020-09-28 NOTE — Telephone Encounter (Signed)
Pt notified and voiced understanding. Pt had no questions or concerns at this time. 

## 2020-09-30 ENCOUNTER — Other Ambulatory Visit: Payer: Self-pay

## 2020-09-30 ENCOUNTER — Other Ambulatory Visit (HOSPITAL_COMMUNITY)
Admission: RE | Admit: 2020-09-30 | Discharge: 2020-09-30 | Disposition: A | Discharge status: Home / Self Care | Payer: Medicare Other | Source: Ambulatory Visit | Attending: Internal Medicine | Admitting: Internal Medicine

## 2020-09-30 DIAGNOSIS — Z800 Family history of malignant neoplasm of digestive organs: Secondary | ICD-10-CM

## 2020-09-30 DIAGNOSIS — Z86010 Personal history of colonic polyps: Secondary | ICD-10-CM | POA: Insufficient documentation

## 2020-09-30 LAB — BASIC METABOLIC PANEL
Anion gap: 7 (ref 5–15)
BUN: 34 mg/dL — ABNORMAL HIGH (ref 8–23)
CO2: 23 mmol/L (ref 22–32)
Calcium: 8.9 mg/dL (ref 8.9–10.3)
Chloride: 103 mmol/L (ref 98–111)
Creatinine, Ser: 1.85 mg/dL — ABNORMAL HIGH (ref 0.61–1.24)
GFR, Estimated: 37 mL/min — ABNORMAL LOW (ref >60)
Glucose, Bld: 153 mg/dL — ABNORMAL HIGH (ref 70–99)
Potassium: 5 mmol/L (ref 3.5–5.1)
Sodium: 133 mmol/L — ABNORMAL LOW (ref 135–145)

## 2020-10-03 ENCOUNTER — Ambulatory Visit (HOSPITAL_COMMUNITY): Payer: Medicare Other | Admitting: Anesthesiology

## 2020-10-03 ENCOUNTER — Ambulatory Visit (HOSPITAL_COMMUNITY)
Admission: RE | Admit: 2020-10-03 | Discharge: 2020-10-03 | Disposition: A | Discharge status: Home / Self Care | Payer: Medicare Other | Attending: Internal Medicine | Admitting: Internal Medicine

## 2020-10-03 ENCOUNTER — Ambulatory Visit: Payer: Medicare Other | Admitting: Internal Medicine

## 2020-10-03 ENCOUNTER — Other Ambulatory Visit: Payer: Self-pay

## 2020-10-03 ENCOUNTER — Encounter (HOSPITAL_COMMUNITY): Payer: Self-pay | Admitting: Internal Medicine

## 2020-10-03 ENCOUNTER — Encounter (HOSPITAL_COMMUNITY)
Admission: RE | Disposition: A | Discharge status: Home / Self Care | Payer: Self-pay | Source: Home / Self Care | Attending: Internal Medicine

## 2020-10-03 DIAGNOSIS — H8109 Meniere's disease, unspecified ear: Secondary | ICD-10-CM | POA: Insufficient documentation

## 2020-10-03 DIAGNOSIS — I10 Essential (primary) hypertension: Secondary | ICD-10-CM | POA: Diagnosis not present

## 2020-10-03 DIAGNOSIS — Z9049 Acquired absence of other specified parts of digestive tract: Secondary | ICD-10-CM | POA: Insufficient documentation

## 2020-10-03 DIAGNOSIS — Z1211 Encounter for screening for malignant neoplasm of colon: Secondary | ICD-10-CM | POA: Diagnosis not present

## 2020-10-03 DIAGNOSIS — Z87891 Personal history of nicotine dependence: Secondary | ICD-10-CM | POA: Insufficient documentation

## 2020-10-03 DIAGNOSIS — Z888 Allergy status to other drugs, medicaments and biological substances status: Secondary | ICD-10-CM | POA: Insufficient documentation

## 2020-10-03 DIAGNOSIS — Z86010 Personal history of colonic polyps: Secondary | ICD-10-CM | POA: Diagnosis not present

## 2020-10-03 DIAGNOSIS — Z8249 Family history of ischemic heart disease and other diseases of the circulatory system: Secondary | ICD-10-CM | POA: Insufficient documentation

## 2020-10-03 DIAGNOSIS — Z7982 Long term (current) use of aspirin: Secondary | ICD-10-CM | POA: Diagnosis not present

## 2020-10-03 DIAGNOSIS — D126 Benign neoplasm of colon, unspecified: Secondary | ICD-10-CM | POA: Diagnosis not present

## 2020-10-03 DIAGNOSIS — D123 Benign neoplasm of transverse colon: Secondary | ICD-10-CM | POA: Diagnosis not present

## 2020-10-03 DIAGNOSIS — Z7984 Long term (current) use of oral hypoglycemic drugs: Secondary | ICD-10-CM | POA: Insufficient documentation

## 2020-10-03 DIAGNOSIS — E119 Type 2 diabetes mellitus without complications: Secondary | ICD-10-CM | POA: Diagnosis not present

## 2020-10-03 DIAGNOSIS — Z791 Long term (current) use of non-steroidal anti-inflammatories (NSAID): Secondary | ICD-10-CM | POA: Diagnosis not present

## 2020-10-03 DIAGNOSIS — E7800 Pure hypercholesterolemia, unspecified: Secondary | ICD-10-CM | POA: Diagnosis not present

## 2020-10-03 DIAGNOSIS — Z79899 Other long term (current) drug therapy: Secondary | ICD-10-CM | POA: Diagnosis not present

## 2020-10-03 DIAGNOSIS — K5730 Diverticulosis of large intestine without perforation or abscess without bleeding: Secondary | ICD-10-CM | POA: Insufficient documentation

## 2020-10-03 DIAGNOSIS — Z800 Family history of malignant neoplasm of digestive organs: Secondary | ICD-10-CM | POA: Diagnosis not present

## 2020-10-03 DIAGNOSIS — Z885 Allergy status to narcotic agent status: Secondary | ICD-10-CM | POA: Diagnosis not present

## 2020-10-03 HISTORY — PX: POLYPECTOMY: SHX5525

## 2020-10-03 HISTORY — PX: COLONOSCOPY WITH PROPOFOL: SHX5780

## 2020-10-03 HISTORY — DX: Post-traumatic stress disorder, unspecified: F43.10

## 2020-10-03 LAB — GLUCOSE, CAPILLARY: Glucose-Capillary: 127 mg/dL — ABNORMAL HIGH (ref 70–99)

## 2020-10-03 SURGERY — COLONOSCOPY WITH PROPOFOL
Anesthesia: General

## 2020-10-03 MED ORDER — PROPOFOL 500 MG/50ML IV EMUL
INTRAVENOUS | Status: DC | PRN
  Administered 2020-10-03: 150 ug/kg/min via INTRAVENOUS

## 2020-10-03 MED ORDER — PROPOFOL 10 MG/ML IV BOLUS
INTRAVENOUS | Status: DC | PRN
  Administered 2020-10-03: 40 mg via INTRAVENOUS

## 2020-10-03 MED ORDER — STERILE WATER FOR IRRIGATION IR SOLN
Status: DC | PRN
  Administered 2020-10-03: 100 mL

## 2020-10-03 MED ORDER — LACTATED RINGERS IV SOLN: INTRAVENOUS | Status: DC | PRN

## 2020-10-03 MED ORDER — LACTATED RINGERS IV SOLN: INTRAVENOUS | Status: DC

## 2020-10-03 MED ORDER — LIDOCAINE HCL (CARDIAC) PF 100 MG/5ML IV SOSY
PREFILLED_SYRINGE | INTRAVENOUS | Status: DC | PRN
  Administered 2020-10-03: 50 mg via INTRATRACHEAL

## 2020-10-03 NOTE — Op Note (Signed)
[facility] Patient Name: Kyle Dunlap Procedure Date: 10/03/2020 8:36 AM MRN: [id number] Date of Birth: January 19, 1946 Attending MD: Norvel Richards , MD CSN: [id number] Age: 75 Admit Type: Outpatient Procedure:                Colonoscopy Indications:              High risk colon cancer surveillance: Personal                            history of colonic polyps Providers:                Norvel Richards, MD, Tammy Vaught, RN,                            Kristine L. Risa Grill, Technician Referring MD:              Medicines:                Propofol per Anesthesia Complications:            No immediate complications. Estimated Blood Loss:     Estimated blood loss was minimal. Procedure:                Pre-Anesthesia Assessment:                           - Prior to the procedure, a History and Physical                            was performed, and patient medications and                            allergies were reviewed. The patient's tolerance of                            previous anesthesia was also reviewed. The risks                            and benefits of the procedure and the sedation                            options and risks were discussed with the patient.                            All questions were answered, and informed consent                            was obtained. Prior Anticoagulants: The patient has                            taken no previous anticoagulant or antiplatelet                            agents. ASA Grade Assessment: II - A patient with  mild systemic disease. After reviewing the risks                            and benefits, the patient was deemed in                            satisfactory condition to undergo the procedure.                           After obtaining informed consent, the colonoscope                            was passed under direct vision. Throughout the                            procedure,  the patient's blood pressure, pulse, and                            oxygen saturations were monitored continuously. The                            [id number]) scope was introduced through the                            anus and advanced to the the cecum, identified by                            appendiceal orifice and ileocecal valve. The                            colonoscopy was performed without difficulty. The                            patient tolerated the procedure well. The quality                            of the bowel preparation was adequate. Scope In: 9:00:39 AM Scope Out: 9:30:04 AM Scope Withdrawal Time: 0 hours 13 minutes 50 seconds  Total Procedure Duration: 0 hours 29 minutes 25 seconds  Findings:      The perianal and digital rectal examinations were normal.      Scattered medium-mouthed diverticula were found in the sigmoid colon and       descending colon.      Nine semi-pedunculated polyps were found in the splenic flexure and       hepatic flexure. The polyps were 3 to 7 mm in size. These polyps were       removed with a cold snare. Resection and retrieval were complete.       Estimated blood loss was minimal.      The exam was otherwise without abnormality on direct and retroflexion       views. Impression:               - Diverticulosis in the sigmoid colon and in the  descending colon.                           - Nine 3 to 7 mm polyps at the splenic flexure and                            at the hepatic flexure, removed with a cold snare.                            Resected and retrieved.                           - The examination was otherwise normal on direct                            and retroflexion views. Moderate Sedation:      Moderate (conscious) sedation was personally administered by an       anesthesia professional. The following parameters were monitored: oxygen       saturation, heart rate, blood pressure,  respiratory rate, EKG, adequacy       of pulmonary ventilation, and response to care. Recommendation:           - Patient has a contact number available for                            emergencies. The signs and symptoms of potential                            delayed complications were discussed with the                            patient. Return to normal activities tomorrow.                            Written discharge instructions were provided to the                            patient.                           - Resume previous diet.                           - Continue present medications.                           - Repeat colonoscopy date to be determined after                            pending pathology results are reviewed for                            surveillance.                           - Return to GI office (date not yet determined). Procedure  Code(s):        --- Professional ---                           [phone], Colonoscopy, flexible; with removal of                            tumor(s), polyp(s), or other lesion(s) by snare                            technique Diagnosis Code(s):        --- Professional ---                           Z12.11, Encounter for screening for malignant                            neoplasm of colon                           Z86.010, Personal history of colonic polyps                           K63.5, Polyp of colon                           K57.30, Diverticulosis of large intestine without                            perforation or abscess without bleeding CPT copyright 2019 American Medical Association. All rights reserved. The codes documented in this report are preliminary and upon coder review may  be revised to meet current compliance requirements. Cristopher Estimable. Nashla Althoff, MD Norvel Richards, MD 10/03/2020 9:40:10 AM This report has been signed electronically. Number of Addenda: 0

## 2020-10-03 NOTE — Anesthesia Postprocedure Evaluation (Signed)
Anesthesia Post Note  Patient: Kyle Dunlap  Procedure(s) Performed: COLONOSCOPY WITH PROPOFOL POLYPECTOMY  Patient location during evaluation: Phase II Anesthesia Type: General Level of consciousness: awake Pain management: pain level controlled Vital Signs Assessment: post-procedure vital signs reviewed and stable Respiratory status: spontaneous breathing and respiratory function stable Cardiovascular status: blood pressure returned to baseline and stable Postop Assessment: no headache and no apparent nausea or vomiting Anesthetic complications: no Comments: Late entry   No notable events documented.   Last Vitals:  Vitals:   10/03/20 0725 10/03/20 0935  BP: (!) 198/73 (!) 135/47  Pulse:  73  Resp: 13 15  Temp: 36.8 C 36.6 C  SpO2: 100% 98%    Last Pain:  Vitals:   10/03/20 0935  TempSrc: Oral  PainSc: 0-No pain                 Louann Sjogren

## 2020-10-03 NOTE — H&P (Signed)
@LOGO @   Primary Care Physician:  Erven Colla, DO Primary Gastroenterologist:  Dr. Gala Romney  Pre-Procedure History & Physical: HPI:  Kyle Dunlap is a 75 y.o. male here for surveillance colonoscopy.  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 No bowel symptoms. Past Medical History:  Diagnosis Date   Adenomatous colon polyp 11/22/2008   Dr. Gala Romney   Allergy    Chronic back pain    DDD (degenerative disc disease), lumbar    Hemorrhoids, external    High cholesterol    HTN (hypertension)    Hyperplastic colon polyp 11/22/2008   Dr. Gala Romney   Kidney stone    "14 years ago" per pt   Meniere's disease    PTSD (post-traumatic stress disorder)    Tachyarrhythmia    Type 2 diabetes mellitus ([place])     Past Surgical History:  Procedure Laterality Date   CATARACT EXTRACTION W/PHACO Left 09/28/2016   Procedure: CATARACT EXTRACTION PHACO AND INTRAOCULAR LENS PLACEMENT (IOC);  Surgeon: Baruch Goldmann, MD;  Location: [facility];  Service: Ophthalmology;  Laterality: Left;  CDE: 4.40   CATARACT EXTRACTION W/PHACO Right 10/26/2016   Procedure: CATARACT EXTRACTION PHACO AND INTRAOCULAR LENS PLACEMENT RIGHT EYE;  Surgeon: Baruch Goldmann, MD;  Location: [facility];  Service: Ophthalmology;  Laterality: Right;  CDE: 6.74   CHOLECYSTECTOMY     COLONOSCOPY  12/10/2008   Dr. Gala Romney- L side diverticula, adenomatous polyp, hyperplastic polyp   COLONOSCOPY  11/16/2003   Dr. Laural Golden- performed exam to cecum,3 polyps- no path report available, external hemorrhoids,    COLONOSCOPY N/A 01/04/2014   Procedure: COLONOSCOPY;  Surgeon: Daneil Dolin, MD;  Location: [facility];  Service: Endoscopy;  Laterality: N/A;  8:30am   COLONOSCOPY N/A 02/20/2017   Surgeon: Daneil Dolin, MD; multiple colon polyps ranging 3 to 9 mm in size.  Pathology with tubular adenomas.  Recommended repeat in 3 years.   ESOPHAGOGASTRODUODENOSCOPY  08/31/2011   XQJ:JHERDE esophageal  erosions consistent with mild erosive reflux esophagitis   MOUTH SURGERY      Prior to Admission medications   Medication Sig Start Date End Date Taking? Authorizing Provider  acetaminophen (TYLENOL) 325 MG tablet Take 325 mg by mouth every 6 (six) hours as needed for moderate pain.   Yes [provider]  Alirocumab (PRALUENT) 150 MG/ML SOAJ Inject 1 pen into the skin every 14 (fourteen) days. 03/02/20  Yes Josue Hector, MD  Ascorbic Acid (VITAMIN C) 1000 MG tablet Take 1,000 mg by mouth daily.   Yes [provider]  aspirin 81 MG tablet Take 81 mg by mouth daily.   Yes [provider]  diazepam (VALIUM) 2 MG tablet TAKE ONE TABLET BY MOUTH EVERY NIGHT AT BEDTIME FOR DIZZINESS Patient taking differently: Take 2 mg by mouth at bedtime. 09/27/20  Yes Taylor, Malena M, DO  enalapril (VASOTEC) 20 MG tablet TAKE 1 TABLET BY MOUTH EVERY DAY 09/01/20  Yes Lovena Le, Malena M, DO  gabapentin (NEURONTIN) 300 MG capsule Take 1 capsule by mouth at bedtime. 06/13/20  Yes [provider]  glipiZIDE (GLUCOTROL) 5 MG tablet TAKE 2 TABLETS BY MOUTH TWICE A DAY 09/01/20  Yes Lovena Le, Malena M, DO  ibuprofen (ADVIL,MOTRIN) 200 MG tablet Take 200 mg by mouth at bedtime.   Yes [provider]  JANUVIA 50 MG tablet TAKE 1 TABLET BY MOUTH EVERY DAY 08/22/20  Yes Lovena Le, Malena M, DO  Multiple Vitamin (MULTIVITAMIN WITH MINERALS) TABS Take  1 tablet by mouth daily.   Yes [provider]  pantoprazole (PROTONIX) 40 MG tablet TAKE 1 TABLET BY MOUTH EVERY DAY 07/04/20  Yes Lovena Le, Malena M, DO  sodium chloride (OCEAN) 0.65 % SOLN nasal spray Place 1 spray into both nostrils at bedtime.   Yes [provider]  TIADYLT ER 360 MG 24 hr capsule TAKE 1 CAPSULE BY MOUTH EVERY DAY 07/14/20  Yes Lovena Le, Malena M, DO  triamterene-hydrochlorothiazide (MAXZIDE-25) 37.5-25 MG tablet TAKE 1 TABLET BY MOUTH EVERY DAY 07/18/20  Yes Lovena Le, Malena M, DO  albuterol (VENTOLIN HFA) 108 (90  Base) MCG/ACT inhaler TAKE 2 PUFFS BY MOUTH EVERY 6 HOURS AS NEEDED FOR WHEEZE OR SHORTNESS OF BREATH 01/29/19   Mikey Kirschner, MD  polyethylene glycol-electrolytes (NULYTELY) 420 g solution As directed 08/18/20   Erenest Rasher, PA-C    Allergies as of 08/19/2020 - Review Complete 07/11/2020  Allergen Reaction Noted   Atorvastatin Other (See Comments) 07/11/2012   Zetia [ezetimibe] Other (See Comments) 07/11/2012   Zocor [simvastatin] Other (See Comments) 07/11/2012   Codeine Other (See Comments) 08/03/2011    Family History  Problem Relation Age of Onset   Heart attack Father    Colon cancer Brother 34    Social History   Socioeconomic History   Marital status: Married    Spouse name: Not on file   Number of children: Not on file   Years of education: Not on file   Highest education level: Not on file  Occupational History   Not on file  Tobacco Use   Smoking status: Former   Smokeless tobacco: Former    Quit date: 07/14/1985  Vaping Use   Vaping Use: Never used  Substance and Sexual Activity   Alcohol use: Yes    Comment: occasionally- 3-4 mixed drinks per week.   Drug use: No   Sexual activity: Yes    Birth control/protection: None  Other Topics Concern   Not on file  Social History Narrative   Not on file   Social Determinants of Health   Financial Resource Strain: Low Risk    Difficulty of Paying Living Expenses: Not hard at all  Food Insecurity: No Food Insecurity   Worried About Charity fundraiser in the Last Year: Never true   [place] in the Last Year: Never true  Transportation Needs: No Transportation Needs   Lack of Transportation (Medical): No   Lack of Transportation (Non-Medical): No  Physical Activity: Insufficiently Active   Days of Exercise per Week: 1 day   Minutes of Exercise per Session: 10 min  Stress: No Stress Concern Present   Feeling of Stress : Not at all  Social Connections: Socially Integrated   Frequency of  Communication with Friends and Family: Twice a week   Frequency of Social Gatherings with Friends and Family: Once a week   Attends Religious Services: More than 4 times per year   Active Member of [employer] or Organizations: Yes   Attends Archivist Meetings: 1 to 4 times per year   Marital Status: Married  Human resources officer Violence: Not At Risk   Fear of Current or Ex-Partner: No   Emotionally Abused: No   Physically Abused: No   Sexually Abused: No    Review of Systems: See HPI, otherwise negative ROS  Physical Exam: BP (!) 198/73   Temp 98.2 F (36.8 C) (Oral)   Resp 13   Ht 5\' 9"  (1.753 m)  Wt 99.8 kg   SpO2 100%   BMI 32.49 kg/m  General:   Alert,  Well-developed, well-nourished, pleasant and cooperative in NAD   Neck:  Supple; no masses or thyromegaly. No significant cervical adenopathy. Lungs:  Clear throughout to auscultation.   No wheezes, crackles, or rhonchi. No acute distress. Heart:  Regular rate and rhythm; no murmurs, clicks, rubs,  or gallops. Abdomen: Non-distended, normal bowel sounds.  Soft and nontender without appreciable mass or hepatosplenomegaly.  Pulses:  Normal pulses noted. Extremities:  Without clubbing or edema.  Impression/Plan:  75 year old gentleman history of multiple colonic adenomas and a positive family history colon cancer in first-degree relative here for surveillance colonoscopy per plan. The risks, benefits, limitations, alternatives and imponderables have been reviewed with the patient. Questions have been answered. All parties are agreeable.       Notice: This dictation was prepared with Dragon dictation along with smaller phrase technology. Any transcriptional errors that result from this process are unintentional and may not be corrected upon review.

## 2020-10-03 NOTE — Discharge Instructions (Signed)
  Colonoscopy Discharge Instructions  Read the instructions outlined below and refer to this sheet in the next few weeks. These discharge instructions provide you with general information on caring for yourself after you leave the hospital. Your doctor may also give you specific instructions. While your treatment has been planned according to the most current medical practices available, unavoidable complications occasionally occur. If you have any problems or questions after discharge, call Dr. Gala Romney at [phone]. ACTIVITY You may resume your regular activity, but move at a slower pace for the next 24 hours.  Take frequent rest periods for the next 24 hours.  Walking will help get rid of the air and reduce the bloated feeling in your belly (abdomen).  No driving for 24 hours (because of the medicine (anesthesia) used during the test).   Do not sign any important legal documents or operate any machinery for 24 hours (because of the anesthesia used during the test).  NUTRITION Drink plenty of fluids.  You may resume your normal diet as instructed by your doctor.  Begin with a light meal and progress to your normal diet. Heavy or fried foods are harder to digest and may make you feel sick to your stomach (nauseated).  Avoid alcoholic beverages for 24 hours or as instructed.  MEDICATIONS You may resume your normal medications unless your doctor tells you otherwise.  WHAT YOU CAN EXPECT TODAY Some feelings of bloating in the abdomen.  Passage of more gas than usual.  Spotting of blood in your stool or on the toilet paper.  IF YOU HAD POLYPS REMOVED DURING THE COLONOSCOPY: No aspirin products for 7 days or as instructed.  No alcohol for 7 days or as instructed.  Eat a soft diet for the next 24 hours.  FINDING OUT THE RESULTS OF YOUR TEST Not all test results are available during your visit. If your test results are not back during the visit, make an appointment with your caregiver to find out the  results. Do not assume everything is normal if you have not heard from your caregiver or the medical facility. It is important for you to follow up on all of your test results.  SEEK IMMEDIATE MEDICAL ATTENTION IF: You have more than a spotting of blood in your stool.  Your belly is swollen (abdominal distention).  You are nauseated or vomiting.  You have a temperature over 101.  You have abdominal pain or discomfort that is severe or gets worse throughout the day.    Diverticulosis and polyp information provided  9 polyps removed in your colon today-all were small  Further recommendations to follow pending review of pathology report  At patient request, I called Juliann Pulse at [phone] -reviewed findings and recommendations

## 2020-10-03 NOTE — Transfer of Care (Signed)
Immediate Anesthesia Transfer of Care Note  Patient: Kyle Dunlap  Procedure(s) Performed: COLONOSCOPY WITH PROPOFOL POLYPECTOMY  Patient Location: PACU  Anesthesia Type:General  Level of Consciousness: awake  Airway & Oxygen Therapy: Patient Spontanous Breathing  Post-op Assessment: Report given to RN and Post -op Vital signs reviewed and stable  Post vital signs: Reviewed and stable  Last Vitals:  Vitals Value Taken Time  BP    Temp    Pulse    Resp    SpO2      Last Pain:  Vitals:   10/03/20 0855  TempSrc:   PainSc: 0-No pain      Patients Stated Pain Goal: 6 (25/36/64 4034)  Complications: No notable events documented.

## 2020-10-03 NOTE — Anesthesia Preprocedure Evaluation (Signed)
Anesthesia Evaluation  Patient identified by MRN, date of birth, ID band Patient awake    Reviewed: Allergy & Precautions, H&P , NPO status , Patient's Chart, lab work & pertinent test results, reviewed documented beta blocker date and time   Airway Mallampati: II  TM Distance: >3 FB Neck ROM: full    Dental no notable dental hx.    Pulmonary neg pulmonary ROS, former smoker,    Pulmonary exam normal breath sounds clear to auscultation       Cardiovascular Exercise Tolerance: Good hypertension, negative cardio ROS   Rhythm:regular Rate:Normal     Neuro/Psych PSYCHIATRIC DISORDERS Anxiety  Neuromuscular disease    GI/Hepatic Neg liver ROS, GERD  Medicated,  Endo/Other  negative endocrine ROSdiabetes  Renal/GU CRFRenal disease  negative genitourinary   Musculoskeletal   Abdominal   Peds  Hematology negative hematology ROS (+)   Anesthesia Other Findings   Reproductive/Obstetrics negative OB ROS                             Anesthesia Physical Anesthesia Plan  ASA: 2  Anesthesia Plan: General   Post-op Pain Management:    Induction:   PONV Risk Score and Plan: Propofol infusion  Airway Management Planned:   Additional Equipment:   Intra-op Plan:   Post-operative Plan:   Informed Consent: I have reviewed the patients History and Physical, chart, labs and discussed the procedure including the risks, benefits and alternatives for the proposed anesthesia with the patient or authorized representative who has indicated his/her understanding and acceptance.     Dental Advisory Given  Plan Discussed with: CRNA  Anesthesia Plan Comments:         Anesthesia Quick Evaluation

## 2020-10-04 ENCOUNTER — Encounter: Payer: Self-pay | Admitting: Family Medicine

## 2020-10-04 ENCOUNTER — Encounter: Payer: Self-pay | Admitting: Internal Medicine

## 2020-10-04 LAB — SURGICAL PATHOLOGY

## 2020-10-04 NOTE — Progress Notes (Signed)
.  rmr 

## 2020-10-10 ENCOUNTER — Encounter (HOSPITAL_COMMUNITY): Payer: Self-pay | Admitting: Internal Medicine

## 2020-10-15 ENCOUNTER — Other Ambulatory Visit: Payer: Self-pay | Admitting: Family Medicine

## 2020-10-15 DIAGNOSIS — I10 Essential (primary) hypertension: Secondary | ICD-10-CM

## 2020-11-11 ENCOUNTER — Other Ambulatory Visit: Payer: Self-pay | Admitting: Nurse Practitioner

## 2020-11-11 DIAGNOSIS — I10 Essential (primary) hypertension: Secondary | ICD-10-CM

## 2020-11-15 ENCOUNTER — Ambulatory Visit: Payer: Medicare Other | Admitting: Family Medicine

## 2020-11-15 ENCOUNTER — Ambulatory Visit (HOSPITAL_COMMUNITY)
Admission: RE | Admit: 2020-11-15 | Discharge: 2020-11-15 | Disposition: A | Discharge status: Home / Self Care | Payer: Medicare Other | Source: Ambulatory Visit | Attending: Family Medicine | Admitting: Family Medicine

## 2020-11-15 ENCOUNTER — Other Ambulatory Visit: Payer: Self-pay

## 2020-11-15 VITALS — BP 162/76 | HR 72 | Ht 69.0 in | Wt 220.4 lb

## 2020-11-15 DIAGNOSIS — M545 Low back pain, unspecified: Secondary | ICD-10-CM | POA: Diagnosis present

## 2020-11-15 DIAGNOSIS — Z23 Encounter for immunization: Secondary | ICD-10-CM | POA: Diagnosis not present

## 2020-11-15 DIAGNOSIS — G8929 Other chronic pain: Secondary | ICD-10-CM | POA: Diagnosis present

## 2020-11-15 DIAGNOSIS — E1121 Type 2 diabetes mellitus with diabetic nephropathy: Secondary | ICD-10-CM | POA: Diagnosis not present

## 2020-11-15 DIAGNOSIS — E1142 Type 2 diabetes mellitus with diabetic polyneuropathy: Secondary | ICD-10-CM

## 2020-11-15 DIAGNOSIS — I10 Essential (primary) hypertension: Secondary | ICD-10-CM

## 2020-11-15 DIAGNOSIS — E1140 Type 2 diabetes mellitus with diabetic neuropathy, unspecified: Secondary | ICD-10-CM | POA: Insufficient documentation

## 2020-11-15 DIAGNOSIS — N1832 Chronic kidney disease, stage 3b: Secondary | ICD-10-CM | POA: Diagnosis not present

## 2020-11-15 MED ORDER — EMPAGLIFLOZIN 10 MG PO TABS: 10.0000 mg | ORAL_TABLET | Freq: Every day | ORAL | 1 refills | Status: DC

## 2020-11-15 MED ORDER — DULOXETINE HCL 60 MG PO CPEP: 60.0000 mg | ORAL_CAPSULE | Freq: Every day | ORAL | 1 refills | Status: DC

## 2020-11-15 NOTE — Patient Instructions (Signed)
Start Jardiance and Cymbalta.  Labs today.  Xray today.  Stop the Januvia.  Follow up in 3 months.  Take care  Dr. Lacinda Axon

## 2020-11-15 NOTE — Assessment & Plan Note (Signed)
Did not tolerate Gabapentin.  Starting Cymbalta.

## 2020-11-15 NOTE — Assessment & Plan Note (Signed)
Recheck CMP today. Will likely need referral to Nephrology soon. Starting Jardiance for DM-2 and renal protection.

## 2020-11-15 NOTE — Assessment & Plan Note (Signed)
BP elevated today. Reports BP's are good at home. Will continue to monitor closely.  Continue diltiazem, triamterene HCTZ, and enalapril.

## 2020-11-15 NOTE — Assessment & Plan Note (Signed)
Uncontrolled.  A1C today. Starting Jardiance. D/C Januvia. Recheck in 3 months. Will add Rybelsus if needed at that time.

## 2020-11-15 NOTE — Progress Notes (Signed)
Subjective:  Patient ID: Kyle Dunlap, male    DOB: 1945-04-06  Age: 75 y.o. MRN: [id number]  CC: Chief Complaint  Patient presents with   Diabetes    Follow up Continued burning in feet from diabetes- gabapentin made feet swell so he stopped it    HPI:  75 year old male with a complicated past medical history presents for follow-up.  Back pain/hip pain Patient reports ongoing chronic back pain/hip pain.  He localizes the pain to the right-sided low back. He states that he has been evaluated at the [place] previously and was told that his x-rays were unremarkable. He states that the pain has been going on for the past year.  Worse with activity.  Better at rest.  No reports of radicular symptoms although it is somewhat difficult to tell due to diabetic neuropathy.  Diabetic neuropathy Previously placed on gabapentin.  He states that he did not tolerate this due to swelling in his lower extremities.  He states that is quite troublesome particularly at night.  He states that his feet feel like they are burning.  He would like to discuss medication options regarding his pain.  Hypertension Patient states that his blood pressures are well controlled at home.  BP elevated currently.  He is currently on Cardizem, enalapril, and triamterene HCTZ.  DM2 with complications Last M6K available was in June and was 7.5. He does not check his blood sugars.  He is currently on glipizide and Januvia. Needs better control.  We will discuss changing his medications today.   Patient Active Problem List   Diagnosis Date Noted   Type 2 diabetes mellitus with diabetic nephropathy, without long-term current use of insulin ([place]) 11/15/2020   Stage 3b chronic kidney disease ([place]) 11/15/2020   Chronic right-sided low back pain without sciatica 11/15/2020   Diabetic neuropathy ([place]) 11/15/2020   Family history of colon cancer 07/11/2020   Statin myopathy 05/09/2020   Aortic atherosclerosis ([place])  05/06/2020   Meniere's disease 03/20/2015   Hyperlipidemia LDL goal <70 07/15/2012   Essential hypertension, benign 07/15/2012   GERD (gastroesophageal reflux disease) 08/03/2011   H/O adenomatous polyp of colon 08/03/2011    Social Hx   Social History   Socioeconomic History   Marital status: Married    Spouse name: Not on file   Number of children: Not on file   Years of education: Not on file   Highest education level: Not on file  Occupational History   Not on file  Tobacco Use   Smoking status: Former   Smokeless tobacco: Former    Quit date: 07/14/1985  Vaping Use   Vaping Use: Never used  Substance and Sexual Activity   Alcohol use: Yes    Comment: occasionally- 3-4 mixed drinks per week.   Drug use: No   Sexual activity: Yes    Birth control/protection: None  Other Topics Concern   Not on file  Social History Narrative   Not on file   Social Determinants of Health   Financial Resource Strain: Low Risk    Difficulty of Paying Living Expenses: Not hard at all  Food Insecurity: No Food Insecurity   Worried About Charity fundraiser in the Last Year: Never true   [place] in the Last Year: Never true  Transportation Needs: No Transportation Needs   Lack of Transportation (Medical): No   Lack of Transportation (Non-Medical): No  Physical Activity: Insufficiently Active   Days of Exercise per Week:  1 day   Minutes of Exercise per Session: 10 min  Stress: No Stress Concern Present   Feeling of Stress : Not at all  Social Connections: Socially Integrated   Frequency of Communication with Friends and Family: Twice a week   Frequency of Social Gatherings with Friends and Family: Once a week   Attends Religious Services: More than 4 times per year   Active Member of [employer] or Organizations: Yes   Attends Archivist Meetings: 1 to 4 times per year   Marital Status: Married    Review of Systems Per HPI  Objective:  BP (!) 162/76   Pulse 72    Ht '5\' 9"'  (1.753 m)   Wt 220 lb 6.4 oz (100 kg)   SpO2 97%   BMI 32.55 kg/m   BP/Weight 11/15/2020 10/03/2020 0/94/7096  Systolic BP 283 662 947  Diastolic BP 76 47 62  Wt. (Lbs) 220.4 220 221.2  BMI 32.55 32.49 32.67    Physical Exam Vitals and nursing note reviewed.  Constitutional:      General: He is not in acute distress.    Appearance: Normal appearance. He is not ill-appearing.  HENT:     Head: Normocephalic and atraumatic.  Eyes:     General:        Right eye: No discharge.        Left eye: No discharge.     Conjunctiva/sclera: Conjunctivae normal.  Cardiovascular:     Rate and Rhythm: Normal rate and regular rhythm.     Heart sounds: Murmur heard.  Pulmonary:     Effort: Pulmonary effort is normal.     Breath sounds: Normal breath sounds. No wheezing, rhonchi or rales.  Musculoskeletal:     Comments: Mild tenderness of the right paraspinal region of the lumbar spine.  Neurological:     Mental Status: He is alert.  Psychiatric:        Mood and Affect: Mood normal.        Behavior: Behavior normal.    Lab Results  Component Value Date   WBC 8.1 01/01/2020   HGB 13.3 01/01/2020   HCT 37.6 01/01/2020   PLT 293 01/01/2020   GLUCOSE 153 (H) 09/30/2020   CHOL 174 05/06/2020   TRIG 188 (H) 05/06/2020   HDL 38 (L) 05/06/2020   LDLCALC 103 (H) 05/06/2020   ALT 33 05/06/2020   AST 19 05/06/2020   NA 133 (L) 09/30/2020   K 5.0 09/30/2020   CL 103 09/30/2020   CREATININE 1.85 (H) 09/30/2020   BUN 34 (H) 09/30/2020   CO2 23 09/30/2020   PSA 1.92 03/15/2014   HGBA1C 7.5 (H) 06/29/2020   MICROALBUR 60.2 (H) 03/15/2014     Assessment & Plan:   Problem List Items Addressed This Visit       Cardiovascular and Mediastinum   Essential hypertension, benign    BP elevated today. Reports BP's are good at home. Will continue to monitor closely.  Continue diltiazem, triamterene HCTZ, and enalapril.        Endocrine   Type 2 diabetes mellitus with diabetic  nephropathy, without long-term current use of insulin ([place]) - Primary    Uncontrolled.  A1C today. Starting Jardiance. D/C Januvia. Recheck in 3 months. Will add Rybelsus if needed at that time.      Relevant Medications   empagliflozin (JARDIANCE) 10 MG TABS tablet   Other Relevant Orders   HgB A1c   Diabetic neuropathy ([place])  Did not tolerate Gabapentin.  Starting Cymbalta.      Relevant Medications   empagliflozin (JARDIANCE) 10 MG TABS tablet     Genitourinary   Stage 3b chronic kidney disease (HCC)    Recheck CMP today. Will likely need referral to Nephrology soon. Starting Jardiance for DM-2 and renal protection.      Relevant Orders   CMP14+EGFR     Other   Chronic right-sided low back pain without sciatica    Sending for xray for further evaluation.      Relevant Medications   DULoxetine (CYMBALTA) 60 MG capsule   Other Relevant Orders   DG Lumbar Spine Complete   DG Hip Unilat W OR W/O Pelvis 2-3 Views Right   Other Visit Diagnoses     Need for vaccination       Relevant Orders   Flu Vaccine QUAD High Dose(Fluad) (Completed)       Meds ordered this encounter  Medications   DULoxetine (CYMBALTA) 60 MG capsule    Sig: Take 1 capsule (60 mg total) by mouth daily.    Dispense:  90 capsule    Refill:  1   empagliflozin (JARDIANCE) 10 MG TABS tablet    Sig: Take 1 tablet (10 mg total) by mouth daily.    Dispense:  90 tablet    Refill:  1    Follow-up:  Return in about 3 months (around 02/15/2021).  [place]

## 2020-11-15 NOTE — Assessment & Plan Note (Signed)
Sending for xray for further evaluation.

## 2020-11-16 LAB — CMP14+EGFR
ALT: 28 IU/L (ref 0–44)
AST: 23 IU/L (ref 0–40)
Albumin/Globulin Ratio: 1.6 (ref 1.2–2.2)
Albumin: 4.5 g/dL (ref 3.7–4.7)
Alkaline Phosphatase: 122 IU/L — ABNORMAL HIGH (ref 44–121)
BUN/Creatinine Ratio: 16 (ref 10–24)
BUN: 26 mg/dL (ref 8–27)
Bilirubin Total: 0.4 mg/dL (ref 0.0–1.2)
CO2: 20 mmol/L (ref 20–29)
Calcium: 9.5 mg/dL (ref 8.6–10.2)
Chloride: 98 mmol/L (ref 96–106)
Creatinine, Ser: 1.62 mg/dL — ABNORMAL HIGH (ref 0.76–1.27)
Globulin, Total: 2.9 g/dL (ref 1.5–4.5)
Glucose: 209 mg/dL — ABNORMAL HIGH (ref 70–99)
Potassium: 5.7 mmol/L — ABNORMAL HIGH (ref 3.5–5.2)
Sodium: 134 mmol/L (ref 134–144)
Total Protein: 7.4 g/dL (ref 6.0–8.5)
eGFR: 43 mL/min/{1.73_m2} — ABNORMAL LOW (ref >59)

## 2020-11-16 LAB — HEMOGLOBIN A1C
Est. average glucose Bld gHb Est-mCnc: 186 mg/dL
Hgb A1c MFr Bld: 8.1 % — ABNORMAL HIGH (ref 4.8–5.6)

## 2020-11-22 ENCOUNTER — Other Ambulatory Visit: Payer: Self-pay | Admitting: Family Medicine

## 2020-11-22 DIAGNOSIS — E119 Type 2 diabetes mellitus without complications: Secondary | ICD-10-CM

## 2020-11-28 ENCOUNTER — Other Ambulatory Visit: Payer: Self-pay | Admitting: Family Medicine

## 2020-11-28 DIAGNOSIS — I10 Essential (primary) hypertension: Secondary | ICD-10-CM

## 2020-11-28 DIAGNOSIS — E119 Type 2 diabetes mellitus without complications: Secondary | ICD-10-CM

## 2020-12-06 ENCOUNTER — Other Ambulatory Visit: Payer: Self-pay | Admitting: Nurse Practitioner

## 2020-12-06 DIAGNOSIS — I10 Essential (primary) hypertension: Secondary | ICD-10-CM

## 2020-12-29 ENCOUNTER — Other Ambulatory Visit: Payer: Self-pay | Admitting: Family Medicine

## 2020-12-29 DIAGNOSIS — H8103 Meniere's disease, bilateral: Secondary | ICD-10-CM

## 2020-12-30 ENCOUNTER — Other Ambulatory Visit: Payer: Self-pay | Admitting: Family Medicine

## 2021-01-01 ENCOUNTER — Other Ambulatory Visit: Payer: Self-pay | Admitting: Family Medicine

## 2021-01-08 ENCOUNTER — Encounter: Payer: Self-pay | Admitting: Family Medicine

## 2021-01-09 ENCOUNTER — Other Ambulatory Visit: Payer: Self-pay | Admitting: Family Medicine

## 2021-01-09 DIAGNOSIS — H8103 Meniere's disease, bilateral: Secondary | ICD-10-CM

## 2021-01-09 MED ORDER — DIAZEPAM 2 MG PO TABS: 2.0000 mg | ORAL_TABLET | Freq: Every day | ORAL | 1 refills | Status: DC | PRN

## 2021-01-12 ENCOUNTER — Other Ambulatory Visit: Payer: Self-pay | Admitting: Family Medicine

## 2021-01-12 DIAGNOSIS — H8103 Meniere's disease, bilateral: Secondary | ICD-10-CM

## 2021-01-12 MED ORDER — DIAZEPAM 2 MG PO TABS: 2.0000 mg | ORAL_TABLET | Freq: Every day | ORAL | 1 refills | Status: DC | PRN

## 2021-01-22 DIAGNOSIS — R011 Cardiac murmur, unspecified: Secondary | ICD-10-CM

## 2021-01-22 HISTORY — DX: Cardiac murmur, unspecified: R01.1

## 2021-01-28 ENCOUNTER — Encounter: Payer: Self-pay | Admitting: Family Medicine

## 2021-01-29 ENCOUNTER — Other Ambulatory Visit: Payer: Self-pay | Admitting: Cardiovascular Disease

## 2021-02-06 ENCOUNTER — Telehealth: Payer: Self-pay

## 2021-02-06 DIAGNOSIS — R0989 Other specified symptoms and signs involving the circulatory and respiratory systems: Secondary | ICD-10-CM

## 2021-02-06 NOTE — Telephone Encounter (Signed)
US Carotid Bilateral Duplex due in 8 months per Dr. Johnsie Cancel. Orders entered.

## 2021-02-24 ENCOUNTER — Other Ambulatory Visit: Payer: Self-pay | Admitting: Family Medicine

## 2021-02-24 DIAGNOSIS — I10 Essential (primary) hypertension: Secondary | ICD-10-CM

## 2021-02-24 DIAGNOSIS — E119 Type 2 diabetes mellitus without complications: Secondary | ICD-10-CM

## 2021-03-27 ENCOUNTER — Other Ambulatory Visit: Payer: Self-pay | Admitting: Family Medicine

## 2021-03-27 DIAGNOSIS — H8103 Meniere's disease, bilateral: Secondary | ICD-10-CM

## 2021-05-09 ENCOUNTER — Encounter: Payer: Self-pay | Admitting: Family Medicine

## 2021-05-09 ENCOUNTER — Ambulatory Visit: Payer: Medicare Other | Admitting: Family Medicine

## 2021-05-09 DIAGNOSIS — E1121 Type 2 diabetes mellitus with diabetic nephropathy: Secondary | ICD-10-CM

## 2021-05-09 DIAGNOSIS — N1832 Chronic kidney disease, stage 3b: Secondary | ICD-10-CM | POA: Diagnosis not present

## 2021-05-09 DIAGNOSIS — I10 Essential (primary) hypertension: Secondary | ICD-10-CM

## 2021-05-09 DIAGNOSIS — G8929 Other chronic pain: Secondary | ICD-10-CM

## 2021-05-09 DIAGNOSIS — M545 Low back pain, unspecified: Secondary | ICD-10-CM | POA: Diagnosis not present

## 2021-05-09 NOTE — Progress Notes (Signed)
? ?Subjective:  ?Patient ID: Kyle Dunlap, male    DOB: [birth year]  Age: 76 y.o. MRN: [id number] ? ?CC: ?Chief Complaint  ?Patient presents with  ? Diabetes  ?  Back and hip pain. Sugars have been elevated (has not been eating right) 160 yesterday.  ?Pt has to set up on side of bed each morning before getting up  ? ? ?HPI: ? ?76 year old male with HTN, DM-2 with complications, CKD, HLD, Chronic back pain presents for follow-up. ? ?Patient has recently been seen at the [place].  He had labs done.  These are unavailable to me.  Per his report, his renal function was worsening and he was instructed to stop Jardiance.  Last creatinine that is available to me was 1.6.  He has an upcoming appointment with nephrology at the [facility]. ? ?Also, patient was recently started on levothyroxine. ? ?Patient A1c drawn at the [place] as well.  Per his report his A1c was below 7. He is currently on glipizide only. ? ?Continues to have chronic low back pain as well as right hip pain.  Prior x-rays have revealed mild degenerative changes. ? ? ?Patient Active Problem List  ? Diagnosis Date Noted  ? Type 2 diabetes mellitus with diabetic nephropathy, without long-term current use of insulin ([place]) 11/15/2020  ? Stage 3b chronic kidney disease ([place]) 11/15/2020  ? Chronic right-sided low back pain without sciatica 11/15/2020  ? Diabetic neuropathy ([place]) 11/15/2020  ? Family history of colon cancer 07/11/2020  ? Statin myopathy 05/09/2020  ? Aortic atherosclerosis ([place]) 05/06/2020  ? Meniere's disease 03/20/2015  ? Hyperlipidemia LDL goal <70 07/15/2012  ? Essential hypertension, benign 07/15/2012  ? GERD (gastroesophageal reflux disease) 08/03/2011  ? H/O adenomatous polyp of colon 08/03/2011  ? ? ?Social Hx   ?Social History  ? ?Socioeconomic History  ? Marital status: Married  ?  Spouse name: Not on file  ? Number of children: Not on file  ? Years of education: Not on file  ? Highest education level: Not on file  ?Occupational History  ? Not on file   ?Tobacco Use  ? Smoking status: Former  ? Smokeless tobacco: Former  ?  Quit date: 07/14/1985  ?Vaping Use  ? Vaping Use: Never used  ?Substance and Sexual Activity  ? Alcohol use: Yes  ?  Comment: occasionally- 3-4 mixed drinks per week.  ? Drug use: No  ? Sexual activity: Yes  ?  Birth control/protection: None  ?Other Topics Concern  ? Not on file  ?Social History Narrative  ? Not on file  ? ?Social Determinants of Health  ? ?Financial Resource Strain: Low Risk   ? Difficulty of Paying Living Expenses: Not hard at all  ?Food Insecurity: No Food Insecurity  ? Worried About Charity fundraiser in the Last Year: Never true  ? Ran Out of Food in the Last Year: Never true  ?Transportation Needs: No Transportation Needs  ? Lack of Transportation (Medical): No  ? Lack of Transportation (Non-Medical): No  ?Physical Activity: Insufficiently Active  ? Days of Exercise per Week: 1 day  ? Minutes of Exercise per Session: 10 min  ?Stress: No Stress Concern Present  ? Feeling of Stress : Not at all  ?Social Connections: Socially Integrated  ? Frequency of Communication with Friends and Family: Twice a week  ? Frequency of Social Gatherings with Friends and Family: Once a week  ? Attends Religious Services: More than 4 times per year  ?  Active Member of Clubs or Organizations: Yes  ? Attends Archivist Meetings: 1 to 4 times per year  ? Marital Status: Married  ? ? ?Review of Systems ?Per HPI ? ?Objective:  ?BP (!) 154/74   Pulse 75   Temp 98.2 ?F (36.8 ?C)   Wt 212 lb 12.8 oz (96.5 kg)   SpO2 91%   BMI 31.43 kg/m?  ? ? ?  05/09/2021  ? 10:26 AM 11/15/2020  ?  1:13 PM 10/03/2020  ?  9:35 AM  ?BP/Weight  ?Systolic BP 409 735 329  ?Diastolic BP 74 76 47  ?Wt. (Lbs) 212.8 220.4   ?BMI 31.43 kg/m2 32.55 kg/m2   ? ? ?Physical Exam ?Vitals and nursing note reviewed.  ?Constitutional:   ?   General: He is not in acute distress. ?   Appearance: Normal appearance.  ?HENT:  ?   Head: Normocephalic and atraumatic.  ?Eyes:  ?    General:     ?   Right eye: No discharge.     ?   Left eye: No discharge.  ?   Conjunctiva/sclera: Conjunctivae normal.  ?Cardiovascular:  ?   Rate and Rhythm: Normal rate and regular rhythm.  ?Pulmonary:  ?   Effort: Pulmonary effort is normal.  ?   Breath sounds: Normal breath sounds. No wheezing or rales.  ?Neurological:  ?   Mental Status: He is alert.  ?Psychiatric:     ?   Mood and Affect: Mood normal.     ?   Behavior: Behavior normal.  ? ? ?Lab Results  ?Component Value Date  ? WBC 8.1 01/01/2020  ? HGB 13.3 01/01/2020  ? HCT 37.6 01/01/2020  ? PLT 293 01/01/2020  ? GLUCOSE 209 (H) 11/15/2020  ? CHOL 174 05/06/2020  ? TRIG 188 (H) 05/06/2020  ? HDL 38 (L) 05/06/2020  ? LDLCALC 103 (H) 05/06/2020  ? ALT 28 11/15/2020  ? AST 23 11/15/2020  ? NA 134 11/15/2020  ? K 5.7 (H) 11/15/2020  ? CL 98 11/15/2020  ? CREATININE 1.62 (H) 11/15/2020  ? BUN 26 11/15/2020  ? CO2 20 11/15/2020  ? PSA 1.92 03/15/2014  ? HGBA1C 8.1 (H) 11/15/2020  ? MICROALBUR 60.2 (H) 03/15/2014  ? ? ? ?Assessment & Plan:  ? ?Problem List Items Addressed This Visit   ? ?  ? Cardiovascular and Mediastinum  ? Essential hypertension, benign  ?  BP mildly elevated today.  Continue current medications.  Awaiting on labs to be brought in for further review. ? ?  ?  ?  ? Endocrine  ? Type 2 diabetes mellitus with diabetic nephropathy, without long-term current use of insulin ([place])  ?  Patient reports that his most recent A1c was at goal.  Awaiting labs.  Continue glipizide. ? ?  ?  ?  ? Genitourinary  ? Stage 3b chronic kidney disease ([place])  ?  Has upcoming appointment with nephrology. ? ?  ?  ?  ? Other  ? Chronic right-sided low back pain without sciatica  ?  Advised patient to follow-up with the [facility] as he would likely benefit from corticosteroid injections. ? ?  ?  ? ?Thersa Salt DO ?[place] ? ?

## 2021-05-09 NOTE — Assessment & Plan Note (Signed)
Has upcoming appointment with nephrology. ?

## 2021-05-09 NOTE — Assessment & Plan Note (Signed)
BP mildly elevated today.  Continue current medications.  Awaiting on labs to be brought in for further review. ?

## 2021-05-09 NOTE — Patient Instructions (Signed)
Please bring in the records regarding his labs. ? ?Follow up in 3-6 months. ? ?Call with concerns. ? ?Take care ? ?Dr. Lacinda Axon  ?

## 2021-05-09 NOTE — Assessment & Plan Note (Signed)
Advised patient to follow-up with the [facility] as he would likely benefit from corticosteroid injections. ?

## 2021-05-09 NOTE — Assessment & Plan Note (Signed)
Patient reports that his most recent A1c was at goal.  Awaiting labs.  Continue glipizide. ?

## 2021-05-11 ENCOUNTER — Other Ambulatory Visit: Payer: Self-pay

## 2021-05-11 MED ORDER — DULOXETINE HCL 60 MG PO CPEP: 60.0000 mg | ORAL_CAPSULE | Freq: Every day | ORAL | 1 refills | Status: DC

## 2021-05-25 ENCOUNTER — Other Ambulatory Visit: Payer: Self-pay | Admitting: Family Medicine

## 2021-05-25 DIAGNOSIS — I10 Essential (primary) hypertension: Secondary | ICD-10-CM

## 2021-05-25 DIAGNOSIS — E119 Type 2 diabetes mellitus without complications: Secondary | ICD-10-CM

## 2021-06-26 ENCOUNTER — Other Ambulatory Visit (HOSPITAL_COMMUNITY): Payer: Self-pay | Admitting: Internal Medicine

## 2021-06-26 ENCOUNTER — Other Ambulatory Visit: Payer: Self-pay | Admitting: Internal Medicine

## 2021-07-05 ENCOUNTER — Other Ambulatory Visit (HOSPITAL_COMMUNITY): Payer: Self-pay | Admitting: Internal Medicine

## 2021-07-05 ENCOUNTER — Other Ambulatory Visit: Payer: Self-pay | Admitting: Internal Medicine

## 2021-07-05 DIAGNOSIS — E1022 Type 1 diabetes mellitus with diabetic chronic kidney disease: Secondary | ICD-10-CM

## 2021-07-11 ENCOUNTER — Ambulatory Visit (HOSPITAL_COMMUNITY)
Admission: RE | Admit: 2021-07-11 | Discharge: 2021-07-11 | Disposition: A | Discharge status: Home / Self Care | Payer: Medicare Other | Source: Ambulatory Visit | Attending: Cardiovascular Disease | Admitting: Cardiovascular Disease

## 2021-07-11 DIAGNOSIS — R0989 Other specified symptoms and signs involving the circulatory and respiratory systems: Secondary | ICD-10-CM | POA: Insufficient documentation

## 2021-07-14 ENCOUNTER — Ambulatory Visit (HOSPITAL_COMMUNITY)
Admission: RE | Admit: 2021-07-14 | Discharge: 2021-07-14 | Disposition: A | Discharge status: Home / Self Care | Payer: No Typology Code available for payment source | Source: Ambulatory Visit | Attending: Internal Medicine | Admitting: Internal Medicine

## 2021-07-14 ENCOUNTER — Telehealth: Payer: Self-pay | Admitting: *Deleted

## 2021-07-14 DIAGNOSIS — E1022 Type 1 diabetes mellitus with diabetic chronic kidney disease: Secondary | ICD-10-CM | POA: Diagnosis present

## 2021-07-14 DIAGNOSIS — I6523 Occlusion and stenosis of bilateral carotid arteries: Secondary | ICD-10-CM

## 2021-07-14 DIAGNOSIS — N184 Chronic kidney disease, stage 4 (severe): Secondary | ICD-10-CM | POA: Diagnosis present

## 2021-07-26 ENCOUNTER — Telehealth: Payer: Self-pay | Admitting: Cardiovascular Disease

## 2021-07-26 NOTE — Addendum Note (Signed)
Addended by: Levonne Hubert on: 07/26/2021 04:35 PM   Modules accepted: Orders

## 2021-07-26 NOTE — Telephone Encounter (Signed)
Checking percert on the following patient for testing scheduled at [facility].     CT ANGIO NECK W CM & OR WO CM  08/22/2021

## 2021-07-27 ENCOUNTER — Other Ambulatory Visit: Payer: Self-pay

## 2021-07-27 DIAGNOSIS — I10 Essential (primary) hypertension: Secondary | ICD-10-CM

## 2021-07-28 ENCOUNTER — Encounter: Payer: Self-pay | Admitting: Family Medicine

## 2021-07-31 ENCOUNTER — Telehealth: Payer: Self-pay

## 2021-07-31 ENCOUNTER — Other Ambulatory Visit: Payer: Self-pay | Admitting: Family Medicine

## 2021-07-31 DIAGNOSIS — H8103 Meniere's disease, bilateral: Secondary | ICD-10-CM

## 2021-07-31 MED ORDER — DIAZEPAM 2 MG PO TABS: ORAL_TABLET | ORAL | 0 refills | Status: DC

## 2021-07-31 NOTE — Telephone Encounter (Signed)
Patient informed and was offered an appt for Thursday 08/03/21 and he stated he will need to cancel it and call back . He is aware of refill sent and drs recommendations.

## 2021-07-31 NOTE — Telephone Encounter (Signed)
Nurses I sent in a refill I would recommend follow-up if dizziness is getting worse It is hard to tell based on his message if he needs to be seen this week or next week. Please connect with patient, triage appropriately then to help set up thanks-Dr. Nicki Reaper

## 2021-08-03 ENCOUNTER — Ambulatory Visit: Payer: No Typology Code available for payment source | Admitting: Family Medicine

## 2021-08-09 ENCOUNTER — Encounter: Payer: Self-pay | Admitting: Family Medicine

## 2021-08-09 ENCOUNTER — Ambulatory Visit (INDEPENDENT_AMBULATORY_CARE_PROVIDER_SITE_OTHER): Payer: No Typology Code available for payment source | Admitting: Family Medicine

## 2021-08-09 VITALS — BP 140/70 | HR 63 | Temp 98.2°F | Wt 211.6 lb

## 2021-08-09 DIAGNOSIS — E1121 Type 2 diabetes mellitus with diabetic nephropathy: Secondary | ICD-10-CM | POA: Diagnosis not present

## 2021-08-09 DIAGNOSIS — H8103 Meniere's disease, bilateral: Secondary | ICD-10-CM

## 2021-08-09 DIAGNOSIS — E039 Hypothyroidism, unspecified: Secondary | ICD-10-CM

## 2021-08-09 DIAGNOSIS — N1832 Chronic kidney disease, stage 3b: Secondary | ICD-10-CM

## 2021-08-09 DIAGNOSIS — E785 Hyperlipidemia, unspecified: Secondary | ICD-10-CM | POA: Diagnosis not present

## 2021-08-09 DIAGNOSIS — I10 Essential (primary) hypertension: Secondary | ICD-10-CM

## 2021-08-09 MED ORDER — DIAZEPAM 2 MG PO TABS: ORAL_TABLET | ORAL | 1 refills | Status: DC

## 2021-08-09 NOTE — Patient Instructions (Signed)
Continue your medications.  Labs ordered.  Follow up in 3-6 months.

## 2021-08-10 NOTE — Assessment & Plan Note (Signed)
Stable.  Diazepam refilled.

## 2021-08-10 NOTE — Assessment & Plan Note (Signed)
Stable.  Continue current medications.

## 2021-08-10 NOTE — Assessment & Plan Note (Signed)
Unsure of current status.  Continue glipizide.  A1c ordered.

## 2021-08-10 NOTE — Assessment & Plan Note (Signed)
Continue Praluent.  Awaiting lipid panel results.

## 2021-08-10 NOTE — Progress Notes (Signed)
Subjective:  Patient ID: Kyle Dunlap, male    DOB: 1946/01/07  Age: 76 y.o. MRN: [id number]  CC: Chief Complaint  Patient presents with   Dizziness    Pt needing refill on Diazepam. Dizziness has been going for years; pt has Meniere's.     HPI:  76 year old male with hypertension, type 2 diabetes, Mnire's disease, chronic kidney disease stage III, hyperlipidemia presents for follow-up.  Patient's blood pressure is fairly well controlled.  He endorses compliance with his medication, hydralazine, triamterene/HCTZ, diltiazem, enalapril.  Patient suffers from Mnire's disease.  He responds well to diazepam and uses this nightly.  Needs refill.  Patient needs labs to reassess diabetes.  Last A1c was 8.1.  He is currently on just glipizide 5 mg, 2 tablets by mouth twice a day.  No recent lipid panel.  Does not tolerate statin.  He is on Praluent.  Patient Active Problem List   Diagnosis Date Noted   Type 2 diabetes mellitus with diabetic nephropathy, without long-term current use of insulin ([place]) 11/15/2020   Stage 3b chronic kidney disease ([place]) 11/15/2020   Chronic right-sided low back pain without sciatica 11/15/2020   Diabetic neuropathy ([place]) 11/15/2020   Statin myopathy 05/09/2020   Aortic atherosclerosis ([place]) 05/06/2020   Meniere's disease 03/20/2015   Hyperlipidemia LDL goal <70 07/15/2012   Essential hypertension, benign 07/15/2012   GERD (gastroesophageal reflux disease) 08/03/2011   H/O adenomatous polyp of colon 08/03/2011    Social Hx   Social History   Socioeconomic History   Marital status: Married    Spouse name: Not on file   Number of children: Not on file   Years of education: Not on file   Highest education level: Not on file  Occupational History   Not on file  Tobacco Use   Smoking status: Former   Smokeless tobacco: Former    Quit date: 07/14/1985  Vaping Use   Vaping Use: Never used  Substance and Sexual Activity   Alcohol use: Yes     Comment: occasionally- 3-4 mixed drinks per week.   Drug use: No   Sexual activity: Yes    Birth control/protection: None  Other Topics Concern   Not on file  Social History Narrative   Not on file   Social Determinants of Health   Financial Resource Strain: Low Risk  (09/20/2020)   Overall Financial Resource Strain (CARDIA)    Difficulty of Paying Living Expenses: Not hard at all  Food Insecurity: No Food Insecurity (09/20/2020)   Hunger Vital Sign    Worried About Running Out of Food in the Last Year: Never true    [place] in the Last Year: Never true  Transportation Needs: No Transportation Needs (09/20/2020)   PRAPARE - Hydrologist (Medical): No    Lack of Transportation (Non-Medical): No  Physical Activity: Insufficiently Active (09/20/2020)   Exercise Vital Sign    Days of Exercise per Week: 1 day    Minutes of Exercise per Session: 10 min  Stress: No Stress Concern Present (09/20/2020)   [place]    Feeling of Stress : Not at all  Social Connections: [place] (09/20/2020)   Social Connection and Isolation Panel [NHANES]    Frequency of Communication with Friends and Family: Twice a week    Frequency of Social Gatherings with Friends and Family: Once a week    Attends Religious Services: More than  4 times per year    Active Member of Clubs or Organizations: Yes    Attends Club or Organization Meetings: 1 to 4 times per year    Marital Status: Married    Review of Systems  Constitutional: Negative.   Neurological:  Positive for dizziness and light-headedness.   Objective:  BP 140/70   Pulse 63   Temp 98.2 F (36.8 C)   Wt 211 lb 9.6 oz (96 kg)   SpO2 98%   BMI 31.25 kg/m      08/09/2021    2:35 PM 08/09/2021    2:08 PM 05/09/2021   10:26 AM  BP/Weight  Systolic BP 140 175 154  Diastolic BP 70 73 74  Wt. (Lbs)  211.6 212.8  BMI  31.25 kg/m2  31.43 kg/m2    Physical Exam Vitals and nursing note reviewed.  Constitutional:      General: He is not in acute distress.    Appearance: Normal appearance.  HENT:     Head: Normocephalic and atraumatic.  Eyes:     General:        Right eye: No discharge.        Left eye: No discharge.     Conjunctiva/sclera: Conjunctivae normal.  Cardiovascular:     Rate and Rhythm: Normal rate and regular rhythm.  Pulmonary:     Effort: Pulmonary effort is normal.     Breath sounds: Normal breath sounds. No wheezing, rhonchi or rales.  Neurological:     Mental Status: He is alert.  Psychiatric:        Mood and Affect: Mood normal.        Behavior: Behavior normal.     Lab Results  Component Value Date   WBC 8.1 01/01/2020   HGB 13.3 01/01/2020   HCT 37.6 01/01/2020   PLT 293 01/01/2020   GLUCOSE 209 (H) 11/15/2020   CHOL 174 05/06/2020   TRIG 188 (H) 05/06/2020   HDL 38 (L) 05/06/2020   LDLCALC 103 (H) 05/06/2020   ALT 28 11/15/2020   AST 23 11/15/2020   NA 134 11/15/2020   K 5.7 (H) 11/15/2020   CL 98 11/15/2020   CREATININE 1.62 (H) 11/15/2020   BUN 26 11/15/2020   CO2 20 11/15/2020   PSA 1.92 03/15/2014   HGBA1C 8.1 (H) 11/15/2020   MICROALBUR 60.2 (H) 03/15/2014     Assessment & Plan:   Problem List Items Addressed This Visit       Cardiovascular and Mediastinum   Essential hypertension, benign    Stable.  Continue current medications.      Relevant Medications   hydrALAZINE (APRESOLINE) 25 MG tablet     Endocrine   Type 2 diabetes mellitus with diabetic nephropathy, without long-term current use of insulin (HCC) - Primary    Unsure of current status.  Continue glipizide.  A1c ordered.      Relevant Orders   CMP14+EGFR   Hemoglobin A1c     Nervous and Auditory   Meniere's disease    Stable.  Diazepam refilled.      Relevant Medications   diazepam (VALIUM) 2 MG tablet     Genitourinary   Stage 3b chronic kidney disease (HCC)   Relevant Orders    CBC     Other   Hyperlipidemia LDL goal <70    Continue Praluent.  Awaiting lipid panel results.      Relevant Medications   hydrALAZINE (APRESOLINE) 25 MG tablet   Other Relevant Orders     Lipid panel   Other Visit Diagnoses     Hypothyroidism, unspecified type       Relevant Orders   TSH       Meds ordered this encounter  Medications   diazepam (VALIUM) 2 MG tablet    Sig: TAKE ONE TABLET (2MG TOTAL) BY MOUTH DAILY AS NEEDED FOR DIZZINESS SECONDARY TO MENIERE'S DISEASE    Dispense:  90 tablet    Refill:  1    Follow-up:  No follow-ups on file.  Jayce Cook DO [facility]  

## 2021-08-12 LAB — CBC
Hematocrit: 38.4 % (ref 37.5–51.0)
Hemoglobin: 13.2 g/dL (ref 13.0–17.7)
MCH: 30.6 pg (ref 26.6–33.0)
MCHC: 34.4 g/dL (ref 31.5–35.7)
MCV: 89 fL (ref 79–97)
Platelets: 313 10*3/uL (ref 150–450)
RBC: 4.31 x10E6/uL (ref 4.14–5.80)
RDW: 13.6 % (ref 11.6–15.4)
WBC: 7 10*3/uL (ref 3.4–10.8)

## 2021-08-12 LAB — CMP14+EGFR
ALT: 19 IU/L (ref 0–44)
AST: 19 IU/L (ref 0–40)
Albumin/Globulin Ratio: 1.5 (ref 1.2–2.2)
Albumin: 4.2 g/dL (ref 3.8–4.8)
Alkaline Phosphatase: 104 IU/L (ref 44–121)
BUN/Creatinine Ratio: 14 (ref 10–24)
BUN: 24 mg/dL (ref 8–27)
Bilirubin Total: 0.4 mg/dL (ref 0.0–1.2)
CO2: 20 mmol/L (ref 20–29)
Calcium: 9.4 mg/dL (ref 8.6–10.2)
Chloride: 98 mmol/L (ref 96–106)
Creatinine, Ser: 1.73 mg/dL — ABNORMAL HIGH (ref 0.76–1.27)
Globulin, Total: 2.8 g/dL (ref 1.5–4.5)
Glucose: 140 mg/dL — ABNORMAL HIGH (ref 70–99)
Potassium: 5.2 mmol/L (ref 3.5–5.2)
Sodium: 132 mmol/L — ABNORMAL LOW (ref 134–144)
Total Protein: 7 g/dL (ref 6.0–8.5)
eGFR: 40 mL/min/{1.73_m2} — ABNORMAL LOW (ref >59)

## 2021-08-12 LAB — LIPID PANEL
Chol/HDL Ratio: 5.2 ratio — ABNORMAL HIGH (ref 0.0–5.0)
Cholesterol, Total: 203 mg/dL — ABNORMAL HIGH (ref 100–199)
HDL: 39 mg/dL — ABNORMAL LOW (ref >39)
LDL Chol Calc (NIH): 121 mg/dL — ABNORMAL HIGH (ref 0–99)
Triglycerides: 245 mg/dL — ABNORMAL HIGH (ref 0–149)
VLDL Cholesterol Cal: 43 mg/dL — ABNORMAL HIGH (ref 5–40)

## 2021-08-12 LAB — HEMOGLOBIN A1C
Est. average glucose Bld gHb Est-mCnc: 174 mg/dL
Hgb A1c MFr Bld: 7.7 % — ABNORMAL HIGH (ref 4.8–5.6)

## 2021-08-12 LAB — TSH: TSH: 2.06 u[IU]/mL (ref 0.450–4.500)

## 2021-08-22 ENCOUNTER — Ambulatory Visit (HOSPITAL_COMMUNITY)
Admission: RE | Admit: 2021-08-22 | Discharge: 2021-08-22 | Disposition: A | Discharge status: Home / Self Care | Payer: Medicare Other | Source: Ambulatory Visit | Attending: Cardiovascular Disease | Admitting: Cardiovascular Disease

## 2021-08-22 DIAGNOSIS — I6523 Occlusion and stenosis of bilateral carotid arteries: Secondary | ICD-10-CM | POA: Diagnosis present

## 2021-08-22 MED ORDER — IOHEXOL 350 MG/ML SOLN
75.0000 mL | Freq: Once | INTRAVENOUS | Status: AC | PRN
  Administered 2021-08-22: 75 mL via INTRAVENOUS

## 2021-08-28 ENCOUNTER — Ambulatory Visit: Payer: No Typology Code available for payment source | Admitting: Family Medicine

## 2021-09-01 ENCOUNTER — Encounter: Payer: Self-pay | Admitting: Family Medicine

## 2021-09-01 ENCOUNTER — Ambulatory Visit (INDEPENDENT_AMBULATORY_CARE_PROVIDER_SITE_OTHER): Payer: No Typology Code available for payment source | Admitting: Family Medicine

## 2021-09-01 VITALS — BP 158/67 | HR 66 | Temp 98.2°F | Wt 213.0 lb

## 2021-09-01 DIAGNOSIS — E1121 Type 2 diabetes mellitus with diabetic nephropathy: Secondary | ICD-10-CM

## 2021-09-01 MED ORDER — EMPAGLIFLOZIN 10 MG PO TABS: 10.0000 mg | ORAL_TABLET | Freq: Every day | ORAL | 0 refills | Status: DC

## 2021-09-01 NOTE — Patient Instructions (Addendum)
Lab in 2 weeks to 1 month.  Follow up in 3 months.  Please notify nephrology of the addition of Jardiance. Most recent GFR 40.   Take care  Dr. Lacinda Axon

## 2021-09-04 NOTE — Assessment & Plan Note (Signed)
Not at goal.  Restarting Jardiance.  Follow-up metabolic panel in 1 month.  Follow-up with me in 3 months.

## 2021-09-04 NOTE — Progress Notes (Signed)
Subjective:  Patient ID: Kyle Dunlap, male    DOB: 03/19/1945  Age: 76 y.o. MRN: [id number]  CC: Chief Complaint  Patient presents with   Diabetes    Pt here to discuss diabetic med. Nephrology did not want him take Jardiance due to kidney issues.     HPI:  76 year old male with hypertension, type 2 diabetes with nephropathy, Mnire's disease, hyperlipidemia with prior statin myopathy presents for evaluation of the above.  Patient states that he is doing well.  Has no complaints or concerns at this time other than discussing his diabetes.  Most recent A1c 7.7.  He is here to discuss treatment options to get him at goal.  He was previously on Jardiance.  It had been stopped due to mild AKI and has not been restarted.  We will discuss this today.  He will need close monitoring regarding his renal function.   Patient Active Problem List   Diagnosis Date Noted   Type 2 diabetes mellitus with diabetic nephropathy, without long-term current use of insulin ([place]) 11/15/2020   Stage 3b chronic kidney disease ([place]) 11/15/2020   Chronic right-sided low back pain without sciatica 11/15/2020   Diabetic neuropathy ([place]) 11/15/2020   Statin myopathy 05/09/2020   Aortic atherosclerosis ([place]) 05/06/2020   Meniere's disease 03/20/2015   Hyperlipidemia LDL goal <70 07/15/2012   Essential hypertension, benign 07/15/2012   GERD (gastroesophageal reflux disease) 08/03/2011   H/O adenomatous polyp of colon 08/03/2011    Social Hx   Social History   Socioeconomic History   Marital status: Married    Spouse name: Not on file   Number of children: Not on file   Years of education: Not on file   Highest education level: Not on file  Occupational History   Not on file  Tobacco Use   Smoking status: Former   Smokeless tobacco: Former    Quit date: 07/14/1985  Vaping Use   Vaping Use: Never used  Substance and Sexual Activity   Alcohol use: Yes    Comment: occasionally- 3-4 mixed  drinks per week.   Drug use: No   Sexual activity: Yes    Birth control/protection: None  Other Topics Concern   Not on file  Social History Narrative   Not on file   Social Determinants of Health   Financial Resource Strain: Low Risk  (09/20/2020)   Overall Financial Resource Strain (CARDIA)    Difficulty of Paying Living Expenses: Not hard at all  Food Insecurity: No Food Insecurity (09/20/2020)   Hunger Vital Sign    Worried About Running Out of Food in the Last Year: Never true    [place] in the Last Year: Never true  Transportation Needs: No Transportation Needs (09/20/2020)   PRAPARE - Hydrologist (Medical): No    Lack of Transportation (Non-Medical): No  Physical Activity: Insufficiently Active (09/20/2020)   Exercise Vital Sign    Days of Exercise per Week: 1 day    Minutes of Exercise per Session: 10 min  Stress: No Stress Concern Present (09/20/2020)   [place]    Feeling of Stress : Not at all  Social Connections: [place] (09/20/2020)   Social Connection and Isolation Panel [NHANES]    Frequency of Communication with Friends and Family: Twice a week    Frequency of Social Gatherings with Friends and Family: Once a week    Attends Religious  Services: More than 4 times per year    Active Member of Clubs or Organizations: Yes    Attends Archivist Meetings: 1 to 4 times per year    Marital Status: Married    Review of Systems  Respiratory: Negative.    Cardiovascular: Negative.    Objective:  BP (!) 158/67   Pulse 66   Temp 98.2 F (36.8 C)   Wt 213 lb (96.6 kg)   SpO2 99%   BMI 31.45 kg/m      09/01/2021    1:54 PM 08/09/2021    2:35 PM 08/09/2021    2:08 PM  BP/Weight  Systolic BP 128 786 767  Diastolic BP 67 70 73  Wt. (Lbs) 213  211.6  BMI 31.45 kg/m2  31.25 kg/m2    Physical Exam Constitutional:      General: He is not in  acute distress.    Appearance: Normal appearance.  HENT:     Head: Normocephalic and atraumatic.  Eyes:     General:        Right eye: No discharge.        Left eye: No discharge.     Conjunctiva/sclera: Conjunctivae normal.  Cardiovascular:     Rate and Rhythm: Normal rate and regular rhythm.  Pulmonary:     Effort: Pulmonary effort is normal.     Breath sounds: Normal breath sounds. No wheezing, rhonchi or rales.  Neurological:     Mental Status: He is alert.  Psychiatric:        Mood and Affect: Mood normal.        Behavior: Behavior normal.     Lab Results  Component Value Date   WBC 7.0 08/11/2021   HGB 13.2 08/11/2021   HCT 38.4 08/11/2021   PLT 313 08/11/2021   GLUCOSE 140 (H) 08/11/2021   CHOL 203 (H) 08/11/2021   TRIG 245 (H) 08/11/2021   HDL 39 (L) 08/11/2021   LDLCALC 121 (H) 08/11/2021   ALT 19 08/11/2021   AST 19 08/11/2021   NA 132 (L) 08/11/2021   K 5.2 08/11/2021   CL 98 08/11/2021   CREATININE 1.73 (H) 08/11/2021   BUN 24 08/11/2021   CO2 20 08/11/2021   TSH 2.060 08/11/2021   PSA 1.92 03/15/2014   HGBA1C 7.7 (H) 08/11/2021   MICROALBUR 60.2 (H) 03/15/2014     Assessment & Plan:   Problem List Items Addressed This Visit       Endocrine   Type 2 diabetes mellitus with diabetic nephropathy, without long-term current use of insulin ([place]) - Primary    Not at goal.  Restarting Jardiance.  Follow-up metabolic panel in 1 month.  Follow-up with me in 3 months.      Relevant Medications   empagliflozin (JARDIANCE) 10 MG TABS tablet   Other Relevant Orders   Basic Metabolic Panel    Meds ordered this encounter  Medications   empagliflozin (JARDIANCE) 10 MG TABS tablet    Sig: Take 1 tablet (10 mg total) by mouth daily before breakfast.    Dispense:  90 tablet    Refill:  0    Follow-up:  Return in about 3 months (around 12/02/2021).  [place]

## 2021-09-07 ENCOUNTER — Telehealth: Payer: Self-pay

## 2021-09-07 DIAGNOSIS — I6523 Occlusion and stenosis of bilateral carotid arteries: Secondary | ICD-10-CM

## 2021-09-07 NOTE — Telephone Encounter (Signed)
-----   Message from Josue Hector, MD sent at 09/04/2021  2:20 PM EDT ----- Tight left ICA confirmed needs referral to [facility] for possible CEA

## 2021-09-07 NOTE — Telephone Encounter (Signed)
Referral placed to [facility]   Left message to return call

## 2021-09-14 LAB — HM DIABETES EYE EXAM

## 2021-09-15 NOTE — Telephone Encounter (Signed)
Pt.notified

## 2021-09-20 ENCOUNTER — Ambulatory Visit (INDEPENDENT_AMBULATORY_CARE_PROVIDER_SITE_OTHER): Payer: Medicare Other | Admitting: Vascular Surgery

## 2021-09-20 ENCOUNTER — Encounter: Payer: Self-pay | Admitting: Vascular Surgery

## 2021-09-20 VITALS — BP 171/67 | HR 72 | Temp 98.4°F | Ht 69.0 in | Wt 214.0 lb

## 2021-09-20 DIAGNOSIS — I6522 Occlusion and stenosis of left carotid artery: Secondary | ICD-10-CM | POA: Diagnosis not present

## 2021-09-20 NOTE — Progress Notes (Signed)
[facility] [place] [facility]  Patient name: Kyle Dunlap MRN: [id number] DOB: Nov 13, 1945 Sex: male  REASON FOR CONSULT: Evaluation critical left internal carotid artery stenosis  HPI: Kyle Dunlap is a 76 y.o. male, who is here today for evaluation of critical asymptomatic carotid stenosis.  He had had a prior ultrasound suggesting moderate disease and recently had follow-up duplex suggesting progression to critical stenosis in his left internal carotid artery.  He is right-handed.  He specifically denies any history of aphasia, amaurosis, TIA or stroke.  He underwent follow-up CT scan and is here today for discussion of this.  His past medical history as listed below.  He has no history of coronary artery disease.  Past Medical History:  Diagnosis Date   Adenomatous colon polyp 11/22/2008   Dr. Gala Romney   Allergy    Chronic back pain    DDD (degenerative disc disease), lumbar    Hemorrhoids, external    High cholesterol    HTN (hypertension)    Hyperplastic colon polyp 11/22/2008   Dr. Gala Romney   Kidney stone    "14 years ago" per pt   Meniere's disease    PTSD (post-traumatic stress disorder)    Tachyarrhythmia    Type 2 diabetes mellitus ([place])     Family History  Problem Relation Age of Onset   Heart attack Father    Colon cancer Brother 26    SOCIAL HISTORY: Social History   Socioeconomic History   Marital status: Married    Spouse name: Not on file   Number of children: Not on file   Years of education: Not on file   Highest education level: Not on file  Occupational History   Not on file  Tobacco Use   Smoking status: Former   Smokeless tobacco: Former    Quit date: 07/14/1985  Vaping Use   Vaping Use: Never used  Substance and Sexual Activity   Alcohol use: Yes    Comment: occasionally- 3-4 mixed drinks per week.   Drug use: No   Sexual activity: Yes    Birth control/protection: None  Other Topics  Concern   Not on file  Social History Narrative   Not on file   Social Determinants of Health   Financial Resource Strain: Low Risk  (09/20/2020)   Overall Financial Resource Strain (CARDIA)    Difficulty of Paying Living Expenses: Not hard at all  Food Insecurity: No Food Insecurity (09/20/2020)   Hunger Vital Sign    Worried About Running Out of Food in the Last Year: Never true    [place] in the Last Year: Never true  Transportation Needs: No Transportation Needs (09/20/2020)   PRAPARE - Hydrologist (Medical): No    Lack of Transportation (Non-Medical): No  Physical Activity: Insufficiently Active (09/20/2020)   Exercise Vital Sign    Days of Exercise per Week: 1 day    Minutes of Exercise per Session: 10 min  Stress: No Stress Concern Present (09/20/2020)   [place]    Feeling of Stress : Not at all  Social Connections: [place] (09/20/2020)   Social Connection and Isolation Panel [NHANES]    Frequency of Communication with Friends and Family: Twice a week    Frequency of Social Gatherings with Friends and Family: Once a week    Attends Religious Services: More than 4 times per year  Active Member of Clubs or Organizations: Yes    Attends Archivist Meetings: 1 to 4 times per year    Marital Status: Married  Human resources officer Violence: Not At Risk (09/20/2020)   Humiliation, Afraid, Rape, and Kick questionnaire    Fear of Current or Ex-Partner: No    Emotionally Abused: No    Physically Abused: No    Sexually Abused: No    Allergies  Allergen Reactions   Atorvastatin Other (See Comments)    Joint and muscle aches, weakness present   Zetia [Ezetimibe] Other (See Comments)    Elevated liver enzymes   Zocor [Simvastatin] Other (See Comments)    Joint and muscle aches   Codeine Other (See Comments)    Passed out    Current Outpatient Medications   Medication Sig Dispense Refill   acetaminophen (TYLENOL) 325 MG tablet Take 325 mg by mouth every 6 (six) hours as needed for moderate pain.     albuterol (VENTOLIN HFA) 108 (90 Base) MCG/ACT inhaler TAKE 2 PUFFS BY MOUTH EVERY 6 HOURS AS NEEDED FOR WHEEZE OR SHORTNESS OF BREATH 18 g 6   Ascorbic Acid (VITAMIN C) 1000 MG tablet Take 1,000 mg by mouth daily.     aspirin 81 MG tablet Take 81 mg by mouth daily.     diazepam (VALIUM) 2 MG tablet TAKE ONE TABLET ('2MG'$  TOTAL) BY MOUTH DAILY AS NEEDED FOR DIZZINESS SECONDARY TO MENIERE'S DISEASE 90 tablet 1   DULoxetine (CYMBALTA) 60 MG capsule Take 1 capsule (60 mg total) by mouth daily. 90 capsule 1   empagliflozin (JARDIANCE) 10 MG TABS tablet Take 1 tablet (10 mg total) by mouth daily before breakfast. 90 tablet 0   enalapril (VASOTEC) 20 MG tablet TAKE 1 TABLET BY MOUTH EVERY DAY 90 tablet 1   glipiZIDE (GLUCOTROL) 5 MG tablet TAKE 2 TABLETS BY MOUTH TWICE A DAY 360 tablet 1   hydrALAZINE (APRESOLINE) 25 MG tablet Take 25 mg by mouth 2 (two) times daily.     levothyroxine (SYNTHROID) 25 MCG tablet TAKE ONE TABLET BY MOUTH DAILY FOR HYPOTHYROIDISM     Multiple Vitamin (MULTIVITAMIN WITH MINERALS) TABS Take 1 tablet by mouth daily.     pantoprazole (PROTONIX) 40 MG tablet TAKE 1 TABLET BY MOUTH EVERY DAY 90 tablet 1   PRALUENT 150 MG/ML SOAJ INJECT 1 PEN INTO THE SKIN EVERY 14 (FOURTEEN) DAYS. 6 mL 3   sodium chloride (OCEAN) 0.65 % SOLN nasal spray Place 1 spray into both nostrils at bedtime.     TIADYLT ER 360 MG 24 hr capsule TAKE 1 CAPSULE BY MOUTH EVERY DAY 90 capsule 1   No current facility-administered medications for this visit.    REVIEW OF SYSTEMS:  '[X]'$  denotes positive finding, '[ ]'$  denotes negative finding Cardiac  Comments:  Chest pain or chest pressure:    Shortness of breath upon exertion:    Short of breath when lying flat:    Irregular heart rhythm: x       Vascular    Pain in calf, thigh, or hip brought on by ambulation: x    Pain in feet at night that wakes you up from your sleep:     Blood clot in your veins:    Leg swelling:         Pulmonary    Oxygen at home:    Productive cough:     Wheezing:         Neurologic    Sudden weakness  in arms or legs:     Sudden numbness in arms or legs:     Sudden onset of difficulty speaking or slurred speech:    Temporary loss of vision in one eye:     Problems with dizziness:         Gastrointestinal    Blood in stool:     Vomited blood:         Genitourinary    Burning when urinating:     Blood in urine:        Psychiatric    Major depression:         Hematologic    Bleeding problems:    Problems with blood clotting too easily:        Skin    Rashes or ulcers:        Constitutional    Fever or chills:      PHYSICAL EXAM: Vitals:   09/20/21 1305 09/20/21 1309  BP: (!) 198/62 (!) 171/67  Pulse: 72   Temp: 98.4 F (36.9 C)   TempSrc: Temporal   SpO2: 96%   Weight: 214 lb (97.1 kg)   Height: '5\' 9"'$  (1.753 m)     GENERAL: The patient is a well-nourished male, in no acute distress. The vital signs are documented above. CARDIOVASCULAR: I do not appreciate carotid bruits in his right or left neck.  2+ radial pulses bilaterally PULMONARY: There is good air exchange  MUSCULOSKELETAL: There are no major deformities or cyanosis. NEUROLOGIC: No focal weakness or paresthesias are detected. SKIN: There are no ulcers or rashes noted. PSYCHIATRIC: The patient has a normal affect.  DATA:  I reviewed his CT images with the patient and his wife present.  This does show critical stenosis in his left internal carotid artery at the bifurcation with no significant disease above in the extracranial internal carotid artery  MEDICAL ISSUES: Had a long discussion with the patient and his wife regarding his severe asymptomatic disease.  This puts him at approximately 5 %/year risk of neurologic event.  I have recommended endarterectomy for reduction of stroke  risk.  I explained the procedure in detail and also a 1-1 and half percent risk of stroke with surgery and also the unusual complication of cranial nerve injury.  Wish to proceed with surgery.  He is planning a beach trip in the mid October and I explained that from a statistical standpoint it would certainly be safe to wait until he returns from his trip and then have his elective left carotid endarterectomy.  Discussed this case with Dr. Melene Muller and the patient and his wife understand that Dr. Virl Cagey will be doing his surgery   Rosetta Posner, MD [facility] [phone] Pager [phone]  Note: Portions of this report may have been transcribed using voice recognition software.  Every effort has been made to ensure accuracy; however, inadvertent computerized transcription errors may still be present.

## 2021-09-22 ENCOUNTER — Other Ambulatory Visit: Payer: Self-pay

## 2021-09-22 DIAGNOSIS — I6522 Occlusion and stenosis of left carotid artery: Secondary | ICD-10-CM

## 2021-10-06 LAB — BASIC METABOLIC PANEL
BUN/Creatinine Ratio: 16 (ref 10–24)
BUN: 21 mg/dL (ref 8–27)
CO2: 21 mmol/L (ref 20–29)
Calcium: 8.9 mg/dL (ref 8.6–10.2)
Chloride: 101 mmol/L (ref 96–106)
Creatinine, Ser: 1.35 mg/dL — ABNORMAL HIGH (ref 0.76–1.27)
Glucose: 144 mg/dL — ABNORMAL HIGH (ref 70–99)
Potassium: 4.5 mmol/L (ref 3.5–5.2)
Sodium: 138 mmol/L (ref 134–144)
eGFR: 54 mL/min/{1.73_m2} — ABNORMAL LOW (ref >59)

## 2021-10-14 ENCOUNTER — Ambulatory Visit
Admission: RE | Admit: 2021-10-14 | Discharge: 2021-10-14 | Disposition: A | Discharge status: Home / Self Care | Payer: Non-veteran care | Source: Ambulatory Visit | Attending: Family Medicine | Admitting: Family Medicine

## 2021-10-14 ENCOUNTER — Ambulatory Visit (INDEPENDENT_AMBULATORY_CARE_PROVIDER_SITE_OTHER): Payer: No Typology Code available for payment source

## 2021-10-14 ENCOUNTER — Other Ambulatory Visit: Payer: Self-pay

## 2021-10-14 VITALS — BP 162/71 | HR 58 | Temp 98.4°F | Resp 20

## 2021-10-14 DIAGNOSIS — M79642 Pain in left hand: Secondary | ICD-10-CM | POA: Diagnosis not present

## 2021-10-14 DIAGNOSIS — S60222A Contusion of left hand, initial encounter: Secondary | ICD-10-CM

## 2021-10-14 NOTE — ED Provider Notes (Signed)
[facility]    CSN: [id number] Arrival date & time: 10/14/21  1036      History   Chief Complaint Chief Complaint  Patient presents with   Hand Problem    [place]. And hurt hand - Entered by patient    HPI Kyle Dunlap is a 76 y.o. male.   Patient here today with 1 week history of left dorsal hand swelling, tenderness after falling and having a weedeater pin the hand down.  He states the pain is at the knuckles of the second and third digit.  No decreased range of motion, numbness, tingling, significant discoloration, weakness in the hand.  Not trying anything over-the-counter for symptoms.    Past Medical History:  Diagnosis Date   Adenomatous colon polyp 11/22/2008   Dr. Gala Romney   Allergy    Chronic back pain    DDD (degenerative disc disease), lumbar    Hemorrhoids, external    High cholesterol    HTN (hypertension)    Hyperplastic colon polyp 11/22/2008   Dr. Gala Romney   Kidney stone    "14 years ago" per pt   Meniere's disease    PTSD (post-traumatic stress disorder)    Tachyarrhythmia    Type 2 diabetes mellitus ([place])     Patient Active Problem List   Diagnosis Date Noted   Type 2 diabetes mellitus with diabetic nephropathy, without long-term current use of insulin ([place]) 11/15/2020   Stage 3b chronic kidney disease ([place]) 11/15/2020   Chronic right-sided low back pain without sciatica 11/15/2020   Diabetic neuropathy ([place]) 11/15/2020   Statin myopathy 05/09/2020   Aortic atherosclerosis ([place]) 05/06/2020   Meniere's disease 03/20/2015   Hyperlipidemia LDL goal <70 07/15/2012   Essential hypertension, benign 07/15/2012   GERD (gastroesophageal reflux disease) 08/03/2011   H/O adenomatous polyp of colon 08/03/2011    Past Surgical History:  Procedure Laterality Date   CATARACT EXTRACTION W/PHACO Left 09/28/2016   Procedure: CATARACT EXTRACTION PHACO AND INTRAOCULAR LENS PLACEMENT ([place]);  Surgeon: Baruch Goldmann, MD;  Location: [facility];  Service:  Ophthalmology;  Laterality: Left;  CDE: 4.40   CATARACT EXTRACTION W/PHACO Right 10/26/2016   Procedure: CATARACT EXTRACTION PHACO AND INTRAOCULAR LENS PLACEMENT RIGHT EYE;  Surgeon: Baruch Goldmann, MD;  Location: [facility];  Service: Ophthalmology;  Laterality: Right;  CDE: 6.74   CHOLECYSTECTOMY     COLONOSCOPY  12/10/2008   Dr. Gala Romney- L side diverticula, adenomatous polyp, hyperplastic polyp   COLONOSCOPY  11/16/2003   Dr. Laural Golden- performed exam to cecum,3 polyps- no path report available, external hemorrhoids,    COLONOSCOPY N/A 01/04/2014   Procedure: COLONOSCOPY;  Surgeon: Daneil Dolin, MD;  Location: [facility];  Service: Endoscopy;  Laterality: N/A;  8:30am   COLONOSCOPY N/A 02/20/2017   Surgeon: Daneil Dolin, MD; multiple colon polyps ranging 3 to 9 mm in size.  Pathology with tubular adenomas.  Recommended repeat in 3 years.   COLONOSCOPY WITH PROPOFOL N/A 10/03/2020   Procedure: COLONOSCOPY WITH PROPOFOL;  Surgeon: Daneil Dolin, MD;  Location: [facility];  Service: Endoscopy;  Laterality: N/A;  8:30am   ESOPHAGOGASTRODUODENOSCOPY  08/31/2011   [id number] esophageal erosions consistent with mild erosive reflux esophagitis   MOUTH SURGERY     POLYPECTOMY  10/03/2020   Procedure: POLYPECTOMY;  Surgeon: Daneil Dolin, MD;  Location: [facility];  Service: Endoscopy;;       Home Medications    Prior to Admission medications   Medication Sig Start Date  End Date Taking? Authorizing Provider  acetaminophen (TYLENOL) 325 MG tablet Take 325 mg by mouth every 6 (six) hours as needed for moderate pain.    [provider]  albuterol (VENTOLIN HFA) 108 (90 Base) MCG/ACT inhaler TAKE 2 PUFFS BY MOUTH EVERY 6 HOURS AS NEEDED FOR WHEEZE OR SHORTNESS OF BREATH 01/29/19   Mikey Kirschner, MD  Ascorbic Acid (VITAMIN C) 1000 MG tablet Take 1,000 mg by mouth daily.    [provider]  aspirin 81 MG tablet Take 81 mg by mouth daily.    [provider]   diazepam (VALIUM) 2 MG tablet TAKE ONE TABLET ('2MG'$  TOTAL) BY MOUTH DAILY AS NEEDED FOR DIZZINESS SECONDARY TO MENIERE'S DISEASE 08/09/21   Coral Spikes, DO  DULoxetine (CYMBALTA) 60 MG capsule Take 1 capsule (60 mg total) by mouth daily. 05/11/21   Coral Spikes, DO  empagliflozin (JARDIANCE) 10 MG TABS tablet Take 1 tablet (10 mg total) by mouth daily before breakfast. 09/01/21   Thersa Salt G, DO  enalapril (VASOTEC) 20 MG tablet TAKE 1 TABLET BY MOUTH EVERY DAY 05/25/21   Cook, [place] G, DO  glipiZIDE (GLUCOTROL) 5 MG tablet TAKE 2 TABLETS BY MOUTH TWICE A DAY 05/25/21   Cook, Michael [place] G, DO  hydrALAZINE (APRESOLINE) 25 MG tablet Take 25 mg by mouth 2 (two) times daily.    [provider]  levothyroxine (SYNTHROID) 25 MCG tablet TAKE ONE TABLET BY MOUTH DAILY FOR HYPOTHYROIDISM 04/12/21   [provider]  Multiple Vitamin (MULTIVITAMIN WITH MINERALS) TABS Take 1 tablet by mouth daily.    [provider]  pantoprazole (PROTONIX) 40 MG tablet TAKE 1 TABLET BY MOUTH EVERY DAY 12/30/20   Cook, Jayce G, DO  PRALUENT 150 MG/ML SOAJ INJECT 1 PEN INTO THE SKIN EVERY 14 (FOURTEEN) DAYS. 01/30/21   Josue Hector, MD  sodium chloride (OCEAN) 0.65 % SOLN nasal spray Place 1 spray into both nostrils at bedtime.    [provider]  TIADYLT ER 360 MG 24 hr capsule TAKE 1 CAPSULE BY MOUTH EVERY DAY 01/02/21   Coral Spikes, DO    Family History Family History  Problem Relation Age of Onset   Heart attack Father    Colon cancer Brother 64    Social History Social History   Tobacco Use   Smoking status: Former   Smokeless tobacco: Former    Quit date: 07/14/1985  Vaping Use   Vaping Use: Never used  Substance Use Topics   Alcohol use: Yes    Comment: occasionally- 3-4 mixed drinks per week.   Drug use: No     Allergies   Atorvastatin, Zetia [ezetimibe], Zocor [simvastatin], and Codeine   Review of Systems Review of Systems Per HPI  Physical Exam Triage Vital  Signs ED Triage Vitals  Enc Vitals Group     BP 10/14/21 1045 (!) 162/71     Pulse Rate 10/14/21 1045 (!) 58     Resp 10/14/21 1045 20     Temp 10/14/21 1045 98.4 F (36.9 C)     Temp Source 10/14/21 1045 Oral     SpO2 10/14/21 1045 95 %     Weight --      Height --      Head Circumference --      Peak Flow --      Pain Score 10/14/21 1046 0     Pain Loc --      Pain Edu? --  Excl. in GC? --    No data found.  Updated Vital Signs BP (!) 162/71 (BP Location: Right Arm)   Pulse (!) 58   Temp 98.4 F (36.9 C) (Oral)   Resp 20   SpO2 95%   Visual Acuity Right Eye Distance:   Left Eye Distance:   Bilateral Distance:    Right Eye Near:   Left Eye Near:    Bilateral Near:     Physical Exam Vitals and nursing note reviewed.  Constitutional:      Appearance: Normal appearance.  HENT:     Head: Atraumatic.  Eyes:     Extraocular Movements: Extraocular movements intact.     Conjunctiva/sclera: Conjunctivae normal.  Cardiovascular:     Rate and Rhythm: Normal rate and regular rhythm.  Pulmonary:     Effort: Pulmonary effort is normal.     Breath sounds: Normal breath sounds.  Musculoskeletal:        General: Swelling, tenderness and signs of injury present. No deformity. Normal range of motion.     Cervical back: Normal range of motion and neck supple.     Comments: Mild swelling to the second and third MCP left hand and slightly into these 2 fingers.  No bony deformity palpable, minimal tenderness to palpation, range of motion intact  Skin:    General: Skin is warm and dry.     Findings: No bruising or erythema.  Neurological:     General: No focal deficit present.     Mental Status: He is oriented to person, place, and time.     Comments: Left hand neurovascularly intact  Psychiatric:        Mood and Affect: Mood normal.        Thought Content: Thought content normal.        Judgment: Judgment normal.      UC Treatments / Results  Labs (all labs  ordered are listed, but only abnormal results are displayed) Labs Reviewed - No data to display  EKG   Radiology DG Hand Complete Left  Result Date: 10/14/2021 CLINICAL DATA:  Left hand injury. EXAM: LEFT HAND - COMPLETE 3+ VIEW COMPARISON:  None Available. FINDINGS: Mild soft tissue swelling is present over the dorsum of the left hand. No underlying fracture or foreign body is present. Osteo arthritic degenerative changes are noted at the first [facility] joint and IP joint of the thumb. IMPRESSION: Mild soft tissue swelling over the dorsum of the hand without underlying fracture or foreign body. Electronically Signed   By: San Morelle M.D.   On: 10/14/2021 11:13    Procedures Procedures (including critical care time)  Medications Ordered in UC Medications - No data to display  Initial Impression / Assessment and Plan / UC Course  I have reviewed the triage vital signs and the nursing notes.  Pertinent labs & imaging results that were available during my care of the patient were reviewed by me and considered in my medical decision making (see chart for details).     X-ray of the left hand negative for acute bony abnormality, discussed ice, elevation, Epsom salt soaks, over-the-counter pain relievers.  Return for worsening symptoms.  Final Clinical Impressions(s) / UC Diagnoses   Final diagnoses:  Contusion of left hand, initial encounter   Discharge Instructions   None    ED Prescriptions   None    PDMP not reviewed this encounter.   Volney American, [place] 10/14/21 1133

## 2021-10-14 NOTE — ED Triage Notes (Signed)
Pt reports tripped over a log while using weed eater and reports left hand was pinned between weed eater and a tree limb x1 week ago. Mild swelling noted. Reports takes daily baby aspirin.

## 2021-10-17 ENCOUNTER — Telehealth: Payer: Self-pay | Admitting: Family Medicine

## 2021-10-17 NOTE — Telephone Encounter (Signed)
Left a message for patient to call back and schedule Medicare Annual Wellness Visit (AWV)    Please offer to do virtually or by telephone.  Last AWV: 09/20/2020  Please schedule at any time with [facility] Advisor.  30 minute appointment for Virtual or phone  45 minute appointment for an Initial virtual/phone  Any questions, please contact me at [phone]

## 2021-10-20 NOTE — Progress Notes (Signed)
CARDIOLOGY CONSULT NOTE       Patient ID: Kyle Dunlap MRN: [id number] DOB/AGE: 1945/04/13 76 y.o.  Admit date: (Not on file) Referring Physician: Lovena Le Primary Physician: Coral Spikes, DO Primary Cardiologist: Johnsie Cancel Reason for Consultation: HLD  Active Problems:   * No active hospital problems. *   HPI:  76 y.o. referred by dr Lovena Le 03/11/20 for HLD in setting of statin intolerance History of HTN, DM, family history of CAD Father having MI Notes joint/muscle pains with lipitor and zocor and elevated LFTls with zetia . 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 Gala Romney   Allergy    Chronic back pain    DDD (degenerative disc disease), lumbar    Hemorrhoids, external    High cholesterol    HTN (hypertension)    Hyperplastic colon polyp 11/22/2008   Dr. Gala Romney   Kidney stone    "14 years ago" per pt   Meniere's disease    PTSD (post-traumatic stress disorder)    Tachyarrhythmia    Type 2 diabetes mellitus ([place])     Family History  Problem Relation Age of Onset   Heart attack Father    Colon cancer Brother 75    Social History   Socioeconomic History   Marital status: Married     Spouse name: Not on file   Number of children: Not on file   Years of education: Not on file   Highest education level: Not on file  Occupational History   Not on file  Tobacco Use   Smoking status: Former   Smokeless tobacco: Former    Quit date: 07/14/1985  Vaping Use   Vaping Use: Never used  Substance and Sexual Activity   Alcohol use: Yes    Comment: occasionally- 3-4 mixed drinks per week.   Drug use: No   Sexual activity: Yes    Birth control/protection: None  Other Topics Concern   Not on file  Social History Narrative   Not on file   Social Determinants of Health   Financial Resource Strain: Low Risk  (10/26/2021)   Overall Financial Resource Strain (CARDIA)    Difficulty of Paying Living Expenses: Not hard at all  Food Insecurity: No Food Insecurity (10/26/2021)   Hunger Vital Sign    Worried About Running Out of Food in the Last Year: Never true    Ran Out of Food in the Last Year: Never true  Transportation Needs: No Transportation Needs (10/26/2021)   PRAPARE - Hydrologist (Medical): No    Lack of Transportation (Non-Medical): No  Physical Activity: Inactive (10/26/2021)  Exercise Vital Sign    Days of Exercise per Week: 0 days    Minutes of Exercise per Session: 0 min  Stress: No Stress Concern Present (10/26/2021)   [place]    Feeling of Stress : Not at all  Social Connections: Moderately Integrated (10/26/2021)   Social Connection and Isolation Panel [NHANES]    Frequency of Communication with Friends and Family: Twice a week    Frequency of Social Gatherings with Friends and Family: Twice a week    Attends Religious Services: More than 4 times per year    Active Member of [employer] or Organizations: No    Attends Archivist Meetings: Never    Marital Status: Married  Human resources officer Violence: Not At Risk (10/26/2021)   Humiliation, Afraid, Rape,  and Kick questionnaire    Fear of Current or Ex-Partner: No    Emotionally Abused: No    Physically Abused: No    Sexually Abused: No    Past Surgical History:  Procedure Laterality Date   CATARACT EXTRACTION W/PHACO Left 09/28/2016   Procedure: CATARACT EXTRACTION PHACO AND INTRAOCULAR LENS PLACEMENT ([place]);  Surgeon: Baruch Goldmann, MD;  Location: [facility];  Service: Ophthalmology;  Laterality: Left;  CDE: 4.40   CATARACT EXTRACTION W/PHACO Right 10/26/2016   Procedure: CATARACT EXTRACTION PHACO AND INTRAOCULAR LENS PLACEMENT RIGHT EYE;  Surgeon: Baruch Goldmann, MD;  Location: [facility];  Service: Ophthalmology;  Laterality: Right;  CDE: 6.74   CHOLECYSTECTOMY     COLONOSCOPY  12/10/2008   Dr. Gala Romney- L side diverticula, adenomatous polyp, hyperplastic polyp   COLONOSCOPY  11/16/2003   Dr. Laural Golden- performed exam to cecum,3 polyps- no path report available, external hemorrhoids,    COLONOSCOPY N/A 01/04/2014   Procedure: COLONOSCOPY;  Surgeon: Daneil Dolin, MD;  Location: [facility];  Service: Endoscopy;  Laterality: N/A;  8:30am   COLONOSCOPY N/A 02/20/2017   Surgeon: Daneil Dolin, MD; multiple colon polyps ranging 3 to 9 mm in size.  Pathology with tubular adenomas.  Recommended repeat in 3 years.   COLONOSCOPY WITH PROPOFOL N/A 10/03/2020   Procedure: COLONOSCOPY WITH PROPOFOL;  Surgeon: Daneil Dolin, MD;  [facility]: [facility];  Service: Endoscopy;  Laterality: N/A;  8:30am   ESOPHAGOGASTRODUODENOSCOPY  08/31/2011   JZP:[id number] esophageal erosions consistent with mild erosive reflux esophagitis   MOUTH SURGERY     POLYPECTOMY  10/03/2020   Procedure: POLYPECTOMY;  Surgeon: Daneil Dolin, MD;  Location: [facility];  Service: Endoscopy;;      Current Outpatient Medications:    acetaminophen (TYLENOL) 325 MG tablet, Take 325 mg by mouth every 6 (six) hours as needed for moderate pain., Disp: , Rfl:    albuterol (VENTOLIN HFA) 108 (90 Base) MCG/ACT inhaler, TAKE 2 PUFFS BY  MOUTH EVERY 6 HOURS AS NEEDED FOR WHEEZE OR SHORTNESS OF BREATH, Disp: 18 g, Rfl: 6   Ascorbic Acid (VITAMIN C) 1000 MG tablet, Take 1,000 mg by mouth daily., Disp: , Rfl:    aspirin 81 MG tablet, Take 81 mg by mouth daily., Disp: , Rfl:    diazepam (VALIUM) 2 MG tablet, TAKE ONE TABLET ('2MG'$  TOTAL) BY MOUTH DAILY AS NEEDED FOR DIZZINESS SECONDARY TO MENIERE'S DISEASE, Disp: 90 tablet, Rfl: 1   diclofenac Sodium (VOLTAREN) 1 % GEL, APPLY 4 GRAMS TO AFFECTED AREA THREE TIMES A DAY AS NEEDED FOR ARTHRITIS PAIN, Disp: , Rfl:    DULoxetine (CYMBALTA) 60 MG capsule, Take  1 capsule (60 mg total) by mouth daily., Disp: 90 capsule, Rfl: 1   empagliflozin (JARDIANCE) 10 MG TABS tablet, Take 1 tablet (10 mg total) by mouth daily before breakfast., Disp: 90 tablet, Rfl: 0   enalapril (VASOTEC) 20 MG tablet, TAKE 1 TABLET BY MOUTH EVERY DAY, Disp: 90 tablet, Rfl: 1   glipiZIDE (GLUCOTROL) 10 MG tablet, TAKE ONE TABLET BY MOUTH TWICE A DAY FOR DIABETES (TAKE THIS MEDICATION 30 MINUTES PRIOR TO A MEAL), Disp: , Rfl:    glipiZIDE (GLUCOTROL) 5 MG tablet, TAKE 2 TABLETS BY MOUTH TWICE A DAY, Disp: 360 tablet, Rfl: 1   hydrALAZINE (APRESOLINE) 50 MG tablet, Take 1 tablet by mouth 2 (two) times daily., Disp: , Rfl:    levothyroxine (SYNTHROID) 25 MCG tablet, TAKE ONE TABLET BY MOUTH DAILY FOR HYPOTHYROIDISM, Disp: , Rfl:    levothyroxine (SYNTHROID) 50 MCG tablet, TAKE ONE TABLET BY MOUTH DAILY FOR HYPOTHYROIDISM, Disp: , Rfl:    Multiple Vitamin (MULTIVITAMIN WITH MINERALS) TABS, Take 1 tablet by mouth daily., Disp: , Rfl:    pantoprazole (PROTONIX) 40 MG tablet, TAKE 1 TABLET BY MOUTH EVERY DAY, Disp: 90 tablet, Rfl: 1   pantoprazole (PROTONIX) 40 MG tablet, TAKE ONE TABLET BY MOUTH DAILY FOR REFLUX/HEARTBURN (TAKE ON AN EMPTY STOMACH 30 MINUTES PRIOR TO A MEAL), Disp: , Rfl:    PRALUENT 150 MG/ML SOAJ, INJECT 1 PEN INTO THE SKIN EVERY 14 (FOURTEEN) DAYS., Disp: 6 mL, Rfl: 3   sodium chloride (OCEAN) 0.65 % SOLN  nasal spray, Place 1 spray into both nostrils at bedtime., Disp: , Rfl:    TIADYLT ER 360 MG 24 hr capsule, TAKE 1 CAPSULE BY MOUTH EVERY DAY, Disp: 90 capsule, Rfl: 1   hydrALAZINE (APRESOLINE) 25 MG tablet, Take 25 mg by mouth 2 (two) times daily. (Patient not taking: Reported on 10/30/2021), Disp: , Rfl:     Physical Exam: Blood pressure 126/60, pulse 71, height '5\' 9"'$  (1.753 m), weight 214 lb (97.1 kg), SpO2 96 %.   Affect appropriate Healthy:  appears stated age 45: normal Neck supple with no adenopathy JVP normal bilateral bruits no thyromegaly Lungs clear with no wheezing and good diaphragmatic motion Heart:  S1/S2 SEM murmur, no rub, gallop or click PMI normal Abdomen: benighn, BS positve, no tenderness, no AAA no bruit.  No HSM or HJR Distal pulses intact with no bruits No edema Neuro non-focal Skin warm and dry No muscular weakness   Labs:   Lab Results  Component Value Date   WBC 7.0 08/11/2021   HGB 13.2 08/11/2021   HCT 38.4 08/11/2021   MCV 89 08/11/2021   PLT 313 08/11/2021   No results for input(s): "NA", "K", "CL", "CO2", "BUN", "CREATININE", "CALCIUM", "PROT", "BILITOT", "ALKPHOS", "ALT", "AST", "GLUCOSE" in the last 168 hours.  Invalid input(s): "LABALBU" No results found for: "CKTOTAL", "CKMB", "CKMBINDEX", "TROPONINI"  Lab Results  Component Value Date   CHOL 203 (H) 08/11/2021   CHOL 174 05/06/2020   CHOL 312 (H) 01/01/2020   Lab Results  Component Value Date   HDL 39 (L) 08/11/2021   HDL 38 (L) 05/06/2020   HDL 39 (L) 01/01/2020   Lab Results  Component Value Date   LDLCALC 121 (H) 08/11/2021   LDLCALC 103 (H) 05/06/2020   LDLCALC 224 (H) 01/01/2020   Lab Results  Component Value Date   TRIG 245 (H) 08/11/2021   TRIG 188 (H) 05/06/2020   TRIG 244 (H) 01/01/2020   Lab Results  Component  Value Date   CHOLHDL 5.2 (H) 08/11/2021   CHOLHDL 4.6 05/06/2020   CHOLHDL 8.0 (H) 01/01/2020   No results found for: "LDLDIRECT"     Radiology: DG Hand Complete Left  Result Date: 10/14/2021 CLINICAL DATA:  Left hand injury. EXAM: LEFT HAND - COMPLETE 3+ VIEW COMPARISON:  None Available. FINDINGS: Mild soft tissue swelling is present over the dorsum of the left hand. No underlying fracture or foreign body is present. Osteo arthritic degenerative changes are noted at the first [facility] joint and IP joint of the thumb. IMPRESSION: Mild soft tissue swelling over the dorsum of the hand without underlying fracture or foreign body. Electronically Signed   By: San Morelle M.D.   On: 10/14/2021 11:13     EKG: 2018 NSR Q lead 3 nonspecific ST changes 10/30/21 SR PR 226 Q waves 3,F    ASSESSMENT AND PLAN:   1. HLD:  In setting of DM , low percentile calcium score and only scattered aortic atherosclerosis Markedly improved on Praluent / Vascepa continue   2. HTN:  Continue cardizem, ACE and diuretic Follow Cr 2.1    3. DM:  Discussed low carb diet.  Target hemoglobin A1c is 6.5 or less.  Continue current medications. A1c not ideally controlled at 7.1   4. PVD:  Left CEA planned for severe stenosis in October with [facility] Dr Amil Amen  5. 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

## 2021-10-24 ENCOUNTER — Ambulatory Visit (INDEPENDENT_AMBULATORY_CARE_PROVIDER_SITE_OTHER): Payer: Medicare Other

## 2021-10-24 DIAGNOSIS — Z23 Encounter for immunization: Secondary | ICD-10-CM

## 2021-10-26 ENCOUNTER — Ambulatory Visit (INDEPENDENT_AMBULATORY_CARE_PROVIDER_SITE_OTHER): Payer: No Typology Code available for payment source

## 2021-10-26 VITALS — Ht 69.0 in | Wt 214.0 lb

## 2021-10-26 DIAGNOSIS — Z0000 Encounter for general adult medical examination without abnormal findings: Secondary | ICD-10-CM

## 2021-10-26 DIAGNOSIS — G8929 Other chronic pain: Secondary | ICD-10-CM | POA: Insufficient documentation

## 2021-10-26 DIAGNOSIS — Z9189 Other specified personal risk factors, not elsewhere classified: Secondary | ICD-10-CM | POA: Insufficient documentation

## 2021-10-26 DIAGNOSIS — F411 Generalized anxiety disorder: Secondary | ICD-10-CM | POA: Insufficient documentation

## 2021-10-26 DIAGNOSIS — N1831 Chronic kidney disease, stage 3a: Secondary | ICD-10-CM | POA: Insufficient documentation

## 2021-10-26 DIAGNOSIS — Z7729 Contact with and (suspected ) exposure to other hazardous substances: Secondary | ICD-10-CM | POA: Insufficient documentation

## 2021-10-26 DIAGNOSIS — Z461 Encounter for fitting and adjustment of hearing aid: Secondary | ICD-10-CM | POA: Insufficient documentation

## 2021-10-26 DIAGNOSIS — N183 Chronic kidney disease, stage 3 unspecified: Secondary | ICD-10-CM | POA: Insufficient documentation

## 2021-10-26 DIAGNOSIS — E119 Type 2 diabetes mellitus without complications: Secondary | ICD-10-CM | POA: Insufficient documentation

## 2021-10-26 DIAGNOSIS — E039 Hypothyroidism, unspecified: Secondary | ICD-10-CM | POA: Insufficient documentation

## 2021-10-26 NOTE — Patient Instructions (Signed)
Kyle Dunlap , Thank you for taking time to come for your Medicare Wellness Visit. I appreciate your ongoing commitment to your health goals. Please review the following plan we discussed and let me know if I can assist you in the future.   Screening recommendations/referrals: Colonoscopy: 10/03/20, every 5 years Recommended yearly ophthalmology/optometry visit for glaucoma screening and checkup Recommended yearly dental visit for hygiene and checkup  Vaccinations: Influenza vaccine: 11/15/20 Pneumococcal vaccine: 03/15/14 Tdap vaccine: 09/29/21 Shingles vaccine: Zostavax 03/05/13   Shingrix 03/10/20, need date of 2nd shot   Covid-19: 02/17/19, 03/10/19, 04/06/19, 11/17/19  Advanced directives: no  Conditions/risks identified: none  Next appointment: Follow up in one year for your annual wellness visit. 10/30/22 @ 2:30 pm by phone  Preventive Care 65 Years and Older, Male Preventive care refers to lifestyle choices and visits with your health care provider that can promote health and wellness. What does preventive care include? A yearly physical exam. This is also called an annual well check. Dental exams once or twice a year. Routine eye exams. Ask your health care provider how often you should have your eyes checked. Personal lifestyle choices, including: Daily care of your teeth and gums. Regular physical activity. Eating a healthy diet. Avoiding tobacco and drug use. Limiting alcohol use. Practicing safe sex. Taking low doses of aspirin every day. Taking vitamin and mineral supplements as recommended by your health care provider. What happens during an annual well check? The services and screenings done by your health care provider during your annual well check will depend on your age, overall health, lifestyle risk factors, and family history of disease. Counseling  Your health care provider may ask you questions about your: Alcohol use. Tobacco use. Drug use. Emotional  well-being. Home and relationship well-being. Sexual activity. Eating habits. History of falls. Memory and ability to understand (cognition). Work and work Statistician. Screening  You may have the following tests or measurements: Height, weight, and BMI. Blood pressure. Lipid and cholesterol levels. These may be checked every 5 years, or more frequently if you are over 79 years old. Skin check. Lung cancer screening. You may have this screening every year starting at age 44 if you have a 30-pack-year history of smoking and currently smoke or have quit within the past 15 years. Fecal occult blood test (FOBT) of the stool. You may have this test every year starting at age 31. Flexible sigmoidoscopy or colonoscopy. You may have a sigmoidoscopy every 5 years or a colonoscopy every 10 years starting at age 20. Prostate cancer screening. Recommendations will vary depending on your family history and other risks. Hepatitis C blood test. Hepatitis B blood test. Sexually transmitted disease (STD) testing. Diabetes screening. This is done by checking your blood sugar (glucose) after you have not eaten for a while (fasting). You may have this done every 1-3 years. Abdominal aortic aneurysm (AAA) screening. You may need this if you are a current or former smoker. Osteoporosis. You may be screened starting at age 9 if you are at high risk. Talk with your health care provider about your test results, treatment options, and if necessary, the need for more tests. Vaccines  Your health care provider may recommend certain vaccines, such as: Influenza vaccine. This is recommended every year. Tetanus, diphtheria, and acellular pertussis (Tdap, Td) vaccine. You may need a Td booster every 10 years. Zoster vaccine. You may need this after age 33. Pneumococcal 13-valent conjugate (PCV13) vaccine. One dose is recommended after age 63. Pneumococcal polysaccharide (  PPSV23) vaccine. One dose is recommended after  age 16. Talk to your health care provider about which screenings and vaccines you need and how often you need them. This information is not intended to replace advice given to you by your health care provider. Make sure you discuss any questions you have with your health care provider. Document Released: 02/04/2015 Document Revised: 09/28/2015 Document Reviewed: 11/09/2014 Elsevier Interactive Patient Education  2017 [place] Prevention in the Home Falls can cause injuries. They can happen to people of all ages. There are many things you can do to make your home safe and to help prevent falls. What can I do on the outside of my home? Regularly fix the edges of walkways and driveways and fix any cracks. Remove anything that might make you trip as you walk through a door, such as a raised step or threshold. Trim any bushes or trees on the path to your home. Use bright outdoor lighting. Clear any walking paths of anything that might make someone trip, such as rocks or tools. Regularly check to see if handrails are loose or broken. Make sure that both sides of any steps have handrails. Any raised decks and porches should have guardrails on the edges. Have any leaves, snow, or ice cleared regularly. Use sand or salt on walking paths during winter. Clean up any spills in your garage right away. This includes oil or grease spills. What can I do in the bathroom? Use night lights. Install grab bars by the toilet and in the tub and shower. Do not use towel bars as grab bars. Use non-skid mats or decals in the tub or shower. If you need to sit down in the shower, use a plastic, non-slip stool. Keep the floor dry. Clean up any water that spills on the floor as soon as it happens. Remove soap buildup in the tub or shower regularly. Attach bath mats securely with double-sided non-slip rug tape. Do not have throw rugs and other things on the floor that can make you trip. What can I do in the  bedroom? Use night lights. Make sure that you have a light by your bed that is easy to reach. Do not use any sheets or blankets that are too big for your bed. They should not hang down onto the floor. Have a firm chair that has side arms. You can use this for support while you get dressed. Do not have throw rugs and other things on the floor that can make you trip. What can I do in the kitchen? Clean up any spills right away. Avoid walking on wet floors. Keep items that you use a lot in easy-to-reach places. If you need to reach something above you, use a strong step stool that has a grab bar. Keep electrical cords out of the way. Do not use floor polish or wax that makes floors slippery. If you must use wax, use non-skid floor wax. Do not have throw rugs and other things on the floor that can make you trip. What can I do with my stairs? Do not leave any items on the stairs. Make sure that there are handrails on both sides of the stairs and use them. Fix handrails that are broken or loose. Make sure that handrails are as long as the stairways. Check any carpeting to make sure that it is firmly attached to the stairs. Fix any carpet that is loose or worn. Avoid having throw rugs at the top or  bottom of the stairs. If you do have throw rugs, attach them to the floor with carpet tape. Make sure that you have a light switch at the top of the stairs and the bottom of the stairs. If you do not have them, ask someone to add them for you. What else can I do to help prevent falls? Wear shoes that: Do not have high heels. Have rubber bottoms. Are comfortable and fit you well. Are closed at the toe. Do not wear sandals. If you use a stepladder: Make sure that it is fully opened. Do not climb a closed stepladder. Make sure that both sides of the stepladder are locked into place. Ask someone to hold it for you, if possible. Clearly mark and make sure that you can see: Any grab bars or  handrails. First and last steps. Where the edge of each step is. Use tools that help you move around (mobility aids) if they are needed. These include: Canes. Walkers. Scooters. Crutches. Turn on the lights when you go into a dark area. Replace any light bulbs as soon as they burn out. Set up your furniture so you have a clear path. Avoid moving your furniture around. If any of your floors are uneven, fix them. If there are any pets around you, be aware of where they are. Review your medicines with your doctor. Some medicines can make you feel dizzy. This can increase your chance of falling. Ask your doctor what other things that you can do to help prevent falls. This information is not intended to replace advice given to you by your health care provider. Make sure you discuss any questions you have with your health care provider. Document Released: 11/04/2008 Document Revised: 06/16/2015 Document Reviewed: 02/12/2014 Elsevier Interactive Patient Education  2017 [employer].

## 2021-10-26 NOTE — Progress Notes (Signed)
Virtual Visit via Telephone Note  I connected with  Kyle Dunlap on 10/26/21 at  1:00 PM EDT by telephone and verified that I am speaking with the correct person using two identifiers.  Location: Patient: home Provider: [facility] Persons participating in the virtual visit: patient/Nurse Health Advisor   I discussed the limitations, risks, security and privacy concerns of performing an evaluation and management service by telephone and the availability of in person appointments. The patient expressed understanding and agreed to proceed.  Interactive audio and video telecommunications were attempted between this nurse and patient, however failed, due to patient having technical difficulties OR patient did not have access to video capability.  We continued and completed visit with audio only.  Some vital signs may be absent or patient reported.   Dionisio David, LPN  Subjective:   Kyle Dunlap is a 76 y.o. male who presents for Medicare Annual/Subsequent preventive examination.  Review of Systems     Cardiac Risk Factors include: advanced age (>23mn, >>43women);diabetes mellitus;male gender     Objective:    There were no vitals filed for this visit. There is no height or weight on file to calculate BMI.     10/26/2021    1:08 PM 10/03/2020    7:13 AM 09/20/2020    6:18 PM 02/20/2017   10:49 AM 10/26/2016    7:45 AM 09/28/2016    7:18 AM 09/25/2016   10:08 AM  Advanced Directives  Does Patient Have a Medical Advance Directive? No Yes Yes Yes No Yes Yes  Type of Advance Directive  Living will Healthcare Power of Attorney Living will   [place]  Copy of [place]?   Yes - validated most recent copy scanned in chart (See row information)   No - copy requested No - copy requested  Would patient like information on creating a medical advance directive? No - Patient declined          Current Medications  (verified) Outpatient Encounter Medications as of 10/26/2021  Medication Sig   acetaminophen (TYLENOL) 325 MG tablet Take 325 mg by mouth every 6 (six) hours as needed for moderate pain.   albuterol (VENTOLIN HFA) 108 (90 Base) MCG/ACT inhaler TAKE 2 PUFFS BY MOUTH EVERY 6 HOURS AS NEEDED FOR WHEEZE OR SHORTNESS OF BREATH   Ascorbic Acid (VITAMIN C) 1000 MG tablet Take 1,000 mg by mouth daily.   aspirin 81 MG tablet Take 81 mg by mouth daily.   diazepam (VALIUM) 2 MG tablet TAKE ONE TABLET ('2MG'$  TOTAL) BY MOUTH DAILY AS NEEDED FOR DIZZINESS SECONDARY TO MENIERE'S DISEASE   diclofenac Sodium (VOLTAREN) 1 % GEL APPLY 4 GRAMS TO AFFECTED AREA THREE TIMES A DAY AS NEEDED FOR ARTHRITIS PAIN   DULoxetine (CYMBALTA) 60 MG capsule Take 1 capsule (60 mg total) by mouth daily.   enalapril (VASOTEC) 20 MG tablet TAKE 1 TABLET BY MOUTH EVERY DAY   glipiZIDE (GLUCOTROL) 10 MG tablet TAKE ONE TABLET BY MOUTH TWICE A DAY FOR DIABETES (TAKE THIS MEDICATION 30 MINUTES PRIOR TO A MEAL)   glipiZIDE (GLUCOTROL) 5 MG tablet TAKE 2 TABLETS BY MOUTH TWICE A DAY   hydrALAZINE (APRESOLINE) 25 MG tablet Take 25 mg by mouth 2 (two) times daily.   hydrALAZINE (APRESOLINE) 50 MG tablet Take 1 tablet by mouth 2 (two) times daily.   levothyroxine (SYNTHROID) 25 MCG tablet TAKE ONE TABLET BY MOUTH DAILY FOR HYPOTHYROIDISM   levothyroxine (SYNTHROID)  50 MCG tablet TAKE ONE TABLET BY MOUTH DAILY FOR HYPOTHYROIDISM   Multiple Vitamin (MULTIVITAMIN WITH MINERALS) TABS Take 1 tablet by mouth daily.   pantoprazole (PROTONIX) 40 MG tablet TAKE ONE TABLET BY MOUTH DAILY FOR REFLUX/HEARTBURN (TAKE ON AN EMPTY STOMACH 30 MINUTES PRIOR TO A MEAL)   PRALUENT 150 MG/ML SOAJ INJECT 1 PEN INTO THE SKIN EVERY 14 (FOURTEEN) DAYS.   sodium chloride (OCEAN) 0.65 % SOLN nasal spray Place 1 spray into both nostrils at bedtime.   TIADYLT ER 360 MG 24 hr capsule TAKE 1 CAPSULE BY MOUTH EVERY DAY   [DISCONTINUED] aspirin EC 81 MG tablet Take 1 tablet  by mouth daily.   [DISCONTINUED] enalapril (VASOTEC) 20 MG tablet TAKE ONE TABLET BY MOUTH DAILY FOR HYPERTENSION   empagliflozin (JARDIANCE) 10 MG TABS tablet Take 1 tablet (10 mg total) by mouth daily before breakfast. (Patient not taking: Reported on 10/26/2021)   pantoprazole (PROTONIX) 40 MG tablet TAKE 1 TABLET BY MOUTH EVERY DAY   No facility-administered encounter medications on file as of 10/26/2021.    Allergies (verified) Atorvastatin, Zetia [ezetimibe], Zocor [simvastatin], and Codeine   History: Past Medical History:  Diagnosis Date   Adenomatous colon polyp 11/22/2008   Dr. Gala Romney   Allergy    Chronic back pain    DDD (degenerative disc disease), lumbar    Hemorrhoids, external    High cholesterol    HTN (hypertension)    Hyperplastic colon polyp 11/22/2008   Dr. Gala Romney   Kidney stone    "14 years ago" per pt   Meniere's disease    PTSD (post-traumatic stress disorder)    Tachyarrhythmia    Type 2 diabetes mellitus ([place])    Past Surgical History:  Procedure Laterality Date   CATARACT EXTRACTION W/PHACO Left 09/28/2016   Procedure: CATARACT EXTRACTION PHACO AND INTRAOCULAR LENS PLACEMENT ([place]);  Surgeon: Baruch Goldmann, MD;  Location: [facility];  Service: Ophthalmology;  Laterality: Left;  CDE: 4.40   CATARACT EXTRACTION W/PHACO Right 10/26/2016   Procedure: CATARACT EXTRACTION PHACO AND INTRAOCULAR LENS PLACEMENT RIGHT EYE;  Surgeon: Baruch Goldmann, MD;  Location: [facility];  Service: Ophthalmology;  Laterality: Right;  CDE: 6.74   CHOLECYSTECTOMY     COLONOSCOPY  12/10/2008   Dr. Gala Romney- L side diverticula, adenomatous polyp, hyperplastic polyp   COLONOSCOPY  11/16/2003   Dr. Laural Golden- performed exam to cecum,3 polyps- no path report available, external hemorrhoids,    COLONOSCOPY N/A 01/04/2014   Procedure: COLONOSCOPY;  Surgeon: Daneil Dolin, MD;  Location: [facility];  Service: Endoscopy;  Laterality: N/A;  8:30am   COLONOSCOPY N/A 02/20/2017   Surgeon: Daneil Dolin, MD; multiple colon polyps ranging 3 to 9 mm in size.  Pathology with tubular adenomas.  Recommended repeat in 3 years.   COLONOSCOPY WITH PROPOFOL N/A 10/03/2020   Procedure: COLONOSCOPY WITH PROPOFOL;  Surgeon: Daneil Dolin, MD;  Location: [facility];  Service: Endoscopy;  Laterality: N/A;  8:30am   ESOPHAGOGASTRODUODENOSCOPY  08/31/2011   QMV:HQIONG esophageal erosions consistent with mild erosive reflux esophagitis   MOUTH SURGERY     POLYPECTOMY  10/03/2020   Procedure: POLYPECTOMY;  Surgeon: Daneil Dolin, MD;  Location: [facility];  Service: Endoscopy;;   Family History  Problem Relation Age of Onset   Heart attack Father    Colon cancer Brother 44   Social History   Socioeconomic History   Marital status: Married    Spouse name: Not on file  Number of children: Not on file   Years of education: Not on file   Highest education level: Not on file  Occupational History   Not on file  Tobacco Use   Smoking status: Former   Smokeless tobacco: Former    Quit date: 07/14/1985  Vaping Use   Vaping Use: Never used  Substance and Sexual Activity   Alcohol use: Yes    Comment: occasionally- 3-4 mixed drinks per week.   Drug use: No   Sexual activity: Yes    Birth control/protection: None  Other Topics Concern   Not on file  Social History Narrative   Not on file   Social Determinants of Health   Financial Resource Strain: Low Risk  (10/26/2021)   Overall Financial Resource Strain (CARDIA)    Difficulty of Paying Living Expenses: Not hard at all  Food Insecurity: No Food Insecurity (10/26/2021)   Hunger Vital Sign    Worried About Running Out of Food in the Last Year: Never true    Ran Out of Food in the Last Year: Never true  Transportation Needs: No Transportation Needs (10/26/2021)   PRAPARE - Hydrologist (Medical): No    Lack of Transportation (Non-Medical): No  Physical Activity: Inactive (10/26/2021)   Exercise Vital  Sign    Days of Exercise per Week: 0 days    Minutes of Exercise per Session: 0 min  Stress: No Stress Concern Present (10/26/2021)   [place]    Feeling of Stress : Not at all  Social Connections: Moderately Integrated (10/26/2021)   Social Connection and Isolation Panel [NHANES]    Frequency of Communication with Friends and Family: Twice a week    Frequency of Social Gatherings with Friends and Family: Twice a week    Attends Religious Services: More than 4 times per year    Active Member of [employer] or Organizations: No    Attends Music therapist: Never    Marital Status: Married    Tobacco Counseling Counseling given: Not Answered   Clinical Intake:  Pre-visit preparation completed: Yes  Pain : No/denies pain     Nutritional Risks: None Diabetes: Yes CBG done?: No Did pt. bring in CBG monitor from home?: No  How often do you need to have someone help you when you read instructions, pamphlets, or other written materials from your doctor or pharmacy?: 1 - Never  Diabetic?no Nutrition Risk Assessment:  Has the patient had any N/V/D within the last 2 months?  No  Does the patient have any non-healing wounds?  No  Has the patient had any unintentional weight loss or weight gain?  No   Diabetes:  Is the patient diabetic?  Yes  If diabetic, was a CBG obtained today?  No  Did the patient bring in their glucometer from home?  No  How often do you monitor your CBG's? Once/week   Financial Strains and Diabetes Management:  Are you having any financial strains with the device, your supplies or your medication? No .  Does the patient want to be seen by Chronic Care Management for management of their diabetes?  No  Would the patient like to be referred to a Nutritionist or for Diabetic Management?  No   Diabetic Exams:  Diabetic Eye Exam: Completed 06/08/20. Overdue for diabetic eye exam. Pt  has been advised about the importance in completing this exam.   Diabetic Foot Exam: Completed  11/01/20. Pt has been advised about the importance in completing this exam.   Interpreter Needed?: No  Information entered by :: Kirke Shaggy, LPN   Activities of Daily Living    10/26/2021    1:09 PM  In your present state of health, do you have any difficulty performing the following activities:  Hearing? 1  Vision? 0  Difficulty concentrating or making decisions? 0  Walking or climbing stairs? 1  Dressing or bathing? 0  Doing errands, shopping? 0  Preparing Food and eating ? N  Using the Toilet? N  In the past six months, have you accidently leaked urine? N  Do you have problems with loss of bowel control? N  Managing your Medications? N  Managing your Finances? N  Housekeeping or managing your Housekeeping? N    Patient Care Team: Coral Spikes, DO as PCP - General (Family Medicine) Josue Hector, MD as PCP - Cardiology (Cardiology) Gala Romney Cristopher Estimable, MD (Gastroenterology)  Indicate any recent Medical Services you may have received from other than [facility] providers in the past year (date may be approximate).     Assessment:   This is a routine wellness examination for [place].  Hearing/Vision screen Hearing Screening - Comments:: Has aids Vision Screening - Comments:: Readers- [facility]  Dietary issues and exercise activities discussed: Current Exercise Habits: The patient does not participate in regular exercise at present   Goals Addressed             This Visit's Progress    DIET - EAT MORE FRUITS AND VEGETABLES         Depression Screen    10/26/2021    1:07 PM 11/15/2020    1:16 PM 09/20/2020    6:08 PM 01/01/2020   10:15 AM 12/23/2018   10:35 AM 03/10/2018    9:10 AM 12/10/2017    9:24 AM  PHQ 2/9 Scores  PHQ - 2 Score 0 0 1 0 0 0 0  PHQ- 9 Score 0  3        Fall Risk    10/26/2021    1:09 PM 11/15/2020    1:16 PM 09/20/2020    6:20 PM  06/29/2020   10:38 AM 01/01/2020   10:14 AM  Fall Risk   Falls in the past year? 1 0 0 0 0  Number falls in past yr: 1  0 0   Injury with Fall? 1  0 0   Risk for fall due to : History of fall(s) No Fall Risks No Fall Risks No Fall Risks   Follow up Falls prevention discussed;Falls evaluation completed Falls evaluation completed Falls evaluation completed Falls evaluation completed Falls evaluation completed    FALL RISK PREVENTION PERTAINING TO THE HOME:  Any stairs in or around the home? No  If so, are there any without handrails? No  Home free of loose throw rugs in walkways, pet beds, electrical cords, etc? Yes  Adequate lighting in your home to reduce risk of falls? Yes   ASSISTIVE DEVICES UTILIZED TO PREVENT FALLS:  Life alert? No  Use of a cane, walker or w/c? No  Grab bars in the bathroom? No  Shower chair or bench in shower? No  Elevated toilet seat or a handicapped toilet? No    Cognitive Function:        10/26/2021    1:11 PM 09/20/2020    6:23 PM  6CIT Screen  What Year? 0 points 0 points  What month?  0 points 0 points  What time? 0 points 0 points  Count back from 20 0 points 0 points  Months in reverse 0 points 0 points  Repeat phrase 2 points 0 points  Total Score 2 points 0 points    Immunizations Immunization History  Administered Date(s) Administered   Fluad Quad(high Dose 65+) 11/15/2020, 10/24/2021   H1N1 01/01/2008   Influenza,inj,Quad PF,6+ Mos 10/22/2014, 10/25/2015, 11/07/2016, 10/23/2017, 10/24/2018, 10/05/2019   Influenza-Unspecified 10/23/2010, 10/28/2012, 10/22/2013, 10/28/2019   Moderna Sars-Covid-2 Vaccination 03/09/2019, 04/06/2019, 11/17/2019   PFIZER(Purple Top)SARS-COV-2 Vaccination 02/17/2019, 03/10/2019, 10/22/2019   Pneumococcal Conjugate-13 03/15/2014   Pneumococcal Polysaccharide-23 09/22/2009   Td 07/17/2007   Tdap 09/29/2021   Zoster Recombinat (Shingrix) 03/10/2020   Zoster, Live 03/05/2013    TDAP status: Up to  date  Flu Vaccine status: Up to date  Pneumococcal vaccine status: Up to date  Covid-19 vaccine status: Completed vaccines  Qualifies for Shingles Vaccine? Yes   Zostavax completed Yes   Shingrix Completed?: No.    Education has been provided regarding the importance of this vaccine. Patient has been advised to call insurance company to determine out of pocket expense if they have not yet received this vaccine. Advised may also receive vaccine at local pharmacy or Health Dept. Verbalized acceptance and understanding.  Screening Tests Health Maintenance  Topic Date Due   Diabetic kidney evaluation - Urine ACR  12/23/2019   Zoster Vaccines- Shingrix (2 of 2) 05/05/2020   OPHTHALMOLOGY EXAM  06/08/2021   FOOT EXAM  11/01/2021   HEMOGLOBIN A1C  02/11/2022   Diabetic kidney evaluation - GFR measurement  10/06/2022   TETANUS/TDAP  09/30/2031   Pneumonia Vaccine 59+ Years old  Completed   INFLUENZA VACCINE  Completed   Hepatitis C Screening  Completed   HPV VACCINES  Aged Out   COLONOSCOPY (Pts 45-82yr Insurance coverage will need to be confirmed)  Discontinued   COVID-19 Vaccine  Discontinued    Health Maintenance  Health Maintenance Due  Topic Date Due   Diabetic kidney evaluation - Urine ACR  12/23/2019   Zoster Vaccines- Shingrix (2 of 2) 05/05/2020   OPHTHALMOLOGY EXAM  06/08/2021    Colorectal cancer screening: Type of screening: Colonoscopy. Completed 10/03/20. Repeat every 5 years  Lung Cancer Screening: (Low Dose CT Chest recommended if Age 867-80years, 30 pack-year currently smoking OR have quit w/in 15years.) does not qualify.   Additional Screening:  Hepatitis C Screening: does qualify; Completed 03/15/15  Vision Screening: Recommended annual ophthalmology exams for early detection of glaucoma and other disorders of the eye. Is the patient up to date with their annual eye exam?  Yes  Who is the provider or what is the name of the office in which the patient  attends annual [facility] exams[facility] provider, would they like to be referred to a provider to establish care? No .   Dental Screening: Recommended annual dental exams for proper oral hygiene  Community Resource Referral / Chronic Care Management: CRR required this visit?  No   CCM required this visit?  No      Plan:     I have personally reviewed and noted the following in the patient's chart:   Medical and social history Use of alcohol, tobacco or illicit drugs  Current medications and supplements including opioid prescriptions. Patient is not currently taking opioid prescriptions. Functional ability and status Nutritional status Physical activity Advanced directives List of other physicians Hospitalizations,  surgeries, and ER visits in previous 12 months Vitals Screenings to include cognitive, depression, and falls Referrals and appointments  In addition, I have reviewed and discussed with patient certain preventive protocols, quality metrics, and best practice recommendations. A written personalized care plan for preventive services as well as general preventive health recommendations were provided to patient.     Dionisio David, LPN   [id number]   Nurse Notes: none

## 2021-10-30 ENCOUNTER — Encounter: Payer: Self-pay | Admitting: Cardiovascular Disease

## 2021-10-30 ENCOUNTER — Ambulatory Visit: Payer: Medicare Other | Attending: Cardiovascular Disease | Admitting: Cardiovascular Disease

## 2021-10-30 VITALS — BP 126/60 | HR 71 | Ht 69.0 in | Wt 214.0 lb

## 2021-10-30 DIAGNOSIS — I6523 Occlusion and stenosis of bilateral carotid arteries: Secondary | ICD-10-CM | POA: Diagnosis not present

## 2021-10-30 DIAGNOSIS — G720 Drug-induced myopathy: Secondary | ICD-10-CM

## 2021-10-30 DIAGNOSIS — T466X5D Adverse effect of antihyperlipidemic and antiarteriosclerotic drugs, subsequent encounter: Secondary | ICD-10-CM

## 2021-10-30 DIAGNOSIS — R011 Cardiac murmur, unspecified: Secondary | ICD-10-CM | POA: Diagnosis not present

## 2021-10-30 DIAGNOSIS — E785 Hyperlipidemia, unspecified: Secondary | ICD-10-CM

## 2021-10-30 DIAGNOSIS — I10 Essential (primary) hypertension: Secondary | ICD-10-CM | POA: Diagnosis not present

## 2021-10-30 NOTE — Patient Instructions (Signed)
Medication Instructions:  Your physician recommends that you continue on your current medications as directed. Please refer to the Current Medication list given to you today.  *If you need a refill on your cardiac medications before your next appointment, please call your pharmacy*   Lab Work: NONE   If you have labs (blood work) drawn today and your tests are completely normal, you will receive your results only by: [place] (if you have MyChart) OR A paper copy in the mail If you have any lab test that is abnormal or we need to change your treatment, we will call you to review the results.   Testing/Procedures: Calcium Score CT    Follow-Up: At [facility], you and your health needs are our priority.  As part of our continuing mission to provide you with exceptional heart care, we have created designated Provider Care Teams.  These Care Teams include your primary Cardiologist (physician) and Advanced Practice Providers (APPs -  Physician Assistants and Nurse Practitioners) who all work together to provide you with the care you need, when you need it.  We recommend signing up for the patient portal called "MyChart".  Sign up information is provided on this After Visit Summary.  MyChart is used to connect with patients for Virtual Visits (Telemedicine).  Patients are able to view lab/test results, encounter notes, upcoming appointments, etc.  Non-urgent messages can be sent to your provider as well.   To learn more about what you can do with MyChart, go to [email or web address].    Your next appointment:   1 year(s)  The format for your next appointment:   In Person  Provider:   Jenkins Rouge, MD    Other Instructions Thank you for choosing [facility]!    Important Information About Sugar

## 2021-10-30 NOTE — Addendum Note (Signed)
Addended by: Levonne Hubert on: 10/30/2021 03:07 PM   Modules accepted: Orders

## 2021-10-30 NOTE — Addendum Note (Signed)
Addended by: Christella Scheuermann C on: 10/30/2021 03:22 PM   Modules accepted: Orders

## 2021-11-02 ENCOUNTER — Encounter: Payer: Self-pay | Admitting: Family Medicine

## 2021-11-02 ENCOUNTER — Other Ambulatory Visit: Payer: Self-pay

## 2021-11-02 MED ORDER — ALBUTEROL SULFATE HFA 108 (90 BASE) MCG/ACT IN AERS: INHALATION_SPRAY | RESPIRATORY_TRACT | 6 refills | Status: DC

## 2021-11-08 ENCOUNTER — Encounter: Payer: Self-pay | Admitting: Family Medicine

## 2021-11-09 ENCOUNTER — Ambulatory Visit: Payer: No Typology Code available for payment source | Admitting: Family Medicine

## 2021-11-13 NOTE — Pre-Procedure Instructions (Addendum)
Surgical Instructions    Your procedure is scheduled on Monday October 30.  Report to [facility] Main Entrance "A" at 5:30 A.M., then check in with the Admitting office.  Call this number if you have problems the morning of surgery:  [phone]  If you have any questions prior to your surgery date call [phone]: Open Monday-Friday 8am-4pm  If you experience any cold or flu symptoms such as cough, fever, chills, shortness of breath, etc. between now and your scheduled surgery, please notify us at the above number     Remember:  Do not eat or drink after midnight the night before your surgery    Take these medicines the morning of surgery with A SIP OF WATER:   DULoxetine (CYMBALTA) hydrALAZINE (APRESOLINE) levothyroxine (SYNTHROID) pantoprazole (PROTONIX) PRALUENT 150 MG/ML SOAJ TIADYLT ER 360 MG 24 hr capsule   IF NEEDED: acetaminophen (TYLENOL) albuterol (VENTOLIN HFA) 108 (90 Base) MCG/ACT inhaler diazepam (VALIUM)  Please bring all inhalers with you the day of surgery.    Follow your surgeon's instructions on when to stop Aspirin.  If no instructions were given by your surgeon then you will need to call the office to get those instructions.    As of today, STOP taking any Aleve, Naproxen, Ibuprofen, Motrin, Advil, Goody's, BC's, all herbal medications, fish oil, and all vitamins. THIS INCLUDES diclofenac Sodium (VOLTAREN)   WHAT DO I DO ABOUT MY DIABETES MEDICATION?  Do not take glipiZIDE (GLUCOTROL) the morning of surgery.    HOW TO MANAGE YOUR DIABETES BEFORE AND AFTER SURGERY  Why is it important to control my blood sugar before and after surgery? Improving blood sugar levels before and after surgery helps healing and can limit problems. A way of improving blood sugar control is eating a healthy diet by:  Eating less sugar and carbohydrates  Increasing activity/exercise  Talking with your doctor about reaching your blood sugar goals High blood sugars  (greater than 180 mg/dL) can raise your risk of infections and slow your recovery, so you will need to focus on controlling your diabetes during the weeks before surgery. Make sure that the doctor who takes care of your diabetes knows about your planned surgery including the date and location.  How do I manage my blood sugar before surgery? Check your blood sugar at least 4 times a day, starting 2 days before surgery, to make sure that the level is not too high or low.  Check your blood sugar the morning of your surgery when you wake up and every 2 hours until you get to the Short Stay unit.  If your blood sugar is less than 70 mg/dL, you will need to treat for low blood sugar: Do not take insulin. Treat a low blood sugar (less than 70 mg/dL) with  cup of clear juice (cranberry or apple), 4 glucose tablets, OR glucose gel. Recheck blood sugar in 15 minutes after treatment (to make sure it is greater than 70 mg/dL). If your blood sugar is not greater than 70 mg/dL on recheck, call [phone] for further instructions. Report your blood sugar to the short stay nurse when you get to Short Stay.  If you are admitted to the hospital after surgery: Your blood sugar will be checked by the staff and you will probably be given insulin after surgery (instead of oral diabetes medicines) to make sure you have good blood sugar levels. The goal for blood sugar control after surgery is 80-180 mg/dL.  Do NOT Smoke (Tobacco/Vaping)  24 hours prior to your procedure  If you use a CPAP at night, you may bring your mask for your overnight stay.   Contacts, glasses, hearing aids, dentures or partials may not be worn into surgery, please bring cases for these belongings   For patients admitted to the hospital, discharge time will be determined by your treatment team.   Patients discharged the day of surgery will not be allowed to drive home, and someone needs to stay with them for 24  hours.   SURGICAL WAITING ROOM VISITATION Patients having surgery or a procedure may have no more than 2 support people in the waiting area - these visitors may rotate.   Children under the age of 62 must have an adult with them who is not the patient. If the patient needs to stay at the hospital during part of their recovery, the visitor guidelines for inpatient rooms apply. Pre-op nurse will coordinate an appropriate time for 1 support person to accompany patient in pre-op.  This support person may not rotate.   Please refer to the [facility] website for the visitor guidelines for Inpatients (after your surgery is over and you are in a regular room).    Special instructions:    Oral Hygiene is also important to reduce your risk of infection.  Remember - BRUSH YOUR TEETH THE MORNING OF SURGERY WITH YOUR REGULAR TOOTHPASTE   [place]- Preparing For Surgery  Before surgery, you can play an important role. Because skin is not sterile, your skin needs to be as free of germs as possible. You can reduce the number of germs on your skin by washing with CHG (chlorahexidine gluconate) Soap before surgery.  CHG is an antiseptic cleaner which kills germs and bonds with the skin to continue killing germs even after washing.     Please do not use if you have an allergy to CHG or antibacterial soaps. If your skin becomes reddened/irritated stop using the CHG.  Do not shave (including legs and underarms) for at least 48 hours prior to first CHG shower. It is OK to shave your face.  Please follow these instructions carefully.     Shower the NIGHT BEFORE SURGERY and the MORNING OF SURGERY with CHG Soap.   If you chose to wash your hair, wash your hair first as usual with your normal shampoo. After you shampoo, rinse your hair and body thoroughly to remove the shampoo.  Then [employer] and genitals (private parts) with your normal soap and rinse thoroughly to remove soap.  After that Use CHG Soap as  you would any other liquid soap. You can apply CHG directly to the skin and wash gently with a scrungie or a clean washcloth.   Apply the CHG Soap to your body ONLY FROM THE NECK DOWN.  Do not use on open wounds or open sores. Avoid contact with your eyes, ears, mouth and genitals (private parts). Wash Face and genitals (private parts)  with your normal soap.   Wash thoroughly, paying special attention to the area where your surgery will be performed.  Thoroughly rinse your body with warm water from the neck down.  DO NOT shower/wash with your normal soap after using and rinsing off the CHG Soap.  Pat yourself dry with a CLEAN TOWEL.  Wear CLEAN PAJAMAS to bed the night before surgery  Place CLEAN SHEETS on your bed the night before your surgery  DO NOT SLEEP WITH PETS.  Day of Surgery:  Take a shower with CHG soap. Wear Clean/Comfortable clothing the morning of surgery Remember to brush your teeth WITH YOUR REGULAR TOOTHPASTE. Do not wear jewelry. Do not wear lotions, powders, cologne or deodorant. Men may shave face and neck. Do not bring valuables to the hospital.  [facility] is not responsible for any belongings or valuables.    If you received a COVID test during your pre-op visit, it is requested that you wear a mask when out in public, stay away from anyone that may not be feeling well, and notify your surgeon if you develop symptoms. If you have been in contact with anyone that has tested positive in the last 10 days, please notify your surgeon.    Please read over the following fact sheets that you were given.

## 2021-11-14 ENCOUNTER — Other Ambulatory Visit: Payer: Self-pay

## 2021-11-14 ENCOUNTER — Encounter (HOSPITAL_COMMUNITY)
Admission: RE | Admit: 2021-11-14 | Discharge: 2021-11-14 | Disposition: A | Discharge status: Home / Self Care | Payer: Medicare Other | Source: Ambulatory Visit | Attending: Vascular Surgery | Admitting: Vascular Surgery

## 2021-11-14 ENCOUNTER — Encounter (HOSPITAL_COMMUNITY): Payer: Self-pay

## 2021-11-14 DIAGNOSIS — I6522 Occlusion and stenosis of left carotid artery: Secondary | ICD-10-CM | POA: Insufficient documentation

## 2021-11-14 DIAGNOSIS — Z01812 Encounter for preprocedural laboratory examination: Secondary | ICD-10-CM | POA: Diagnosis present

## 2021-11-14 HISTORY — DX: Hypothyroidism, unspecified: E03.9

## 2021-11-14 HISTORY — DX: Chronic kidney disease, unspecified: N18.9

## 2021-11-14 HISTORY — DX: Occlusion and stenosis of unspecified carotid artery: I65.29

## 2021-11-14 HISTORY — DX: Gastro-esophageal reflux disease without esophagitis: K21.9

## 2021-11-14 LAB — TYPE AND SCREEN
ABO/RH(D): B NEG
Antibody Screen: NEGATIVE

## 2021-11-14 LAB — URINALYSIS, ROUTINE W REFLEX MICROSCOPIC
Bilirubin Urine: NEGATIVE
Glucose, UA: NEGATIVE mg/dL
Hgb urine dipstick: NEGATIVE
Ketones, ur: NEGATIVE mg/dL
Nitrite: NEGATIVE
Protein, ur: 100 mg/dL — AB
Specific Gravity, Urine: 1.012 (ref 1.005–1.030)
pH: 6 (ref 5.0–8.0)

## 2021-11-14 LAB — COMPREHENSIVE METABOLIC PANEL
ALT: 27 U/L (ref 0–44)
AST: 23 U/L (ref 15–41)
Albumin: 3.7 g/dL (ref 3.5–5.0)
Alkaline Phosphatase: 88 U/L (ref 38–126)
Anion gap: 7 (ref 5–15)
BUN: 19 mg/dL (ref 8–23)
CO2: 24 mmol/L (ref 22–32)
Calcium: 9 mg/dL (ref 8.9–10.3)
Chloride: 103 mmol/L (ref 98–111)
Creatinine, Ser: 1.38 mg/dL — ABNORMAL HIGH (ref 0.61–1.24)
GFR, Estimated: 52 mL/min — ABNORMAL LOW (ref >60)
Glucose, Bld: 153 mg/dL — ABNORMAL HIGH (ref 70–99)
Potassium: 4.2 mmol/L (ref 3.5–5.1)
Sodium: 134 mmol/L — ABNORMAL LOW (ref 135–145)
Total Bilirubin: 0.8 mg/dL (ref 0.3–1.2)
Total Protein: 7 g/dL (ref 6.5–8.1)

## 2021-11-14 LAB — PROTIME-INR
INR: 1.1 (ref 0.8–1.2)
Prothrombin Time: 13.8 seconds (ref 11.4–15.2)

## 2021-11-14 LAB — APTT: aPTT: 32 seconds (ref 24–36)

## 2021-11-14 LAB — CBC
HCT: 36.6 % — ABNORMAL LOW (ref 39.0–52.0)
Hemoglobin: 12.6 g/dL — ABNORMAL LOW (ref 13.0–17.0)
MCH: 30.7 pg (ref 26.0–34.0)
MCHC: 34.4 g/dL (ref 30.0–36.0)
MCV: 89.1 fL (ref 80.0–100.0)
Platelets: 278 10*3/uL (ref 150–400)
RBC: 4.11 MIL/uL — ABNORMAL LOW (ref 4.22–5.81)
RDW: 12.1 % (ref 11.5–15.5)
WBC: 8.1 10*3/uL (ref 4.0–10.5)
nRBC: 0 % (ref 0.0–0.2)

## 2021-11-14 LAB — GLUCOSE, CAPILLARY: Glucose-Capillary: 145 mg/dL — ABNORMAL HIGH (ref 70–99)

## 2021-11-14 LAB — SURGICAL PCR SCREEN
MRSA, PCR: NEGATIVE
Staphylococcus aureus: NEGATIVE

## 2021-11-14 NOTE — Progress Notes (Signed)
PCP - Thersa Salt DO Cardiologist - Johnsie Cancel also goes to [facility]  PPM/ICD - denies  Chest x-ray - n/a EKG - 10/30/21 Stress Test - denies ECHO - denies Cardiac Cath - denies  Sleep Study - denies CPAP - n/a  Fasting Blood Sugar - 120-140 Checks Blood Sugar not very often  Follow your surgeon's instructions on when to stop Aspirin.  If no instructions were given by your surgeon then you will need to call the office to get those instructions.   *Patient states no instructions given*   As of today, STOP taking any Aleve, Naproxen, Ibuprofen, Motrin, Advil, Goody's, BC's, all herbal medications, fish oil, and all vitamins. THIS INCLUDES diclofenac Sodium (VOLTAREN)  ERAS Protcol - no PRE-SURGERY Ensure or G2- no  COVID TEST- no   Anesthesia review: yes, murmur- spoke with Jeneen Rinks during PAT  Patient denies shortness of breath, fever, cough and chest pain at PAT appointment   All instructions explained to the patient, with a verbal understanding of the material. Patient agrees to go over the instructions while at home for a better understanding.  The opportunity to ask questions was provided.

## 2021-11-14 NOTE — Progress Notes (Signed)
Informed Dr. Rayvon Char office of abnormal lab results.

## 2021-11-14 NOTE — Progress Notes (Signed)
Sleep apnea screening results sent to PCP via staff message.

## 2021-11-15 ENCOUNTER — Encounter (HOSPITAL_COMMUNITY): Payer: Self-pay

## 2021-11-15 ENCOUNTER — Ambulatory Visit: Payer: Self-pay | Admitting: Family Medicine

## 2021-11-15 NOTE — Progress Notes (Signed)
Anesthesia Chart Review:  Case: [id number] Date/Time: 11/20/21 0715   Procedure: LEFT CAROTID ENDARTERECTOMY (Left)   Anesthesia type: General   Pre-op diagnosis: Left carotid artery stenosis   Location: [facility] OR ROOM 11 / [facility]   Surgeons: Broadus John, MD       DISCUSSION: Patient is a 76 year old male scheduled for the above procedure.   History includes former smoker (quit 01/23/83), HTN, hypercholesterolemia, DM2, carotid artery stenosis, tachyarrhythmia, murmur (S1/S2 SEM 10/30/21), CKD (stage III), GERD, hypothyroidism, agent orange exposure.   He was first referred to cardiologist Dr. Johnsie Cancel in 02/2020 for HLD in setting of statin intolerance and family history of CAD. He was started on Praluent and Vascepa. 04/01/20 coronary calcium score was 42 (20 percentile for age and sex matched control). In June 2023, he had a carotid US showing 12-75% LICA stenosis with no significant disease of the RICA. He was asymptomatic. CTA of the neck on 08/22/21 showed up to 80% stenosis proximal left ICA and moderate to severe stenosis in the left cavernous ICA. He was referred to vascular surgeon Dr. Sherren Mocha Early and left carotid endarterectomy recommended. Mr. Defalco wished to delay until after his beach trip in October. Surgery scheduled with Dr. Virl Cagey. Dr. Johnsie Cancel saw Mr. Hemmelgarn last on 10/30/21 and noted plans for left CEA. He also noted a S1/S2 SEM on exam and recommended an TTE in six months--after he had recovered for his CEA procedure.   Per posting, patient is to continue ASA. Mr. Rivenburg was notified.  Creatinine down to 1.38. Last A1c down to 7.0%. Anesthesia team to evaluate on the day of surgery.    VS: BP (!) 173/73   Pulse 75   Temp 36.9 C (Oral)   Resp 18   Ht '5\' 9"'$  (1.753 m)   Wt 98 kg   SpO2 100%   BMI 31.90 kg/m    PROVIDERS: Coral Spikes, DO is PCP Ollen Bowl, MD is PCP at the [facility]. Last visit 09/29/21 and noted plans for left CEA. Jenkins Rouge, MD is  cardiologist Elizbeth Squires, MD is nephrologist [facility]). Last visit 06/20/21. She noted on 04/04/21 Creatinine up to 2.3 and Jardiance stopped and advised to increase fluid intake. Repeat Creatinine 04/11/21 was 1.99 and then further decreased to 1.49 on 06/20/21. He had already recently been taken on Advil which he had been using daily for many years. Continue lisinopril. Follow-up around 4 months recommended.    LABS: Labs reviewed: Acceptable for surgery. Cr 1.38, previously 1.35-1.85 since 09/30/20.  A1c 7.0% and TSH 2.33 on 09/29/21 ([place]).  (all labs ordered are listed, but only abnormal results are displayed)  Labs Reviewed  GLUCOSE, CAPILLARY - Abnormal; Notable for the following components:      Result Value   Glucose-Capillary 145 (*)    All other components within normal limits  CBC - Abnormal; Notable for the following components:   RBC 4.11 (*)    Hemoglobin 12.6 (*)    HCT 36.6 (*)    All other components within normal limits  COMPREHENSIVE METABOLIC PANEL - Abnormal; Notable for the following components:   Sodium 134 (*)    Glucose, Bld 153 (*)    Creatinine, Ser 1.38 (*)    GFR, Estimated 52 (*)    All other components within normal limits  URINALYSIS, ROUTINE W REFLEX MICROSCOPIC - Abnormal; Notable for the following components:   APPearance HAZY (*)    Protein, ur 100 (*)  Leukocytes,Ua SMALL (*)    Bacteria, UA RARE (*)    All other components within normal limits  SURGICAL PCR SCREEN  PROTIME-INR  APTT  TYPE AND SCREEN     IMAGES: CTA Neck 08/22/21: IMPRESSION: 1. Up to 80% stenosis proximal left ICA. 2. Moderate to severe stenosis in the left cavernous ICA. 3. No other hemodynamically significant stenosis in the neck or imaged intracranial vasculature.  US Renal 07/14/21: IMPRESSION: 1. No hydronephrosis. 2. Increased echogenicity of the liver as can be seen in hepatic steatosis.   EKG: 10/30/21: Sinus rhythm with  first-degree AV block.  Possible inferior infarct, age undetermined.   CV: US Carotid 07/11/21: IMPRESSION: Right: Color duplex indicates minimal heterogeneous plaque, with no hemodynamically significant stenosis by duplex criteria in the extracranial cerebrovascular circulation.   Left: Heterogeneous plaque at the left carotid bifurcation, with discordant results regarding degree of stenosis by established duplex criteria. Peak velocity suggests 70% - 99% stenosis, with the ICA/ CCA ratio suggesting a lesser degree of stenosis. If establishing a more accurate degree of stenosis is required, cerebral angiogram should be considered, or as a second best test, CTA.   CT Cardiac Calcium Score 04/01/20: FINDINGS: - Non-cardiac: See separate report from [facility]. - Aorta: Normal size. Mild atherosclerosis aortic valve annulus, arch as well as descending aorta. - Pericardium: Normal - Coronary arteries: Normal origins. Mild RCA, LM and LAD calcifications.   IMPRESSION: 1. Coronary calcium score of 42. This was 25 percentile for age and sex matched control. 2.  Aortic atherosclerosis.  Past Medical History:  Diagnosis Date   Adenomatous colon polyp 11/22/2008   Dr. Gala Romney   Allergy    Carotid artery stenosis    Chronic back pain    CKD (chronic kidney disease)    DDD (degenerative disc disease), lumbar    GERD (gastroesophageal reflux disease)    Heart murmur 2023   Hemorrhoids, external    High cholesterol    History of kidney stones 2013   HTN (hypertension)    Hyperplastic colon polyp 11/22/2008   Dr. Gala Romney   Hypothyroidism    Kidney stone    "14 years ago" per pt   Meniere's disease    Meniere's disease    Pneumonia 1965   PTSD (post-traumatic stress disorder)    Tachyarrhythmia    Type 2 diabetes mellitus ([place])     Past Surgical History:  Procedure Laterality Date   CATARACT EXTRACTION W/PHACO Left 09/28/2016   Procedure: CATARACT EXTRACTION  PHACO AND INTRAOCULAR LENS PLACEMENT ([place]);  Surgeon: Baruch Goldmann, MD;  Location: [facility] ORS;  Service: Ophthalmology;  Laterality: Left;  CDE: 4.40   CATARACT EXTRACTION W/PHACO Right 10/26/2016   Procedure: CATARACT EXTRACTION PHACO AND INTRAOCULAR LENS PLACEMENT RIGHT EYE;  Surgeon: Baruch Goldmann, MD;  Location: [facility] ORS;  Service: Ophthalmology;  Laterality: Right;  CDE: 6.74   CHOLECYSTECTOMY     COLONOSCOPY  12/10/2008   Dr. Gala Romney- L side diverticula, adenomatous polyp, hyperplastic polyp   COLONOSCOPY  11/16/2003   Dr. Laural Golden- performed exam to cecum,3 polyps- no path report available, external hemorrhoids,    COLONOSCOPY N/A 01/04/2014   Procedure: COLONOSCOPY;  Surgeon: Daneil Dolin, MD;  Location: [facility] ENDO SUITE;  Service: Endoscopy;  Laterality: N/A;  8:30am   COLONOSCOPY N/A 02/20/2017   Surgeon: Daneil Dolin, MD; multiple colon polyps ranging 3 to 9 mm in size.  Pathology with tubular adenomas.  Recommended repeat in 3 years.   COLONOSCOPY WITH PROPOFOL N/A 10/03/2020  Procedure: COLONOSCOPY WITH PROPOFOL;  Surgeon: Daneil Dolin, MD;  Location: [facility];  Service: Endoscopy;  Laterality: N/A;  8:30am   ESOPHAGOGASTRODUODENOSCOPY  08/31/2011   EEF:EOFHQR esophageal erosions consistent with mild erosive reflux esophagitis   MOUTH SURGERY     POLYPECTOMY  10/03/2020   Procedure: POLYPECTOMY;  Surgeon: Daneil Dolin, MD;  Location: [facility];  Service: Endoscopy;;    MEDICATIONS:  acetaminophen (TYLENOL) 325 MG tablet   albuterol (VENTOLIN HFA) 108 (90 Base) MCG/ACT inhaler   Ascorbic Acid (VITAMIN C) 1000 MG tablet   aspirin 81 MG tablet   b complex vitamins capsule   Cholecalciferol (VITAMIN D3) 125 MCG (5000 UT) CAPS   diazepam (VALIUM) 2 MG tablet   diclofenac Sodium (VOLTAREN) 1 % GEL   DULoxetine (CYMBALTA) 60 MG capsule   empagliflozin (JARDIANCE) 10 MG TABS tablet   enalapril (VASOTEC) 20 MG tablet   glipiZIDE (GLUCOTROL) 5 MG tablet   hydrALAZINE  (APRESOLINE) 25 MG tablet   levothyroxine (SYNTHROID) 25 MCG tablet   Multiple Vitamin (MULTIVITAMIN WITH MINERALS) TABS   pantoprazole (PROTONIX) 40 MG tablet   PRALUENT 150 MG/ML SOAJ   sodium chloride (OCEAN) 0.65 % SOLN nasal spray   TIADYLT ER 360 MG 24 hr capsule   No current facility-administered medications for this encounter.  He is no longer on Jardiance.    Myra Gianotti, PA-C Surgical Short Stay/Anesthesiology [facility] Phone [phone] [facility] Phone [phone] 11/15/2021 12:58 PM

## 2021-11-15 NOTE — Anesthesia Preprocedure Evaluation (Addendum)
Anesthesia Evaluation  Patient identified by MRN, date of birth, ID band Patient awake    Reviewed: Allergy & Precautions, H&P , NPO status , Patient's Chart, lab work & pertinent test results  Airway Mallampati: II   Neck ROM: full    Dental   Pulmonary former smoker,    breath sounds clear to auscultation       Cardiovascular hypertension,  Rhythm:regular Rate:Normal     Neuro/Psych PSYCHIATRIC DISORDERS Anxiety  Neuromuscular disease    GI/Hepatic GERD  ,  Endo/Other  diabetesHypothyroidism   Renal/GU Renal InsufficiencyRenal diseasestones     Musculoskeletal  (+) Arthritis ,   Abdominal   Peds  Hematology   Anesthesia Other Findings   Reproductive/Obstetrics                           Anesthesia Physical Anesthesia Plan  ASA: 3  Anesthesia Plan: General   Post-op Pain Management:    Induction: Intravenous  PONV Risk Score and Plan: 2 and Ondansetron, Dexamethasone, Midazolam and Treatment may vary due to age or medical condition  Airway Management Planned: Oral ETT  Additional Equipment: Arterial line  Intra-op Plan:   Post-operative Plan: Extubation in OR  Informed Consent: I have reviewed the patients History and Physical, chart, labs and discussed the procedure including the risks, benefits and alternatives for the proposed anesthesia with the patient or authorized representative who has indicated his/her understanding and acceptance.     Dental advisory given  Plan Discussed with: CRNA, Anesthesiologist and Surgeon  Anesthesia Plan Comments: (PAT note written 11/15/2021 by Myra Gianotti, PA-C. )       Anesthesia Quick Evaluation

## 2021-11-17 ENCOUNTER — Encounter: Payer: Self-pay | Admitting: *Deleted

## 2021-11-20 ENCOUNTER — Other Ambulatory Visit: Payer: Self-pay

## 2021-11-20 ENCOUNTER — Encounter (HOSPITAL_COMMUNITY): Payer: Self-pay | Admitting: Vascular Surgery

## 2021-11-20 ENCOUNTER — Inpatient Hospital Stay (HOSPITAL_COMMUNITY): Payer: Medicare Other | Admitting: Vascular Surgery

## 2021-11-20 ENCOUNTER — Inpatient Hospital Stay (HOSPITAL_COMMUNITY): Payer: Medicare Other

## 2021-11-20 ENCOUNTER — Inpatient Hospital Stay (HOSPITAL_COMMUNITY)
Admission: RE | Admit: 2021-11-20 | Discharge: 2021-11-22 | DRG: 039 | Disposition: A | Discharge status: Home / Self Care | Payer: Medicare Other | Attending: Vascular Surgery | Admitting: Vascular Surgery

## 2021-11-20 ENCOUNTER — Encounter (HOSPITAL_COMMUNITY)
Admission: RE | Disposition: A | Discharge status: Home / Self Care | Payer: Self-pay | Source: Home / Self Care | Attending: Vascular Surgery

## 2021-11-20 DIAGNOSIS — I10 Essential (primary) hypertension: Secondary | ICD-10-CM

## 2021-11-20 DIAGNOSIS — I6529 Occlusion and stenosis of unspecified carotid artery: Secondary | ICD-10-CM | POA: Diagnosis present

## 2021-11-20 DIAGNOSIS — N1831 Chronic kidney disease, stage 3a: Secondary | ICD-10-CM | POA: Diagnosis present

## 2021-11-20 DIAGNOSIS — R29810 Facial weakness: Secondary | ICD-10-CM | POA: Diagnosis not present

## 2021-11-20 DIAGNOSIS — H8109 Meniere's disease, unspecified ear: Secondary | ICD-10-CM | POA: Diagnosis present

## 2021-11-20 DIAGNOSIS — F4310 Post-traumatic stress disorder, unspecified: Secondary | ICD-10-CM | POA: Diagnosis present

## 2021-11-20 DIAGNOSIS — I129 Hypertensive chronic kidney disease with stage 1 through stage 4 chronic kidney disease, or unspecified chronic kidney disease: Secondary | ICD-10-CM | POA: Diagnosis present

## 2021-11-20 DIAGNOSIS — Z888 Allergy status to other drugs, medicaments and biological substances status: Secondary | ICD-10-CM

## 2021-11-20 DIAGNOSIS — I639 Cerebral infarction, unspecified: Secondary | ICD-10-CM | POA: Diagnosis not present

## 2021-11-20 DIAGNOSIS — F411 Generalized anxiety disorder: Secondary | ICD-10-CM | POA: Diagnosis present

## 2021-11-20 DIAGNOSIS — Z8249 Family history of ischemic heart disease and other diseases of the circulatory system: Secondary | ICD-10-CM

## 2021-11-20 DIAGNOSIS — K219 Gastro-esophageal reflux disease without esophagitis: Secondary | ICD-10-CM | POA: Diagnosis present

## 2021-11-20 DIAGNOSIS — Z7982 Long term (current) use of aspirin: Secondary | ICD-10-CM

## 2021-11-20 DIAGNOSIS — E039 Hypothyroidism, unspecified: Secondary | ICD-10-CM | POA: Diagnosis present

## 2021-11-20 DIAGNOSIS — R4702 Dysphasia: Secondary | ICD-10-CM | POA: Diagnosis not present

## 2021-11-20 DIAGNOSIS — G252 Other specified forms of tremor: Secondary | ICD-10-CM | POA: Diagnosis not present

## 2021-11-20 DIAGNOSIS — H9190 Unspecified hearing loss, unspecified ear: Secondary | ICD-10-CM | POA: Diagnosis present

## 2021-11-20 DIAGNOSIS — Z87891 Personal history of nicotine dependence: Secondary | ICD-10-CM

## 2021-11-20 DIAGNOSIS — E1140 Type 2 diabetes mellitus with diabetic neuropathy, unspecified: Secondary | ICD-10-CM | POA: Diagnosis present

## 2021-11-20 DIAGNOSIS — R471 Dysarthria and anarthria: Secondary | ICD-10-CM | POA: Diagnosis not present

## 2021-11-20 DIAGNOSIS — I6522 Occlusion and stenosis of left carotid artery: Secondary | ICD-10-CM

## 2021-11-20 DIAGNOSIS — Z885 Allergy status to narcotic agent status: Secondary | ICD-10-CM

## 2021-11-20 DIAGNOSIS — E7800 Pure hypercholesterolemia, unspecified: Secondary | ICD-10-CM | POA: Diagnosis present

## 2021-11-20 DIAGNOSIS — I672 Cerebral atherosclerosis: Secondary | ICD-10-CM | POA: Diagnosis present

## 2021-11-20 DIAGNOSIS — Z6831 Body mass index (BMI) 31.0-31.9, adult: Secondary | ICD-10-CM | POA: Diagnosis not present

## 2021-11-20 DIAGNOSIS — Z79891 Long term (current) use of opiate analgesic: Secondary | ICD-10-CM

## 2021-11-20 DIAGNOSIS — G8321 Monoplegia of upper limb affecting right dominant side: Secondary | ICD-10-CM | POA: Diagnosis not present

## 2021-11-20 DIAGNOSIS — R4701 Aphasia: Secondary | ICD-10-CM | POA: Diagnosis not present

## 2021-11-20 DIAGNOSIS — E1122 Type 2 diabetes mellitus with diabetic chronic kidney disease: Secondary | ICD-10-CM | POA: Diagnosis present

## 2021-11-20 DIAGNOSIS — Z79890 Hormone replacement therapy (postmenopausal): Secondary | ICD-10-CM

## 2021-11-20 DIAGNOSIS — Z7984 Long term (current) use of oral hypoglycemic drugs: Secondary | ICD-10-CM

## 2021-11-20 DIAGNOSIS — Z7902 Long term (current) use of antithrombotics/antiplatelets: Secondary | ICD-10-CM

## 2021-11-20 DIAGNOSIS — Z79899 Other long term (current) drug therapy: Secondary | ICD-10-CM

## 2021-11-20 DIAGNOSIS — E1165 Type 2 diabetes mellitus with hyperglycemia: Secondary | ICD-10-CM | POA: Diagnosis present

## 2021-11-20 DIAGNOSIS — E669 Obesity, unspecified: Secondary | ICD-10-CM | POA: Diagnosis present

## 2021-11-20 DIAGNOSIS — Z9889 Other specified postprocedural states: Secondary | ICD-10-CM | POA: Diagnosis not present

## 2021-11-20 HISTORY — PX: ENDARTERECTOMY: SHX5162

## 2021-11-20 LAB — GLUCOSE, CAPILLARY
Glucose-Capillary: 128 mg/dL — ABNORMAL HIGH (ref 70–99)
Glucose-Capillary: 158 mg/dL — ABNORMAL HIGH (ref 70–99)
Glucose-Capillary: 210 mg/dL — ABNORMAL HIGH (ref 70–99)
Glucose-Capillary: 211 mg/dL — ABNORMAL HIGH (ref 70–99)
Glucose-Capillary: 259 mg/dL — ABNORMAL HIGH (ref 70–99)
Glucose-Capillary: 221 mg/dL — ABNORMAL HIGH (ref 70–99)

## 2021-11-20 LAB — ABO/RH: ABO/RH(D): B NEG

## 2021-11-20 LAB — POCT ACTIVATED CLOTTING TIME
Activated Clotting Time: 245 seconds
Activated Clotting Time: 239 seconds

## 2021-11-20 SURGERY — ENDARTERECTOMY, CAROTID
Anesthesia: General | Site: Neck | Laterality: Left

## 2021-11-20 MED ORDER — ALBUTEROL SULFATE (2.5 MG/3ML) 0.083% IN NEBU: 3.0000 mL | INHALATION_SOLUTION | Freq: Four times a day (QID) | RESPIRATORY_TRACT | Status: DC | PRN

## 2021-11-20 MED ORDER — PROPOFOL 10 MG/ML IV BOLUS
INTRAVENOUS | Status: DC | PRN
  Administered 2021-11-20: 150 mg via INTRAVENOUS

## 2021-11-20 MED ORDER — OXYCODONE HCL 5 MG PO TABS: 5.0000 mg | ORAL_TABLET | Freq: Once | ORAL | Status: DC | PRN

## 2021-11-20 MED ORDER — HEPARIN SODIUM (PORCINE) 1000 UNIT/ML IJ SOLN
INTRAMUSCULAR | Status: DC | PRN
  Administered 2021-11-20: 9000 [IU] via INTRAVENOUS
  Administered 2021-11-20 (×2): 2000 [IU] via INTRAVENOUS

## 2021-11-20 MED ORDER — GUAIFENESIN-DM 100-10 MG/5ML PO SYRP: 15.0000 mL | ORAL_SOLUTION | ORAL | Status: DC | PRN

## 2021-11-20 MED ORDER — DIAZEPAM 2 MG PO TABS
2.0000 mg | ORAL_TABLET | Freq: Every day | ORAL | Status: DC
  Administered 2021-11-20 – 2021-11-22 (×3): 2 mg via ORAL
  Filled 2021-11-20 (×3): qty 1

## 2021-11-20 MED ORDER — CHLORHEXIDINE GLUCONATE CLOTH 2 % EX PADS: 6.0000 | MEDICATED_PAD | Freq: Once | CUTANEOUS | Status: DC

## 2021-11-20 MED ORDER — MAGNESIUM SULFATE 2 GM/50ML IV SOLN: 2.0000 g | Freq: Every day | INTRAVENOUS | Status: DC | PRN

## 2021-11-20 MED ORDER — CLOPIDOGREL BISULFATE 75 MG PO TABS
75.0000 mg | ORAL_TABLET | Freq: Every day | ORAL | Status: DC
  Administered 2021-11-21 – 2021-11-22 (×2): 75 mg via ORAL
  Filled 2021-11-20 (×2): qty 1

## 2021-11-20 MED ORDER — ASPIRIN 81 MG PO TBEC
81.0000 mg | DELAYED_RELEASE_TABLET | Freq: Every day | ORAL | Status: DC
  Administered 2021-11-21 – 2021-11-22 (×2): 81 mg via ORAL
  Filled 2021-11-20 (×2): qty 1

## 2021-11-20 MED ORDER — PROTAMINE SULFATE 10 MG/ML IV SOLN
INTRAVENOUS | Status: AC
  Filled 2021-11-20: qty 5

## 2021-11-20 MED ORDER — TRAMADOL HCL 50 MG PO TABS
50.0000 mg | ORAL_TABLET | Freq: Four times a day (QID) | ORAL | Status: DC | PRN
  Administered 2021-11-21: 50 mg via ORAL
  Filled 2021-11-20: qty 1

## 2021-11-20 MED ORDER — EPHEDRINE 5 MG/ML INJ
INTRAVENOUS | Status: AC
  Filled 2021-11-20: qty 5

## 2021-11-20 MED ORDER — FENTANYL CITRATE (PF) 250 MCG/5ML IJ SOLN
INTRAMUSCULAR | Status: DC | PRN
  Administered 2021-11-20: 100 ug via INTRAVENOUS
  Administered 2021-11-20: 50 ug via INTRAVENOUS

## 2021-11-20 MED ORDER — FENTANYL CITRATE (PF) 250 MCG/5ML IJ SOLN
INTRAMUSCULAR | Status: AC
  Filled 2021-11-20: qty 5

## 2021-11-20 MED ORDER — ONDANSETRON HCL 4 MG/2ML IJ SOLN
INTRAMUSCULAR | Status: DC | PRN
  Administered 2021-11-20: 4 mg via INTRAVENOUS

## 2021-11-20 MED ORDER — DEXAMETHASONE SODIUM PHOSPHATE 10 MG/ML IJ SOLN
INTRAMUSCULAR | Status: DC | PRN
  Administered 2021-11-20: 4 mg via INTRAVENOUS

## 2021-11-20 MED ORDER — LIDOCAINE HCL (PF) 1 % IJ SOLN
INTRAMUSCULAR | Status: AC
  Filled 2021-11-20: qty 30

## 2021-11-20 MED ORDER — PHENYLEPHRINE HCL-NACL 20-0.9 MG/250ML-% IV SOLN
INTRAVENOUS | Status: DC | PRN
  Administered 2021-11-20: 40 ug/min via INTRAVENOUS
  Administered 2021-11-20: 50 ug/min via INTRAVENOUS

## 2021-11-20 MED ORDER — HYDRALAZINE HCL 20 MG/ML IJ SOLN
5.0000 mg | INTRAMUSCULAR | Status: AC | PRN
  Administered 2021-11-21 (×2): 5 mg via INTRAVENOUS
  Filled 2021-11-20 (×2): qty 1

## 2021-11-20 MED ORDER — SODIUM CHLORIDE 0.9 % IV SOLN: INTRAVENOUS | Status: DC

## 2021-11-20 MED ORDER — GLYCOPYRROLATE PF 0.2 MG/ML IJ SOSY
PREFILLED_SYRINGE | INTRAMUSCULAR | Status: AC
  Filled 2021-11-20: qty 1

## 2021-11-20 MED ORDER — PROTAMINE SULFATE 10 MG/ML IV SOLN
INTRAVENOUS | Status: DC | PRN
  Administered 2021-11-20: 50 mg via INTRAVENOUS

## 2021-11-20 MED ORDER — ACETAMINOPHEN 650 MG RE SUPP: 325.0000 mg | RECTAL | Status: DC | PRN

## 2021-11-20 MED ORDER — SUGAMMADEX SODIUM 200 MG/2ML IV SOLN
INTRAVENOUS | Status: DC | PRN
  Administered 2021-11-20: 200 mg via INTRAVENOUS

## 2021-11-20 MED ORDER — EPHEDRINE SULFATE-NACL 50-0.9 MG/10ML-% IV SOSY
PREFILLED_SYRINGE | INTRAVENOUS | Status: DC | PRN
  Administered 2021-11-20 (×2): 5 mg via INTRAVENOUS
  Administered 2021-11-20: 10 mg via INTRAVENOUS
  Administered 2021-11-20: 5 mg via INTRAVENOUS
  Administered 2021-11-20: 10 mg via INTRAVENOUS
  Administered 2021-11-20: 5 mg via INTRAVENOUS
  Administered 2021-11-20: 10 mg via INTRAVENOUS

## 2021-11-20 MED ORDER — DEXAMETHASONE SODIUM PHOSPHATE 10 MG/ML IJ SOLN
INTRAMUSCULAR | Status: AC
  Filled 2021-11-20: qty 1

## 2021-11-20 MED ORDER — CLOPIDOGREL BISULFATE 300 MG PO TABS
300.0000 mg | ORAL_TABLET | Freq: Once | ORAL | Status: AC
  Administered 2021-11-20: 300 mg via ORAL
  Filled 2021-11-20 (×2): qty 1

## 2021-11-20 MED ORDER — METOPROLOL TARTRATE 5 MG/5ML IV SOLN: 2.0000 mg | INTRAVENOUS | Status: DC | PRN

## 2021-11-20 MED ORDER — GLYCOPYRROLATE PF 0.2 MG/ML IJ SOSY
PREFILLED_SYRINGE | INTRAMUSCULAR | Status: DC | PRN
  Administered 2021-11-20: .2 mg via INTRAVENOUS

## 2021-11-20 MED ORDER — INSULIN ASPART 100 UNIT/ML IJ SOLN: 0.0000 [IU] | INTRAMUSCULAR | Status: DC | PRN

## 2021-11-20 MED ORDER — IOHEXOL 350 MG/ML SOLN
75.0000 mL | Freq: Once | INTRAVENOUS | Status: AC | PRN
  Administered 2021-11-20: 75 mL via INTRAVENOUS

## 2021-11-20 MED ORDER — HEMOSTATIC AGENTS (NO CHARGE) OPTIME
TOPICAL | Status: DC | PRN
  Administered 2021-11-20: 1 via TOPICAL

## 2021-11-20 MED ORDER — LABETALOL HCL 5 MG/ML IV SOLN
INTRAVENOUS | Status: DC | PRN
  Administered 2021-11-20: 5 mg via INTRAVENOUS

## 2021-11-20 MED ORDER — LEVOTHYROXINE SODIUM 25 MCG PO TABS
25.0000 ug | ORAL_TABLET | Freq: Every day | ORAL | Status: DC
  Administered 2021-11-21 – 2021-11-22 (×2): 25 ug via ORAL
  Filled 2021-11-20 (×2): qty 1

## 2021-11-20 MED ORDER — CEFAZOLIN SODIUM-DEXTROSE 2-4 GM/100ML-% IV SOLN
2.0000 g | INTRAVENOUS | Status: AC
  Administered 2021-11-20: 2 g via INTRAVENOUS
  Filled 2021-11-20: qty 100

## 2021-11-20 MED ORDER — DULOXETINE HCL 30 MG PO CPEP
60.0000 mg | ORAL_CAPSULE | Freq: Every day | ORAL | Status: DC
  Administered 2021-11-21 – 2021-11-22 (×2): 60 mg via ORAL
  Filled 2021-11-20 (×2): qty 2

## 2021-11-20 MED ORDER — LIDOCAINE 2% (20 MG/ML) 5 ML SYRINGE
INTRAMUSCULAR | Status: DC | PRN
  Administered 2021-11-20: 60 mg via INTRAVENOUS

## 2021-11-20 MED ORDER — ASPIRIN 300 MG RE SUPP
300.0000 mg | Freq: Four times a day (QID) | RECTAL | Status: DC | PRN
  Administered 2021-11-20: 300 mg via RECTAL
  Filled 2021-11-20 (×2): qty 1

## 2021-11-20 MED ORDER — ACETAMINOPHEN 325 MG PO TABS
325.0000 mg | ORAL_TABLET | ORAL | Status: DC | PRN
  Administered 2021-11-20 – 2021-11-21 (×2): 650 mg via ORAL
  Filled 2021-11-20 (×2): qty 2

## 2021-11-20 MED ORDER — DEXTRAN 40 IN SALINE 10-0.9 % IV SOLN
INTRAVENOUS | Status: AC | PRN
  Administered 2021-11-20: 500 mL

## 2021-11-20 MED ORDER — ONDANSETRON HCL 4 MG/2ML IJ SOLN: 4.0000 mg | Freq: Four times a day (QID) | INTRAMUSCULAR | Status: DC | PRN

## 2021-11-20 MED ORDER — LIDOCAINE 2% (20 MG/ML) 5 ML SYRINGE
INTRAMUSCULAR | Status: AC
  Filled 2021-11-20: qty 5

## 2021-11-20 MED ORDER — POTASSIUM CHLORIDE CRYS ER 20 MEQ PO TBCR: 20.0000 meq | EXTENDED_RELEASE_TABLET | Freq: Every day | ORAL | Status: DC | PRN

## 2021-11-20 MED ORDER — FENTANYL CITRATE (PF) 100 MCG/2ML IJ SOLN: 25.0000 ug | INTRAMUSCULAR | Status: DC | PRN

## 2021-11-20 MED ORDER — HEPARIN 6000 UNIT IRRIGATION SOLUTION
Status: DC | PRN
  Administered 2021-11-20: 1

## 2021-11-20 MED ORDER — HYDRALAZINE HCL 50 MG PO TABS
50.0000 mg | ORAL_TABLET | Freq: Two times a day (BID) | ORAL | Status: DC
  Administered 2021-11-20 – 2021-11-22 (×4): 50 mg via ORAL
  Filled 2021-11-20 (×4): qty 1

## 2021-11-20 MED ORDER — OXYCODONE HCL 5 MG/5ML PO SOLN: 5.0000 mg | Freq: Once | ORAL | Status: DC | PRN

## 2021-11-20 MED ORDER — ENALAPRIL MALEATE 10 MG PO TABS
20.0000 mg | ORAL_TABLET | Freq: Every day | ORAL | Status: DC
  Administered 2021-11-20 – 2021-11-22 (×3): 20 mg via ORAL
  Filled 2021-11-20: qty 8
  Filled 2021-11-20: qty 2
  Filled 2021-11-20: qty 8

## 2021-11-20 MED ORDER — HEPARIN SODIUM (PORCINE) 1000 UNIT/ML IJ SOLN
INTRAMUSCULAR | Status: AC
  Filled 2021-11-20: qty 20

## 2021-11-20 MED ORDER — PHENOL 1.4 % MT LIQD: 1.0000 | OROMUCOSAL | Status: DC | PRN

## 2021-11-20 MED ORDER — HEPARIN 6000 UNIT IRRIGATION SOLUTION
Status: AC
  Filled 2021-11-20: qty 500

## 2021-11-20 MED ORDER — ROCURONIUM BROMIDE 10 MG/ML (PF) SYRINGE
PREFILLED_SYRINGE | INTRAVENOUS | Status: DC | PRN
  Administered 2021-11-20 (×2): 20 mg via INTRAVENOUS
  Administered 2021-11-20: 50 mg via INTRAVENOUS

## 2021-11-20 MED ORDER — SODIUM CHLORIDE 0.9 % IV SOLN: 500.0000 mL | Freq: Once | INTRAVENOUS | Status: DC | PRN

## 2021-11-20 MED ORDER — SODIUM CHLORIDE 0.9 % IV SOLN
0.1500 ug/kg/min | INTRAVENOUS | Status: DC
  Administered 2021-11-20: .05 ug/kg/min via INTRAVENOUS
  Filled 2021-11-20 (×2): qty 1000

## 2021-11-20 MED ORDER — PANTOPRAZOLE SODIUM 40 MG PO TBEC
40.0000 mg | DELAYED_RELEASE_TABLET | Freq: Every day | ORAL | Status: DC
  Administered 2021-11-21 – 2021-11-22 (×2): 40 mg via ORAL
  Filled 2021-11-20 (×2): qty 1

## 2021-11-20 MED ORDER — ALUM & MAG HYDROXIDE-SIMETH 200-200-20 MG/5ML PO SUSP: 15.0000 mL | ORAL | Status: DC | PRN

## 2021-11-20 MED ORDER — ONDANSETRON HCL 4 MG/2ML IJ SOLN
INTRAMUSCULAR | Status: AC
  Filled 2021-11-20: qty 2

## 2021-11-20 MED ORDER — 0.9 % SODIUM CHLORIDE (POUR BTL) OPTIME
TOPICAL | Status: DC | PRN
  Administered 2021-11-20: 2000 mL

## 2021-11-20 MED ORDER — ROCURONIUM BROMIDE 10 MG/ML (PF) SYRINGE
PREFILLED_SYRINGE | INTRAVENOUS | Status: AC
  Filled 2021-11-20: qty 10

## 2021-11-20 MED ORDER — DOCUSATE SODIUM 100 MG PO CAPS
100.0000 mg | ORAL_CAPSULE | Freq: Every day | ORAL | Status: DC
  Administered 2021-11-21: 100 mg via ORAL
  Filled 2021-11-20: qty 1

## 2021-11-20 MED ORDER — CHLORHEXIDINE GLUCONATE 0.12 % MT SOLN
15.0000 mL | Freq: Once | OROMUCOSAL | Status: AC
  Administered 2021-11-20: 15 mL via OROMUCOSAL
  Filled 2021-11-20: qty 15

## 2021-11-20 MED ORDER — CEFAZOLIN SODIUM-DEXTROSE 2-4 GM/100ML-% IV SOLN
2.0000 g | Freq: Three times a day (TID) | INTRAVENOUS | Status: AC
  Administered 2021-11-20 (×2): 2 g via INTRAVENOUS
  Filled 2021-11-20 (×2): qty 100

## 2021-11-20 MED ORDER — LACTATED RINGERS IV SOLN: INTRAVENOUS | Status: DC

## 2021-11-20 MED ORDER — ORAL CARE MOUTH RINSE: 15.0000 mL | Freq: Once | OROMUCOSAL | Status: AC

## 2021-11-20 MED ORDER — INSULIN ASPART 100 UNIT/ML IJ SOLN
0.0000 [IU] | Freq: Three times a day (TID) | INTRAMUSCULAR | Status: DC
  Administered 2021-11-20: 5 [IU] via SUBCUTANEOUS
  Administered 2021-11-21 (×2): 3 [IU] via SUBCUTANEOUS
  Administered 2021-11-21: 5 [IU] via SUBCUTANEOUS
  Administered 2021-11-22: 2 [IU] via SUBCUTANEOUS

## 2021-11-20 MED ORDER — LABETALOL HCL 5 MG/ML IV SOLN
10.0000 mg | INTRAVENOUS | Status: AC | PRN
  Administered 2021-11-20 – 2021-11-21 (×4): 10 mg via INTRAVENOUS
  Filled 2021-11-20 (×3): qty 4

## 2021-11-20 MED ORDER — CHLORHEXIDINE GLUCONATE CLOTH 2 % EX PADS
6.0000 | MEDICATED_PAD | Freq: Every day | CUTANEOUS | Status: DC
  Administered 2021-11-20 – 2021-11-21 (×2): 6 via TOPICAL

## 2021-11-20 MED ORDER — MORPHINE SULFATE (PF) 2 MG/ML IV SOLN: 2.0000 mg | INTRAVENOUS | Status: DC | PRN

## 2021-11-20 MED ORDER — LACTATED RINGERS IV SOLN: INTRAVENOUS | Status: DC | PRN

## 2021-11-20 SURGICAL SUPPLY — 71 items
ADH SKN CLS APL DERMABOND .7 (GAUZE/BANDAGES/DRESSINGS) ×1
AGENT HMST KT MTR STRL THRMB (HEMOSTASIS)
BAG COUNTER SPONGE SURGICOUNT (BAG) ×1 IMPLANT
BAG SPNG CNTER NS LX DISP (BAG) ×1
CANISTER SUCT 3000ML PPV (MISCELLANEOUS) ×1 IMPLANT
CANNULA VESSEL 3MM 2 BLNT TIP (CANNULA) ×1 IMPLANT
CATH ROBINSON RED A/P 18FR (CATHETERS) ×1 IMPLANT
CLIP LIGATING EXTRA MED SLVR (CLIP) ×1 IMPLANT
CLIP LIGATING EXTRA SM BLUE (MISCELLANEOUS) ×1 IMPLANT
COVER PROBE W GEL 5X96 (DRAPES) ×1 IMPLANT
COVER TRANSDUCER ULTRASND GEL (DISPOSABLE) ×1 IMPLANT
DERMABOND ADVANCED .7 DNX12 (GAUZE/BANDAGES/DRESSINGS) ×1 IMPLANT
DRAIN CHANNEL 15F RND FF W/TCR (WOUND CARE) IMPLANT
DRAIN RELI 100 BL SUC LF ST (DRAIN)
ELECT REM PT RETURN 9FT ADLT (ELECTROSURGICAL) ×1
ELECTRODE REM PT RTRN 9FT ADLT (ELECTROSURGICAL) ×1 IMPLANT
EVACUATOR SILICONE 100CC (DRAIN) IMPLANT
GLOVE BIO SURGEON STRL SZ7.5 (GLOVE) ×1 IMPLANT
GLOVE BIOGEL PI IND STRL 7.5 (GLOVE) IMPLANT
GLOVE BIOGEL PI IND STRL 8 (GLOVE) ×1 IMPLANT
GLOVE ECLIPSE 7.0 STRL STRAW (GLOVE) IMPLANT
GLOVE INDICATOR 7.0 STRL GRN (GLOVE) IMPLANT
GLOVE INDICATOR 7.5 STRL GRN (GLOVE) IMPLANT
GLOVE SRG 8 PF TXTR STRL LF DI (GLOVE) ×1 IMPLANT
GLOVE SURG POLYISO LF SZ8 (GLOVE) IMPLANT
GLOVE SURG UNDER POLY LF SZ8 (GLOVE) ×1
GOWN STRL REUS W/ TWL LRG LVL3 (GOWN DISPOSABLE) ×2 IMPLANT
GOWN STRL REUS W/TWL 2XL LVL3 (GOWN DISPOSABLE) ×2 IMPLANT
GOWN STRL REUS W/TWL LRG LVL3 (GOWN DISPOSABLE) ×5
GOWN STRL REUS W/TWL XL LVL4 (GOWN DISPOSABLE) IMPLANT
HEMOSTAT SNOW SURGICEL 2X4 (HEMOSTASIS) IMPLANT
IV ADAPTER SYR DOUBLE MALE LL (MISCELLANEOUS) IMPLANT
IV ADPR TBG 2 MALE LL ART (MISCELLANEOUS)
KIT BASIN OR (CUSTOM PROCEDURE TRAY) ×1 IMPLANT
KIT SHUNT ARGYLE CAROTID ART 6 (VASCULAR PRODUCTS) IMPLANT
KIT TURNOVER KIT B (KITS) ×1 IMPLANT
LOOP VESSEL MINI RED (MISCELLANEOUS) IMPLANT
NDL HYPO 25GX1X1/2 BEV (NEEDLE) IMPLANT
NDL SPNL 20GX3.5 QUINCKE YW (NEEDLE) IMPLANT
NEEDLE HYPO 25GX1X1/2 BEV (NEEDLE) IMPLANT
NEEDLE SPNL 20GX3.5 QUINCKE YW (NEEDLE) IMPLANT
NS IRRIG 1000ML POUR BTL (IV SOLUTION) ×3 IMPLANT
PACK CAROTID (CUSTOM PROCEDURE TRAY) ×1 IMPLANT
PAD ARMBOARD 7.5X6 YLW CONV (MISCELLANEOUS) ×2 IMPLANT
PATCH VASC XENOSURE 1CMX6CM (Vascular Products) ×1 IMPLANT
PATCH VASC XENOSURE 1X6 (Vascular Products) IMPLANT
POSITIONER HEAD DONUT 9IN (MISCELLANEOUS) ×1 IMPLANT
SET WALTER ACTIVATION W/DRAPE (SET/KITS/TRAYS/PACK) ×1 IMPLANT
SHUNT CAROTID BYPASS 10 (VASCULAR PRODUCTS) IMPLANT
SHUNT CAROTID BYPASS 12FRX15.5 (VASCULAR PRODUCTS) IMPLANT
SPONGE SURGIFOAM ABS GEL 100 (HEMOSTASIS) IMPLANT
STOPCOCK 4 WAY LG BORE MALE ST (IV SETS) IMPLANT
SURGIFLO W/THROMBIN 8M KIT (HEMOSTASIS) IMPLANT
SUT ETHILON 3 0 PS 1 (SUTURE) IMPLANT
SUT MNCRL AB 4-0 PS2 18 (SUTURE) ×1 IMPLANT
SUT PROLENE 6 0 BV (SUTURE) ×1 IMPLANT
SUT PROLENE 7 0 BV1 MDA (SUTURE) IMPLANT
SUT PROLENE BLUE 7 0 (SUTURE) IMPLANT
SUT SILK 3 0 (SUTURE)
SUT SILK 3 0 SH CR/8 (SUTURE) IMPLANT
SUT SILK 3-0 18XBRD TIE 12 (SUTURE) IMPLANT
SUT VIC AB 2-0 CT1 27 (SUTURE) ×1
SUT VIC AB 2-0 CT1 TAPERPNT 27 (SUTURE) ×1 IMPLANT
SUT VIC AB 3-0 SH 27 (SUTURE) ×1
SUT VIC AB 3-0 SH 27X BRD (SUTURE) ×1 IMPLANT
SYR 20CC LL (SYRINGE) ×2 IMPLANT
SYR CONTROL 10ML LL (SYRINGE) IMPLANT
TOWEL GREEN STERILE (TOWEL DISPOSABLE) ×1 IMPLANT
TUBE CONNECTING 12X1/4 (SUCTIONS) IMPLANT
TUBING ART PRESS 48 MALE/FEM (TUBING) IMPLANT
WATER STERILE IRR 1000ML POUR (IV SOLUTION) ×1 IMPLANT

## 2021-11-20 NOTE — Transfer of Care (Signed)
Immediate Anesthesia Transfer of Care Note  Patient: Kyle Dunlap  Procedure(s) Performed: LEFT CAROTID ENDARTERECTOMY (Left: Neck)  Patient Location: PACU  Anesthesia Type:General  Level of Consciousness: awake, alert , and oriented  Airway & Oxygen Therapy: Patient Spontanous Breathing and Patient connected to face mask oxygen  Post-op Assessment: Report given to RN and Post -op Vital signs reviewed and stable  Post vital signs: Reviewed and stable  Last Vitals:  Vitals Value Taken Time  BP 134/92 11/20/21 1115  Temp    Pulse 64 11/20/21 1117  Resp 15 11/20/21 1117  SpO2 98 % 11/20/21 1117  Vitals shown include unvalidated device data.  Last Pain:  Vitals:   11/20/21 0611  TempSrc:   PainSc: 0-No pain         Complications: No notable events documented.

## 2021-11-20 NOTE — Anesthesia Procedure Notes (Signed)
Arterial Line Insertion Start/End10/30/2023 7:13 AM, 11/20/2021 7:13 AM Performed by: Albertha Ghee, MD, Genelle Bal, CRNA, CRNA  Patient location: Pre-op. Preanesthetic checklist: patient identified, IV checked, site marked, risks and benefits discussed, surgical consent, monitors and equipment checked, pre-op evaluation, timeout performed and anesthesia consent Lidocaine 1% used for infiltration Left, radial was placed Catheter size: 20 G Hand hygiene performed  and maximum sterile barriers used   Attempts: 1 Procedure performed without using ultrasound guided technique. Following insertion, dressing applied. Post procedure assessment: normal and unchanged  Patient tolerated the procedure well with no immediate complications.

## 2021-11-20 NOTE — Progress Notes (Signed)
Returned from CT, patient back on PACU main monitor.

## 2021-11-20 NOTE — Progress Notes (Signed)
1120 - 1200 in CT with 2 PACU nurses, patient on monitor, stroke team, Dr. Virl Cagey (Attending), Dr. Truman Hayward (Neurology),

## 2021-11-20 NOTE — Progress Notes (Signed)
Dr. Virl Cagey at bedside with patient, directly from [place]. Pt with slurred speech, unequal hand grasps, unable to grasp with right hand.

## 2021-11-20 NOTE — Progress Notes (Signed)
Code stroke called per Dr. Virl Cagey. Dr. Marcie Bal notified

## 2021-11-20 NOTE — Consult Note (Deleted)
[facility]  Patient name: Kyle Dunlap MRN: [id number] DOB: May 18, 1945 Sex: male  Interval H&P  HPI: Kyle Dunlap is a 76 y.o. 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  He had had a prior ultrasound suggesting moderate disease and recently had follow-up duplex suggesting progression to critical stenosis in his left internal carotid artery.  He is right-handed.  He specifically denies any history of aphasia, amaurosis, TIA or stroke.  He underwent follow-up CT scan and is here today for discussion of this.  His past medical history as listed below.  He has no history of coronary artery disease.  Past Medical History:  Diagnosis Date   Adenomatous colon polyp 11/22/2008   Dr. Gala Romney   Allergy    Carotid artery stenosis    Chronic back pain    CKD (chronic kidney disease)    DDD (degenerative disc disease), lumbar    GERD (gastroesophageal reflux disease)    Heart murmur 2023   Hemorrhoids, external    High cholesterol    History of kidney stones 2013   HTN (hypertension)    Hyperplastic colon polyp 11/22/2008   Dr. Gala Romney   Hypothyroidism    Kidney stone    "14 years ago" per pt   Meniere's disease    Meniere's disease    Pneumonia 1965   PTSD (post-traumatic stress disorder)    Tachyarrhythmia    Type 2 diabetes mellitus ([place])     Family History  Problem Relation Age of Onset   Heart attack Father    Colon cancer Brother 48    SOCIAL HISTORY: Social History   Socioeconomic History   Marital status: Married    Spouse name: Juliann Pulse   Number of children: 2   Years of education: Not on file   Highest education level: Not on file  Occupational History   Not on file  Tobacco Use   Smoking status: Former    Packs/day: 2.00    Years: 24.00    Total pack years: 48.00    Types: Cigarettes    Quit date: 1985    Years since quitting: 38.8    Smokeless tobacco: Never  Vaping Use   Vaping Use: Never used  Substance and Sexual Activity   Alcohol use: Yes    Comment: occasionally- 4-5 mixed drinks per week.   Drug use: No   Sexual activity: Yes    Birth control/protection: None  Other Topics Concern   Not on file  Social History Narrative   Not on file   Social Determinants of Health   Financial Resource Strain: Low Risk  (10/26/2021)   Overall Financial Resource Strain (CARDIA)    Difficulty of Paying Living Expenses: Not hard at all  Food Insecurity: No Food Insecurity (10/26/2021)   Hunger Vital Sign    Worried About Running Out of Food in the Last Year: Never true    Ran Out of Food in the Last Year: Never true  Transportation Needs: No Transportation Needs (10/26/2021)   PRAPARE - Hydrologist (Medical): No    Lack of Transportation (Non-Medical): No  Physical Activity: Inactive (10/26/2021)   Exercise Vital Sign    Days of Exercise per Week: 0 days    Minutes of Exercise per Session: 0 min  Stress: No Stress Concern Present (10/26/2021)   [place]  Feeling of Stress : Not at all  Social Connections: Moderately Integrated (10/26/2021)   Social Connection and Isolation Panel [NHANES]    Frequency of Communication with Friends and Family: Twice a week    Frequency of Social Gatherings with Friends and Family: Twice a week    Attends Religious Services: More than 4 times per year    Active Member of [employer] or Organizations: No    Attends Archivist Meetings: Never    Marital Status: Married  Human resources officer Violence: Not At Risk (10/26/2021)   Humiliation, Afraid, Rape, and Kick questionnaire    Fear of Current or Ex-Partner: No    Emotionally Abused: No    Physically Abused: No    Sexually Abused: No    Allergies  Allergen Reactions   Atorvastatin Other (See Comments)    Joint and muscle aches, weakness  present   Zetia [Ezetimibe] Other (See Comments)    Elevated liver enzymes   Zocor [Simvastatin] Other (See Comments)    Joint and muscle aches   Codeine Other (See Comments)    Passed out    Current Facility-Administered Medications  Medication Dose Route Frequency Provider Last Rate Last Admin   0.9 %  sodium chloride infusion  500 mL Intravenous Once PRN Dagoberto Ligas, PA-C       0.9 %  sodium chloride infusion   Intravenous Continuous Dagoberto Ligas, PA-C 50 mL/hr at 11/20/21 1509 New Bag at 11/20/21 1509   acetaminophen (TYLENOL) tablet 325-650 mg  325-650 mg Oral Q4H PRN Dagoberto Ligas, PA-C   650 mg at 11/20/21 1503   Or   acetaminophen (TYLENOL) suppository 325-650 mg  325-650 mg Rectal Q4H PRN Dagoberto Ligas, PA-C       albuterol (PROVENTIL) (2.5 MG/3ML) 0.083% nebulizer solution 3 mL  3 mL Inhalation Q6H PRN Dagoberto Ligas, PA-C       alum & mag hydroxide-simeth (MAALOX/MYLANTA) 200-200-20 MG/5ML suspension 15-30 mL  15-30 mL Oral Q2H PRN Dagoberto Ligas, PA-C       [START ON 11/21/2021] aspirin EC tablet 81 mg  81 mg Oral Daily Dagoberto Ligas, PA-C       ceFAZolin (ANCEF) IVPB 2g/100 mL premix  2 g Intravenous Q8H Dagoberto Ligas, PA-C       [START ON 11/21/2021] clopidogrel (PLAVIX) tablet 75 mg  75 mg Oral Daily Dagoberto Ligas, PA-C       diazepam (VALIUM) tablet 2 mg  2 mg Oral Daily Dagoberto Ligas, PA-C   2 mg at 11/20/21 1503   [START ON 11/21/2021] docusate sodium (COLACE) capsule 100 mg  100 mg Oral Daily Dagoberto Ligas, PA-C       [START ON 11/21/2021] DULoxetine (CYMBALTA) DR capsule 60 mg  60 mg Oral Daily Dagoberto Ligas, PA-C       enalapril (VASOTEC) tablet 20 mg  20 mg Oral Daily Dagoberto Ligas, PA-C   20 mg at 11/20/21 1513   guaiFENesin-dextromethorphan (ROBITUSSIN DM) 100-10 MG/5ML syrup 15 mL  15 mL Oral Q4H PRN Dagoberto Ligas, PA-C       hydrALAZINE (APRESOLINE) injection 5 mg  5 mg Intravenous Q20 Min PRN Dagoberto Ligas, PA-C        hydrALAZINE (APRESOLINE) tablet 50 mg  50 mg Oral BID Dagoberto Ligas, PA-C       insulin aspart (novoLOG) injection 0-15 Units  0-15 Units Subcutaneous TID WC Eveland, Matthew, PA-C       labetalol (NORMODYNE) injection 10 mg  10 mg Intravenous Q10 min PRN Eveland,  Rodman Key, PA-C       [START ON 11/21/2021] levothyroxine (SYNTHROID) tablet 25 mcg  25 mcg Oral Q0600 Dagoberto Ligas, PA-C       magnesium sulfate IVPB 2 g 50 mL  2 g Intravenous Daily PRN Dagoberto Ligas, PA-C       metoprolol tartrate (LOPRESSOR) injection 2-5 mg  2-5 mg Intravenous Q2H PRN Dagoberto Ligas, PA-C       morphine (PF) 2 MG/ML injection 2 mg  2 mg Intravenous Q2H PRN Dagoberto Ligas, PA-C       ondansetron (ZOFRAN) injection 4 mg  4 mg Intravenous Q6H PRN Dagoberto Ligas, PA-C       [START ON 11/21/2021] pantoprazole (PROTONIX) EC tablet 40 mg  40 mg Oral Daily Eveland, Matthew, PA-C       phenol (CHLORASEPTIC) mouth spray 1 spray  1 spray Mouth/Throat PRN Dagoberto Ligas, PA-C       potassium chloride SA (KLOR-CON M) CR tablet 20-40 mEq  20-40 mEq Oral Daily PRN Dagoberto Ligas, PA-C       traMADol Veatrice Bourbon) tablet 50 mg  50 mg Oral Q6H PRN Dagoberto Ligas, PA-C        REVIEW OF SYSTEMS:  '[X]'$  denotes positive finding, '[ ]'$  denotes negative finding Cardiac  Comments:  Chest pain or chest pressure:    Shortness of breath upon exertion:    Short of breath when lying flat:    Irregular heart rhythm: x       Vascular    Pain in calf, thigh, or hip brought on by ambulation: x   Pain in feet at night that wakes you up from your sleep:     Blood clot in your veins:    Leg swelling:         Pulmonary    Oxygen at home:    Productive cough:     Wheezing:         Neurologic    Sudden weakness in arms or legs:     Sudden numbness in arms or legs:     Sudden onset of difficulty speaking or slurred speech:    Temporary loss of vision in one eye:     Problems with dizziness:         Gastrointestinal     Blood in stool:     Vomited blood:         Genitourinary    Burning when urinating:     Blood in urine:        Psychiatric    Major depression:         Hematologic    Bleeding problems:    Problems with blood clotting too easily:        Skin    Rashes or ulcers:        Constitutional    Fever or chills:      PHYSICAL EXAM: Vitals:   11/20/21 1330 11/20/21 1345 11/20/21 1400 11/20/21 1415  BP: (!) 151/80 (!) 150/66 (!) 154/61 (!) 153/58  Pulse: 66 65 70 68  Resp: '14 16 13 18  '$ Temp:    97.7 F (36.5 C)  TempSrc:      SpO2: 95% 92% 95% 93%  Weight:      Height:        GENERAL: The patient is a well-nourished male, in no acute distress. The vital signs are documented above. CARDIOVASCULAR: I do not appreciate carotid bruits in his right or left neck.  2+ radial pulses bilaterally PULMONARY:  There is good air exchange  MUSCULOSKELETAL: There are no major deformities or cyanosis. NEUROLOGIC: No focal weakness or paresthesias are detected. SKIN: There are no ulcers or rashes noted. PSYCHIATRIC: The patient has a normal affect.  DATA:  I reviewed his CT images with the patient and his wife present.  This does show critical stenosis in his left internal carotid artery at the bifurcation with no significant disease above in the extracranial internal carotid artery  MEDICAL ISSUES: Had a long discussion with the patient and his wife regarding his severe asymptomatic disease.  This puts him at approximately 5 %/year risk of neurologic event.  I have recommended endarterectomy for reduction of stroke risk.  I explained the procedure in detail and also a 3% risk of stroke. After discussion the risks and benefits, Kyle Dunlap and his wife elected to proceed.   Kyle John MD

## 2021-11-20 NOTE — Consult Note (Signed)
[facility]  Patient name: Kyle Dunlap MRN: [id number] DOB: 04/03/1945 Sex: male  Interval H&P  HPI: Kyle Dunlap is a 76 y.o. 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  He had had a prior ultrasound suggesting moderate disease and recently had follow-up duplex suggesting progression to critical stenosis in his left internal carotid artery.  He is right-handed.  He specifically denies any history of aphasia, amaurosis, TIA or stroke.  He underwent follow-up CT scan and is here today for discussion of this.  His past medical history as listed below.  He has no history of coronary artery disease.  Past Medical History:  Diagnosis Date   Adenomatous colon polyp 11/22/2008   Dr. Gala Romney   Allergy    Carotid artery stenosis    Chronic back pain    CKD (chronic kidney disease)    DDD (degenerative disc disease), lumbar    GERD (gastroesophageal reflux disease)    Heart murmur 2023   Hemorrhoids, external    High cholesterol    History of kidney stones 2013   HTN (hypertension)    Hyperplastic colon polyp 11/22/2008   Dr. Gala Romney   Hypothyroidism    Kidney stone    "14 years ago" per pt   Meniere's disease    Meniere's disease    Pneumonia 1965   PTSD (post-traumatic stress disorder)    Tachyarrhythmia    Type 2 diabetes mellitus ([place])     Family History  Problem Relation Age of Onset   Heart attack Father    Colon cancer Brother 56    SOCIAL HISTORY: Social History   Socioeconomic History   Marital status: Married    Spouse name: Juliann Pulse   Number of children: 2   Years of education: Not on file   Highest education level: Not on file  Occupational History   Not on file  Tobacco Use   Smoking status: Former    Packs/day: 2.00    Years: 24.00    Total pack years: 48.00    Types: Cigarettes    Quit date: 1985    Years since quitting: 38.8    Smokeless tobacco: Never  Vaping Use   Vaping Use: Never used  Substance and Sexual Activity   Alcohol use: Yes    Comment: occasionally- 4-5 mixed drinks per week.   Drug use: No   Sexual activity: Yes    Birth control/protection: None  Other Topics Concern   Not on file  Social History Narrative   Not on file   Social Determinants of Health   Financial Resource Strain: Low Risk  (10/26/2021)   Overall Financial Resource Strain (CARDIA)    Difficulty of Paying Living Expenses: Not hard at all  Food Insecurity: No Food Insecurity (10/26/2021)   Hunger Vital Sign    Worried About Running Out of Food in the Last Year: Never true    Ran Out of Food in the Last Year: Never true  Transportation Needs: No Transportation Needs (10/26/2021)   PRAPARE - Hydrologist (Medical): No    Lack of Transportation (Non-Medical): No  Physical Activity: Inactive (10/26/2021)   Exercise Vital Sign    Days of Exercise per Week: 0 days    Minutes of Exercise per Session: 0 min  Stress: No Stress Concern Present (10/26/2021)   [place]  Feeling of Stress : Not at all  Social Connections: Moderately Integrated (10/26/2021)   Social Connection and Isolation Panel [NHANES]    Frequency of Communication with Friends and Family: Twice a week    Frequency of Social Gatherings with Friends and Family: Twice a week    Attends Religious Services: More than 4 times per year    Active Member of [employer] or Organizations: No    Attends Archivist Meetings: Never    Marital Status: Married  Human resources officer Violence: Not At Risk (10/26/2021)   Humiliation, Afraid, Rape, and Kick questionnaire    Fear of Current or Ex-Partner: No    Emotionally Abused: No    Physically Abused: No    Sexually Abused: No    Allergies  Allergen Reactions   Atorvastatin Other (See Comments)    Joint and muscle aches, weakness  present   Zetia [Ezetimibe] Other (See Comments)    Elevated liver enzymes   Zocor [Simvastatin] Other (See Comments)    Joint and muscle aches   Codeine Other (See Comments)    Passed out    Current Facility-Administered Medications  Medication Dose Route Frequency Provider Last Rate Last Admin   0.9 %  sodium chloride infusion   Intravenous Continuous Broadus John, MD       0.9 % irrigation (POUR BTL)    PRN Broadus John, MD   2,000 mL at 11/20/21 0727   ceFAZolin (ANCEF) IVPB 2g/100 mL premix  2 g Intravenous 30 min Pre-Op Broadus John, MD       Chlorhexidine Gluconate Cloth 2 % PADS 6 each  6 each Topical Once Broadus John, MD       And   Chlorhexidine Gluconate Cloth 2 % PADS 6 each  6 each Topical Once Broadus John, MD       dextran 40% in 0.9% NaCl (DEXTRAN 40 IN NORMAL SALINE) 10-0.9 % solution    Continuous PRN Broadus John, MD   1 mL at 11/20/21 0727   heparin 6000 units / NS 500 mL irrigation    PRN Broadus John, MD   1 Application at 33/29/51 8841   insulin aspart (novoLOG) injection 0-7 Units  0-7 Units Subcutaneous Q2H PRN Albertha Ghee, MD       lactated ringers infusion   Intravenous Continuous Belinda Block, MD   New Bag at 11/20/21 0700   remifentanil (ULTIVA) 1 mg in 50 mL normal saline (20 mcg/ml) Optime  0.15 mcg/kg/min Intravenous Continuous Juanda Chance M, CRNA       Facility-Administered Medications Ordered in Other Encounters  Medication Dose Route Frequency Provider Last Rate Last Admin   lactated ringers infusion   Intravenous Continuous PRN Genelle Bal, CRNA   New Bag at 11/20/21 0700    REVIEW OF SYSTEMS:  '[X]'$  denotes positive finding, '[ ]'$  denotes negative finding Cardiac  Comments:  Chest pain or chest pressure:    Shortness of breath upon exertion:    Short of breath when lying flat:    Irregular heart rhythm: x       Vascular    Pain in calf, thigh, or hip brought on by ambulation: x   Pain in feet  at night that wakes you up from your sleep:     Blood clot in your veins:    Leg swelling:         Pulmonary    Oxygen at home:    Productive cough:  Wheezing:         Neurologic    Sudden weakness in arms or legs:     Sudden numbness in arms or legs:     Sudden onset of difficulty speaking or slurred speech:    Temporary loss of vision in one eye:     Problems with dizziness:         Gastrointestinal    Blood in stool:     Vomited blood:         Genitourinary    Burning when urinating:     Blood in urine:        Psychiatric    Major depression:         Hematologic    Bleeding problems:    Problems with blood clotting too easily:        Skin    Rashes or ulcers:        Constitutional    Fever or chills:      PHYSICAL EXAM: Vitals:   11/20/21 0540  BP: (!) 179/69  Pulse: 68  Resp: 17  Temp: 97.8 F (36.6 C)  TempSrc: Oral  SpO2: 98%  Weight: 95.3 kg  Height: '5\' 9"'$  (1.753 m)    GENERAL: The patient is a well-nourished male, in no acute distress. The vital signs are documented above. CARDIOVASCULAR: I do not appreciate carotid bruits in his right or left neck.  2+ radial pulses bilaterally PULMONARY: There is good air exchange  MUSCULOSKELETAL: There are no major deformities or cyanosis. NEUROLOGIC: No focal weakness or paresthesias are detected. SKIN: There are no ulcers or rashes noted. PSYCHIATRIC: The patient has a normal affect.  DATA:  I reviewed his CT images with the patient and his wife present.  This does show critical stenosis in his left internal carotid artery at the bifurcation with no significant disease above in the extracranial internal carotid artery  MEDICAL ISSUES: Had a long discussion with the patient and his wife regarding his severe asymptomatic disease.  This puts him at approximately 5 %/year risk of neurologic event.  I have recommended endarterectomy for reduction of stroke risk.  I explained the procedure in detail and also  a 3% risk of stroke. After discussion the risks and benefits, Mc and his wife elected to proceed.   Broadus John MD

## 2021-11-20 NOTE — Anesthesia Procedure Notes (Signed)
Procedure Name: Intubation Date/Time: 11/20/2021 8:03 AM  Performed by: Genelle Bal, CRNAPre-anesthesia Checklist: Patient identified, Emergency Drugs available, Suction available and Patient being monitored Patient Re-evaluated:Patient Re-evaluated prior to induction Oxygen Delivery Method: Circle system utilized Preoxygenation: Pre-oxygenation with 100% oxygen Induction Type: IV induction Ventilation: Oral airway inserted - appropriate to patient size, Mask ventilation with difficulty and Two handed mask ventilation required Laryngoscope Size: Miller and 2 Grade View: Grade I Tube type: Oral Tube size: 7.5 mm Number of attempts: 1 Airway Equipment and Method: Stylet and Oral airway Placement Confirmation: ETT inserted through vocal cords under direct vision, positive ETCO2 and breath sounds checked- equal and bilateral Secured at: 22 cm Tube secured with: Tape Dental Injury: Teeth and Oropharynx as per pre-operative assessment

## 2021-11-20 NOTE — Consult Note (Signed)
NEURO HOSPITALIST CONSULT NOTE   Requesting physician: Dr. Virl Cagey  Reason for Consult: Acute onset of aphasia, dysarthria, right facial droop and right arm weakness  History obtained from:  RN, Vascular Surgery attending and Chart     HPI:                                                                                                                                          Kyle Dunlap is an 76 y.o. male with a PMHx of left internal carotid artery stenosis, CKD, DDD, hypercholesterolemia, HTN, hypothyroidism, Meniere's disease, PTSD, and DM2 who underwent left CEA today. Following surgery, he was noted to be acutely weak on his right side with dysarthria, expressive dysphasia and right facial droop. Code Stroke was called and the patient was taken emergently to CT.    Past Medical History:  Diagnosis Date   Adenomatous colon polyp 11/22/2008   Dr. Gala Romney   Allergy    Carotid artery stenosis    Chronic back pain    CKD (chronic kidney disease)    DDD (degenerative disc disease), lumbar    GERD (gastroesophageal reflux disease)    Heart murmur 2023   Hemorrhoids, external    High cholesterol    History of kidney stones 2013   HTN (hypertension)    Hyperplastic colon polyp 11/22/2008   Dr. Gala Romney   Hypothyroidism    Kidney stone    "14 years ago" per pt   Meniere's disease    Meniere's disease    Pneumonia 1965   PTSD (post-traumatic stress disorder)    Tachyarrhythmia    Type 2 diabetes mellitus ([place])     Past Surgical History:  Procedure Laterality Date   CATARACT EXTRACTION W/PHACO Left 09/28/2016   Procedure: CATARACT EXTRACTION PHACO AND INTRAOCULAR LENS PLACEMENT ([place]);  Surgeon: Baruch Goldmann, MD;  Location: [facility];  Service: Ophthalmology;  Laterality: Left;  CDE: 4.40   CATARACT EXTRACTION W/PHACO Right 10/26/2016   Procedure: CATARACT EXTRACTION PHACO AND INTRAOCULAR LENS PLACEMENT RIGHT EYE;  Surgeon: Baruch Goldmann, MD;  Location: [facility];  Service: Ophthalmology;  Laterality: Right;  CDE: 6.74   CHOLECYSTECTOMY     COLONOSCOPY  12/10/2008   Dr. Gala Romney- L side diverticula, adenomatous polyp, hyperplastic polyp   COLONOSCOPY  11/16/2003   Dr. Laural Golden- performed exam to cecum,3 polyps- no path report available, external hemorrhoids,    COLONOSCOPY N/A 01/04/2014   Procedure: COLONOSCOPY;  Surgeon: Daneil Dolin, MD;  Location: [facility];  Service: Endoscopy;  Laterality: N/A;  8:30am   COLONOSCOPY N/A 02/20/2017   Surgeon: Daneil Dolin, MD; multiple colon polyps ranging 3 to 9 mm in size.  Pathology with tubular adenomas.  Recommended repeat in 3 years.   COLONOSCOPY WITH PROPOFOL N/A  10/03/2020   Procedure: COLONOSCOPY WITH PROPOFOL;  Surgeon: Daneil Dolin, MD;  Location: [facility];  Service: Endoscopy;  Laterality: N/A;  8:30am   ESOPHAGOGASTRODUODENOSCOPY  08/31/2011   TXM:IWOEHO esophageal erosions consistent with mild erosive reflux esophagitis   MOUTH SURGERY     POLYPECTOMY  10/03/2020   Procedure: POLYPECTOMY;  Surgeon: Daneil Dolin, MD;  Location: [facility];  Service: Endoscopy;;    Family History  Problem Relation Age of Onset   Heart attack Father    Colon cancer Brother 79           Social History:  reports that he quit smoking about 38 years ago. His smoking use included cigarettes. He has a 48.00 pack-year smoking history. He has never used smokeless tobacco. He reports current alcohol use. He reports that he does not use drugs.  Allergies  Allergen Reactions   Atorvastatin Other (See Comments)    Joint and muscle aches, weakness present   Zetia [Ezetimibe] Other (See Comments)    Elevated liver enzymes   Zocor [Simvastatin] Other (See Comments)    Joint and muscle aches   Codeine Other (See Comments)    Passed out    MEDICATIONS:                                                                                                                     Scheduled:  Chlorhexidine  Gluconate Cloth  6 each Topical Once   And   Chlorhexidine Gluconate Cloth  6 each Topical Once   Continuous:  sodium chloride     lactated ringers     remifentanil (ULTIVA) 1 mg in 50 mL normal saline (20 mcg/ml) Optime Stopped (11/20/21 1021)     ROS:                                                                                                                                       Detailed ROS deferred in the context of dysphasia and acuity of presentation.    Blood pressure (!) 150/64, pulse 69, temperature 97.7 F (36.5 C), resp. rate 12, height '5\' 9"'$  (1.753 m), weight 95.3 kg, SpO2 97 %.   General Examination:  Physical Exam  HEENT-  [place]/AT. Recently placed sutures to left neck.  Lungs- Respirations unlabored Extremities- Warm and well perfused   Neurological Examination Mental Status: Awake with mildly decreased level of alertness. Speech is hypophonic and severely dysarthric with word finding difficulty and significantly increased latencies of his verbal responses, which tend to be 1-2 words only. Phonemic paraphasias noted. Has difficulty repeating a phrase. Mild naming deficit. Able to demonstrate comprehension of all questions and commands, but often requires repetition by examiner. Oriented to the city, year, month and day.   Cranial Nerves: II: PERRL. Visual fields intact bilaterally.   III,IV, VI: EOMI. No nystagmus.  VII: Right facial droop.  VIII: Hearing intact to voice IX,X: Hypophonic XI: Head is midline XII: Midline tongue extension Motor: RUE 4-/5 proximally and distally with decreased tone RLE 4+/5 LUE and LLE 5/5 Sensory: Light touch intact throughout, bilaterally. No extinction to DSS Deep Tendon Reflexes: 2+ and symmetric throughout Plantars: Right: Downgoing   Left: Downgoing Cerebellar: No ataxia disproportionate to weakness Gait:  Deferred  NIHSS: 4   Lab Results: Basic Metabolic Panel: Recent Labs  Lab 11/14/21 1255  NA 134*  K 4.2  CL 103  CO2 24  GLUCOSE 153*  BUN 19  CREATININE 1.38*  CALCIUM 9.0    CBC: Recent Labs  Lab 11/14/21 1255  WBC 8.1  HGB 12.6*  HCT 36.6*  MCV 89.1  PLT 278    Cardiac Enzymes: No results for input(s): "CKTOTAL", "CKMB", "CKMBINDEX", "TROPONINI" in the last 168 hours.  Lipid Panel: No results for input(s): "CHOL", "TRIG", "HDL", "CHOLHDL", "VLDL", "LDLCALC" in the last 168 hours.  Imaging: No results found.   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  - Neurological exam reveals right arm weakness, right facial droop, dysarthria and expressive dysphasia with NIHSS 4.  - CT head: No acute hemorrhage or hypodensity, Remote infarct in the right lentiform nucleus/internal capsule. - CTA of head and neck: No LVO. There is evidence for recent left CEA. 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 Calcified plaque in the carotid siphons resulting in up to moderate stenosis on the left and no greater than mild stenosis on the right. Mild intracranial atherosclerotic disease elsewhere without proximal high-grade stenosis or occlusion. 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  - Other stroke risk factors: HTN, DM2, CKD and hypercholesterolemia. ,   Recommendations: - Discussed risks/benefits of TNKa versus no treatment with the patient's wife by telephone. During the call, Neurology was called back to re-examine the patient given rapid improvement. Exam revealed significant improvement in his dysarthria, fluency, RUE strength and right facial strength.  - Given  that the patient's neurological deficits were improving significantly, decision was made to hold off on TNK.  - Will need MRI brain to assess the extent of the ischemic infarction.  - TTE - Cardiac telemetry.  - PT/OT/Speech - NPO until the patient passes swallow evaluation - HgbA1c, fasting lipid panel - Start ASA when cleared to do so by Vascular Surgery - Frequent neuro checks   Electronically signed: Dr. Kerney Elbe 11/20/2021, 12:14 PM

## 2021-11-20 NOTE — Progress Notes (Signed)
Seen this afternoon, patient felt like he was back at baseline. No significant deficits appreciated.  I will continue to monitor Permissive hypertension  Kyle John MD

## 2021-11-20 NOTE — H&P (Signed)
[facility]  Patient name: Kyle Dunlap MRN: [id number] DOB: Jun 27, 1945 Sex: male  Interval H&P  HPI: Kyle Dunlap is a 76 y.o. 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  He had had a prior ultrasound suggesting moderate disease and recently had follow-up duplex suggesting progression to critical stenosis in Kyle left internal carotid artery.  He is right-handed.  He specifically denies any history of aphasia, amaurosis, TIA or stroke.  He underwent follow-up CT scan and is here today for discussion of this.  Kyle past medical history as listed below.  He has no history of coronary artery disease.  Past Medical History:  Diagnosis Date   Adenomatous colon polyp 11/22/2008   Dr. Gala Romney   Allergy    Carotid artery stenosis    Chronic back pain    CKD (chronic kidney disease)    DDD (degenerative disc disease), lumbar    GERD (gastroesophageal reflux disease)    Heart murmur 2023   Hemorrhoids, external    High cholesterol    History of kidney stones 2013   HTN (hypertension)    Hyperplastic colon polyp 11/22/2008   Dr. Gala Romney   Hypothyroidism    Kidney stone    "14 years ago" per pt   Meniere's disease    Meniere's disease    Pneumonia 1965   PTSD (post-traumatic stress disorder)    Tachyarrhythmia    Type 2 diabetes mellitus ([place])     Family History  Problem Relation Age of Onset   Heart attack Father    Colon cancer Brother 17    SOCIAL HISTORY: Social History   Socioeconomic History   Marital status: Married    Spouse name: Kyle Dunlap   Number of children: 2   Years of education: Not on file   Highest education level: Not on file  Occupational History   Not on file  Tobacco Use   Smoking status: Former    Packs/day: 2.00    Years: 24.00    Total pack years: 48.00    Types: Cigarettes    Quit date: 1985    Years since quitting: 38.8    Smokeless tobacco: Never  Vaping Use   Vaping Use: Never used  Substance and Sexual Activity   Alcohol use: Yes    Comment: occasionally- 4-5 mixed drinks per week.   Drug use: No   Sexual activity: Yes    Birth control/protection: None  Other Topics Concern   Not on file  Social History Narrative   Not on file   Social Determinants of Health   Financial Resource Strain: Low Risk  (10/26/2021)   Overall Financial Resource Strain (CARDIA)    Difficulty of Paying Living Expenses: Not hard at all  Food Insecurity: No Food Insecurity (10/26/2021)   Hunger Vital Sign    Worried About Running Out of Food in the Last Year: Never true    Ran Out of Food in the Last Year: Never true  Transportation Needs: No Transportation Needs (10/26/2021)   PRAPARE - Hydrologist (Medical): No    Lack of Transportation (Non-Medical): No  Physical Activity: Inactive (10/26/2021)   Exercise Vital Sign    Days of Exercise per Week: 0 days    Minutes of Exercise per Session: 0 min  Stress: No Stress Concern Present (10/26/2021)   [place]  Feeling of Stress : Not at all  Social Connections: Moderately Integrated (10/26/2021)   Social Connection and Isolation Panel [NHANES]    Frequency of Communication with Friends and Family: Twice a week    Frequency of Social Gatherings with Friends and Family: Twice a week    Attends Religious Services: More than 4 times per year    Active Member of [employer] or Organizations: No    Attends Archivist Meetings: Never    Marital Status: Married  Human resources officer Violence: Not At Risk (10/26/2021)   Humiliation, Afraid, Rape, and Kick questionnaire    Fear of Current or Ex-Partner: No    Emotionally Abused: No    Physically Abused: No    Sexually Abused: No    Allergies  Allergen Reactions   Atorvastatin Other (See Comments)    Joint and muscle aches, weakness  present   Zetia [Ezetimibe] Other (See Comments)    Elevated liver enzymes   Zocor [Simvastatin] Other (See Comments)    Joint and muscle aches   Codeine Other (See Comments)    Passed out    Current Facility-Administered Medications  Medication Dose Route Frequency Provider Last Rate Last Admin   0.9 %  sodium chloride infusion  500 mL Intravenous Once PRN Dagoberto Ligas, PA-C       0.9 %  sodium chloride infusion   Intravenous Continuous Dagoberto Ligas, PA-C 50 mL/hr at 11/20/21 1509 New Bag at 11/20/21 1509   acetaminophen (TYLENOL) tablet 325-650 mg  325-650 mg Oral Q4H PRN Dagoberto Ligas, PA-C   650 mg at 11/20/21 1503   Or   acetaminophen (TYLENOL) suppository 325-650 mg  325-650 mg Rectal Q4H PRN Dagoberto Ligas, PA-C       albuterol (PROVENTIL) (2.5 MG/3ML) 0.083% nebulizer solution 3 mL  3 mL Inhalation Q6H PRN Dagoberto Ligas, PA-C       alum & mag hydroxide-simeth (MAALOX/MYLANTA) 200-200-20 MG/5ML suspension 15-30 mL  15-30 mL Oral Q2H PRN Dagoberto Ligas, PA-C       [START ON 11/21/2021] aspirin EC tablet 81 mg  81 mg Oral Daily Dagoberto Ligas, PA-C       ceFAZolin (ANCEF) IVPB 2g/100 mL premix  2 g Intravenous Q8H Dagoberto Ligas, PA-C       [START ON 11/21/2021] clopidogrel (PLAVIX) tablet 75 mg  75 mg Oral Daily Dagoberto Ligas, PA-C       diazepam (VALIUM) tablet 2 mg  2 mg Oral Daily Dagoberto Ligas, PA-C   2 mg at 11/20/21 1503   [START ON 11/21/2021] docusate sodium (COLACE) capsule 100 mg  100 mg Oral Daily Dagoberto Ligas, PA-C       [START ON 11/21/2021] DULoxetine (CYMBALTA) DR capsule 60 mg  60 mg Oral Daily Dagoberto Ligas, PA-C       enalapril (VASOTEC) tablet 20 mg  20 mg Oral Daily Dagoberto Ligas, PA-C   20 mg at 11/20/21 1513   guaiFENesin-dextromethorphan (ROBITUSSIN DM) 100-10 MG/5ML syrup 15 mL  15 mL Oral Q4H PRN Dagoberto Ligas, PA-C       hydrALAZINE (APRESOLINE) injection 5 mg  5 mg Intravenous Q20 Min PRN Dagoberto Ligas, PA-C        hydrALAZINE (APRESOLINE) tablet 50 mg  50 mg Oral BID Dagoberto Ligas, PA-C       insulin aspart (novoLOG) injection 0-15 Units  0-15 Units Subcutaneous TID WC Eveland, Matthew, PA-C       labetalol (NORMODYNE) injection 10 mg  10 mg Intravenous Q10 min PRN Eveland,  Rodman Key, PA-C       [START ON 11/21/2021] levothyroxine (SYNTHROID) tablet 25 mcg  25 mcg Oral Q0600 Dagoberto Ligas, PA-C       magnesium sulfate IVPB 2 g 50 mL  2 g Intravenous Daily PRN Dagoberto Ligas, PA-C       metoprolol tartrate (LOPRESSOR) injection 2-5 mg  2-5 mg Intravenous Q2H PRN Dagoberto Ligas, PA-C       morphine (PF) 2 MG/ML injection 2 mg  2 mg Intravenous Q2H PRN Dagoberto Ligas, PA-C       ondansetron (ZOFRAN) injection 4 mg  4 mg Intravenous Q6H PRN Dagoberto Ligas, PA-C       [START ON 11/21/2021] pantoprazole (PROTONIX) EC tablet 40 mg  40 mg Oral Daily Eveland, Matthew, PA-C       phenol (CHLORASEPTIC) mouth spray 1 spray  1 spray Mouth/Throat PRN Dagoberto Ligas, PA-C       potassium chloride SA (KLOR-CON M) CR tablet 20-40 mEq  20-40 mEq Oral Daily PRN Dagoberto Ligas, PA-C       traMADol Veatrice Bourbon) tablet 50 mg  50 mg Oral Q6H PRN Dagoberto Ligas, PA-C        REVIEW OF SYSTEMS:  '[X]'$  denotes positive finding, '[ ]'$  denotes negative finding Cardiac  Comments:  Chest pain or chest pressure:    Shortness of breath upon exertion:    Short of breath when lying flat:    Irregular heart rhythm: x       Vascular    Pain in calf, thigh, or hip brought on by ambulation: x   Pain in feet at night that wakes you up from your sleep:     Blood clot in your veins:    Leg swelling:         Pulmonary    Oxygen at home:    Productive cough:     Wheezing:         Neurologic    Sudden weakness in arms or legs:     Sudden numbness in arms or legs:     Sudden onset of difficulty speaking or slurred speech:    Temporary loss of vision in one eye:     Problems with dizziness:         Gastrointestinal     Blood in stool:     Vomited blood:         Genitourinary    Burning when urinating:     Blood in urine:        Psychiatric    Major depression:         Hematologic    Bleeding problems:    Problems with blood clotting too easily:        Skin    Rashes or ulcers:        Constitutional    Fever or chills:      PHYSICAL EXAM: Vitals:   11/20/21 1330 11/20/21 1345 11/20/21 1400 11/20/21 1415  BP: (!) 151/80 (!) 150/66 (!) 154/61 (!) 153/58  Dunlap: 66 65 70 68  Resp: '14 16 13 18  '$ Temp:    97.7 F (36.5 C)  TempSrc:      SpO2: 95% 92% 95% 93%  Weight:      Height:        GENERAL: The patient is a well-nourished male, in no acute distress. The vital signs are documented above. CARDIOVASCULAR: I do not appreciate carotid bruits in Kyle right or left neck.  2+ radial pulses bilaterally PULMONARY:  There is good air exchange  MUSCULOSKELETAL: There are no major deformities or cyanosis. NEUROLOGIC: No focal weakness or paresthesias are detected. SKIN: There are no ulcers or rashes noted. PSYCHIATRIC: The patient has a normal affect.  DATA:  I reviewed Kyle CT images with the patient and Kyle Dunlap present.  This does show critical stenosis in Kyle left internal carotid artery at the bifurcation with no significant disease above in the extracranial internal carotid artery  MEDICAL ISSUES: Had a long discussion with the patient and Kyle Dunlap regarding Kyle severe asymptomatic disease.  This puts him at approximately 5 %/year risk of neurologic event.  I have recommended endarterectomy for reduction of stroke risk.  I explained the procedure in detail and also a 3% risk of stroke. After discussion the risks and benefits, Kyle Dunlap and Kyle Dunlap elected to proceed.   Kyle John MD

## 2021-11-20 NOTE — Progress Notes (Signed)
  Daily Progress Note  S/p: Left-sided carotid endarterectomy, complicated by right-sided deficits upon reversal of anesthesia.  No stroke called.  CTA demonstrated no lesions, right-sided deficits, aphasia have improved significantly.  Subjective: Patient seen and examined his afternoon.  Feels better.    Objective: Vitals:   11/20/21 1215 11/20/21 1230  BP: (!) 156/62 (!) 155/67  Pulse: 76 65  Resp: 16 15  Temp:    SpO2: 96% 97%    Physical Examination Right arm strength better, symmetric grip strength. Neck without significant hematoma  ASSESSMENT/PLAN:  Status post left carotid endarterectomy with deficits appreciated after the case.   CTA demonstrated no discernible lesion.   Symptoms have improved dramatically, continuing to improve.  Neuro ICU Systolic blood pressure [id number] Swallow test pending    Cassandria Santee MD MS [facility] [phone] 11/20/2021  1:06 PM

## 2021-11-20 NOTE — Code Documentation (Signed)
Stroke Response Nurse Documentation Code Documentation  Kyle Dunlap is a 76 y.o. male came to [facility]  on 10/30 for CEA with known critical asymptomatic carotid stenosis of the left ICA. Code stroke was activated by PACU .   Patient on arrived to PACU post procedure where he was LKW at 0800 upon induction and now complaining of right sided weakness, right facial droop, and mild aphasia.  Stroke team at the bedside after patient activation. Patient to CT with team. NIHSS 4, see documentation for details and code stroke times. Patient with right facial droop, right arm weakness, Expressive aphasia , and dysarthria  on exam. The following imaging was completed:  CT Head and CTA. Patient is not a candidate for IV Thrombolytic due to too mild to treat when weighing risks vs. Benefits of TNK post-procedure. Patient is not a candidate for IR due to no LVO per MD Lindzen.   Care/Plan: q30 until 1230. Q2 NIHSS/VS x12, then q4.   Bedside handoff with Mardene Celeste, PACU RN  Kathrin Greathouse  Stroke Response RN

## 2021-11-20 NOTE — Op Note (Signed)
NAME: Kyle Dunlap    MRN: [id number] DOB: 1945-09-04    DATE OF OPERATION: 11/20/2021  PREOP DIAGNOSIS:    Asymptomatic left internal carotid artery stenosis  POSTOP DIAGNOSIS:    Same  PROCEDURE:    Left-sided carotid endarterectomy  SURGEON: Broadus John  ASSIST: Deitra Mayo, MD  ANESTHESIA: General  EBL: 50 mL  INDICATIONS:    [name] is a 76 y.o. male with history of asymptomatic left internal carotid artery stenosis.  After discussing the risk and benefits of carotid endarterectomy to lower future stroke risk, Kyle Dunlap elected to proceed.  FINDINGS:   Greater than 90% stenosis at the ostium of the left internal carotid artery  TECHNIQUE:   After full informed written consent was obtained from the patient, the patient was brought back to the operating room and placed supine upon the operating table.  Prior to induction, the patient received IV antibiotics.  After obtaining adequate anesthesia, the patient was placed into semi-Fowler position with a shoulder roll in place and the patient's neck slightly hyperextended and rotated away from the surgical site.  The patient was prepped in the standard fashion for a left carotid endarterectomy.  I made an incision anterior to the sternocleidomastoid muscle and dissected down through the subcutaneous tissue.  The platysmas was opened with electrocautery.  Then I dissected down to the internal jugular vein.  This was dissected posteriorly until I obtained visualization of the common carotid artery.  This was dissected out and then an umbilical tape was placed around the common carotid artery and I loosely applied a Rumel tourniquet.    I then dissected in a periadventitial fashion along the common carotid artery up to the bifurcation.  I then identified the external carotid artery and the superior thyroid artery.  A 2-0 silk tie was looped around the superior thyroid artery, and I also dissected out the  external carotid artery and placed a vessel loop around it.  In continuing the dissection to the internal carotid artery, I identified the facial vein.  This was ligated and then transected, giving me improved exposure of the internal carotid artery.  In the process of this dissection, the hypoglossal nerve was identified.  I then dissected out the internal carotid artery until I identified an area of soft tissue in the internal carotid artery.  I dissected slightly distal to this area, and placed a vessel loop.   At this point, we gave the patient a therapeutic bolus of Heparin intravenously (roughly 100 units/kg) and ensured an ACT greater than 250 for the remainder of the case. I waited for the blood preseure to be greater than 139mhg which took significant pressor agents. I clamped the internal carotid artery, external carotid artery and then the common carotid artery.  I then made an arteriotomy in the common carotid artery with a 11 blade, and extended the arteriotomy with a Potts scissor down into the common carotid artery, then I carried the arteriotomy through the bifurcation into the internal carotid artery until I reached an area that was not diseased. The ICA was opened demonstrating excellent, pulsatile back bleeding. I elected not to shunt. Furthermore, there was a cavernous ICA lesion appreciated on CT that I was worried I could dissect or disrupt with shunting.  At this point, I started the endarterectomy in the common carotid artery with a PTechnical brewerand carried this dissection down into the common carotid artery circumferentially.  Then I transected the plaque at  a segment where it was adherent.  I then carried this dissection up into the external carotid artery.  The plaque was extracted by unclamping the external carotid artery and everting the artery.  The dissection was then carried into the internal carotid artery, extracting the remaining portion of the carotid plaque.  I passed  the plaque off the field as a specimen.  I then spent the next 30 minutes removing intimal flaps and loose debris.  Eventually I reached the point where the residual plaque was densely adherent and any further dissection would compromise the integrity of the wall.  After verifying that there was no more loose intimal flaps or debris, I re-interrogated the entirety of this carotid artery.  At this point, I was satisfied that the minimal remaining disease was densely adherent to the wall and wall integrity was intact.  At this point, I then fashioned a bovine pericardial patch for the geometry of this artery and sewed it in place with two running stitch of 6-0 Prolene, one from each end.  Prior to completing this patch angioplasty, I removed the shunt first from the internal carotid artery, from which there was excellent backbleeding, and clamped it.  Then I removed the shunt from the common carotid artery, from which there was excellent antegrade bleeding, and then clamped it.  At this point, I allowed the external carotid artery to backbleed, which was excellent.  Then I instilled heparinized saline and dextran in the patched artery and then completed the patch angioplasty in the usual fashion.  First, I released the clamp on the external carotid artery, then I released it on the common carotid artery.  After waiting a few seconds, I then released it on the internal carotid artery.  I then interrogated this patient's arteries with the continuous Doppler.  The audible waveforms in each artery were consistent with the expected characteristics for each artery.    The Sonosite probe was then sterilely draped and used to interrogate the carotid artery in both longitudinal and transverse views.  There was a small piece of debris flapping in the common carotid I elected to removed. The internal, common, external were reclamped.  An 11 blade was used to open the patch longitudinally.  The small piece of debris was  removed, and the patch was closed using running 6-0 Prolene suture.  Once again, arteries were opened-external, common, internal.  Follow-up ultrasound demonstrated an excellent result, with no debris appreciated in the artery.  Hemostasis was achieved with use of thrombin product and cautery.  The wound was irrigated with saline and heparin reversed with 50 mg of protamine. I then reapproximated the platysma muscle with a running stitch of 3-0 Vicryl.  The skin was then reapproximated with a running subcuticular 4-0 Monocryl stitch.  The skin was then cleaned, dried and Dermabond was used to reinforce the skin closure.    The patient awoke moving all extremities except for the right arm.  A code stroke was called.  While in the CT scanner, deficits began to spontaneously improve.  Please see the follow-up notes regarding the above.   Macie Burows, MD [facility] DATE OF DICTATION:   11/20/2021

## 2021-11-20 NOTE — Discharge Instructions (Signed)
   [facility] [place]  Discharge Instructions   Carotid Endarterectomy (CEA)  Please refer to the following instructions for your post-procedure care. Your surgeon or physician assistant will discuss any changes with you.  Activity  You are encouraged to walk as much as you can. You can slowly return to normal activities but must avoid strenuous activity and heavy lifting until your doctor tell you it's OK. Avoid activities such as vacuuming or swinging a golf club. You can drive after one week if you are comfortable and you are no longer taking prescription pain medications. It is normal to feel tired for serval weeks after your surgery. It is also normal to have difficulty with sleep habits, eating, and bowel movements after surgery. These will go away with time.  Bathing/Showering  You may shower after you come home. Do not soak in a bathtub, hot tub, or swim until the incision heals completely.  Incision Care  Shower every day. Clean your incision with mild soap and water. Pat the area dry with a clean towel. You do not need a bandage unless otherwise instructed. Do not apply any ointments or creams to your incision. You may have skin glue on your incision. Do not peel it off. It will come off on its own in about one week. Your incision may feel thickened and raised for several weeks after your surgery. This is normal and the skin will soften over time. For Men Only: It's OK to shave around the incision but do not shave the incision itself for 2 weeks. It is common to have numbness under your chin that could last for several months.  Diet  Resume your normal diet. There are no special food restrictions following this procedure. A low fat/low cholesterol diet is recommended for all patients with vascular disease. In order to heal from your surgery, it is CRITICAL to get adequate nutrition. Your body requires vitamins, minerals, and protein. Vegetables are the best  source of vitamins and minerals. Vegetables also provide the perfect balance of protein. Processed food has little nutritional value, so try to avoid this.        Medications  Resume taking all of your medications unless your doctor or physician assistant tells you not to. If your incision is causing pain, you may take over-the- counter pain relievers such as acetaminophen (Tylenol). If you were prescribed a stronger pain medication, please be aware these medications can cause nausea and constipation. Prevent nausea by taking the medication with a snack or meal. Avoid constipation by drinking plenty of fluids and eating foods with a high amount of fiber, such as fruits, vegetables, and grains. Do not take Tylenol if you are taking prescription pain medications.  Follow Up  Our office will schedule a follow up appointment 2-3 weeks following discharge.  Please call us immediately for any of the following conditions  Increased pain, redness, drainage (pus) from your incision site. Fever of 101 degrees or higher. If you should develop stroke (slurred speech, difficulty swallowing, weakness on one side of your body, loss of vision) you should call 911 and go to the nearest emergency room.  Reduce your risk of vascular disease:  Stop smoking. If you would like help call [facility] at 1-800-QUIT-NOW (1-800-784-8669) or [place] at 336-586-4000. Manage your cholesterol Maintain a desired weight Control your diabetes Keep your blood pressure down  If you have any questions, please call the office at [phone].   

## 2021-11-20 NOTE — Progress Notes (Signed)
[facility]  Patient name: Kyle Dunlap MRN: [id number] DOB: 1946/01/10 Sex: male  Interval H&P  HPI: Kyle Dunlap is a 76 y.o. 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  He had had a prior ultrasound suggesting moderate disease and recently had follow-up duplex suggesting progression to critical stenosis in his left internal carotid artery.  He is right-handed.  He specifically denies any history of aphasia, amaurosis, TIA or stroke.  He underwent follow-up CT scan and is here today for discussion of this.  His past medical history as listed below.  He has no history of coronary artery disease.  Past Medical History:  Diagnosis Date   Adenomatous colon polyp 11/22/2008   Dr. Gala Romney   Allergy    Carotid artery stenosis    Chronic back pain    CKD (chronic kidney disease)    DDD (degenerative disc disease), lumbar    GERD (gastroesophageal reflux disease)    Heart murmur 2023   Hemorrhoids, external    High cholesterol    History of kidney stones 2013   HTN (hypertension)    Hyperplastic colon polyp 11/22/2008   Dr. Gala Romney   Hypothyroidism    Kidney stone    "14 years ago" per pt   Meniere's disease    Meniere's disease    Pneumonia 1965   PTSD (post-traumatic stress disorder)    Tachyarrhythmia    [place] 2 diabetes mellitus [place])     Family History  Problem Relation Age of Onset   Heart attack Father    Colon cancer Brother 55    SOCIAL HISTORY: Social History   Socioeconomic History   Marital status: Married    Spouse name: Juliann Pulse   Number of children: 2   Years of education: Not on file   Highest education level: Not on file  Occupational History   Not on file  Tobacco Use   Smoking status: Former    Packs/day: 2.00    Years: 24.00    Total pack years: 48.00    Types: Cigarettes    Quit date: 1985    Years since quitting: 38.8    Smokeless tobacco: Never  Vaping Use   Vaping Use: Never used  Substance and Sexual Activity   Alcohol use: Yes    Comment: occasionally- 4-5 mixed drinks per week.   Drug use: No   Sexual activity: Yes    Birth control/protection: None  Other Topics Concern   Not on file  Social History Narrative   Not on file   Social Determinants of Health   Financial Resource Strain: Low Risk  (10/26/2021)   Overall Financial Resource Strain (CARDIA)    Difficulty of Paying Living Expenses: Not hard at all  Food Insecurity: No Food Insecurity (10/26/2021)   Hunger Vital Sign    Worried About Running Out of Food in the Last Year: Never true    Ran Out of Food in the Last Year: Never true  Transportation Needs: No Transportation Needs (10/26/2021)   PRAPARE - Hydrologist (Medical): No    Lack of Transportation (Non-Medical): No  Physical Activity: Inactive (10/26/2021)   Exercise Vital Sign    Days of Exercise per Week: 0 days    Minutes of Exercise per Session: 0 min  Stress: No Stress Concern Present (10/26/2021)   [place]  Feeling of Stress : Not at all  Social Connections: Moderately Integrated (10/26/2021)   Social Connection and Isolation Panel [NHANES]    Frequency of Communication with Friends and Family: Twice a week    Frequency of Social Gatherings with Friends and Family: Twice a week    Attends Religious Services: More than 4 times per year    Active Member of [employer] or Organizations: No    Attends Archivist Meetings: Never    Marital Status: Married  Human resources officer Violence: Not At Risk (10/26/2021)   Humiliation, Afraid, Rape, and Kick questionnaire    Fear of Current or Ex-Partner: No    Emotionally Abused: No    Physically Abused: No    Sexually Abused: No    Allergies  Allergen Reactions   Atorvastatin Other (See Comments)    Joint and muscle aches, weakness  present   Zetia [Ezetimibe] Other (See Comments)    Elevated liver enzymes   Zocor [Simvastatin] Other (See Comments)    Joint and muscle aches   Codeine Other (See Comments)    Passed out    Current Facility-Administered Medications  Medication Dose Route Frequency Provider Last Rate Last Admin   0.9 %  sodium chloride infusion   Intravenous Continuous Broadus John, MD       0.9 % irrigation (POUR BTL)    PRN Broadus John, MD   2,000 mL at 11/20/21 0727   ceFAZolin (ANCEF) IVPB 2g/100 mL premix  2 g Intravenous 30 min Pre-Op Broadus John, MD       Chlorhexidine Gluconate Cloth 2 % PADS 6 each  6 each Topical Once Broadus John, MD       And   Chlorhexidine Gluconate Cloth 2 % PADS 6 each  6 each Topical Once Broadus John, MD       dextran 40% in 0.9% NaCl (DEXTRAN 40 IN NORMAL SALINE) 10-0.9 % solution    Continuous PRN Broadus John, MD   1 mL at 11/20/21 0727   heparin 6000 units / NS 500 mL irrigation    PRN Broadus John, MD   1 Application at 21/19/41 7408   insulin aspart (novoLOG) injection 0-7 Units  0-7 Units Subcutaneous Q2H PRN Albertha Ghee, MD       lactated ringers infusion   Intravenous Continuous Belinda Block, MD   New Bag at 11/20/21 0700   remifentanil (ULTIVA) 1 mg in 50 mL normal saline (20 mcg/ml) Optime  0.15 mcg/kg/min Intravenous Continuous Juanda Chance M, CRNA       Facility-Administered Medications Ordered in Other Encounters  Medication Dose Route Frequency Provider Last Rate Last Admin   lactated ringers infusion   Intravenous Continuous PRN Genelle Bal, CRNA   New Bag at 11/20/21 0700    REVIEW OF SYSTEMS:  '[X]'$  denotes positive finding, '[ ]'$  denotes negative finding Cardiac  Comments:  Chest pain or chest pressure:    Shortness of breath upon exertion:    Short of breath when lying flat:    Irregular heart rhythm: x       Vascular    Pain in calf, thigh, or hip brought on by ambulation: x   Pain in feet  at night that wakes you up from your sleep:     Blood clot in your veins:    Leg swelling:         Pulmonary    Oxygen at home:    Productive cough:  Wheezing:         Neurologic    Sudden weakness in arms or legs:     Sudden numbness in arms or legs:     Sudden onset of difficulty speaking or slurred speech:    Temporary loss of vision in one eye:     Problems with dizziness:         Gastrointestinal    Blood in stool:     Vomited blood:         Genitourinary    Burning when urinating:     Blood in urine:        Psychiatric    Major depression:         Hematologic    Bleeding problems:    Problems with blood clotting too easily:        Skin    Rashes or ulcers:        Constitutional    Fever or chills:      PHYSICAL EXAM: Vitals:   11/20/21 0540  BP: (!) 179/69  Pulse: 68  Resp: 17  Temp: 97.8 F (36.6 C)  TempSrc: Oral  SpO2: 98%  Weight: 95.3 kg  Height: '5\' 9"'$  (1.753 m)    GENERAL: The patient is a well-nourished male, in no acute distress. The vital signs are documented above. CARDIOVASCULAR: I do not appreciate carotid bruits in his right or left neck.  2+ radial pulses bilaterally PULMONARY: There is good air exchange  MUSCULOSKELETAL: There are no major deformities or cyanosis. NEUROLOGIC: No focal weakness or paresthesias are detected. SKIN: There are no ulcers or rashes noted. PSYCHIATRIC: The patient has a normal affect.  DATA:  I reviewed his CT images with the patient and his wife present.  This does show critical stenosis in his left internal carotid artery at the bifurcation with no significant disease above in the extracranial internal carotid artery  MEDICAL ISSUES: Had a long discussion with the patient and his wife regarding his severe asymptomatic disease.  This puts him at approximately 5 %/year risk of neurologic event.  I have recommended endarterectomy for reduction of stroke risk.  I explained the procedure in detail and also  a 3% risk of stroke. After discussion the risks and benefits, Sharief and his wife elected to proceed.   Broadus John MD

## 2021-11-21 ENCOUNTER — Encounter (HOSPITAL_COMMUNITY): Payer: Self-pay | Admitting: Vascular Surgery

## 2021-11-21 ENCOUNTER — Inpatient Hospital Stay (HOSPITAL_COMMUNITY): Payer: Medicare Other

## 2021-11-21 DIAGNOSIS — Z9889 Other specified postprocedural states: Secondary | ICD-10-CM | POA: Diagnosis not present

## 2021-11-21 DIAGNOSIS — I6522 Occlusion and stenosis of left carotid artery: Secondary | ICD-10-CM | POA: Diagnosis not present

## 2021-11-21 LAB — BASIC METABOLIC PANEL
Anion gap: 8 (ref 5–15)
BUN: 19 mg/dL (ref 8–23)
CO2: 24 mmol/L (ref 22–32)
Calcium: 8.4 mg/dL — ABNORMAL LOW (ref 8.9–10.3)
Chloride: 103 mmol/L (ref 98–111)
Creatinine, Ser: 1.42 mg/dL — ABNORMAL HIGH (ref 0.61–1.24)
GFR, Estimated: 51 mL/min — ABNORMAL LOW (ref >60)
Glucose, Bld: 212 mg/dL — ABNORMAL HIGH (ref 70–99)
Potassium: 4.4 mmol/L (ref 3.5–5.1)
Sodium: 135 mmol/L (ref 135–145)

## 2021-11-21 LAB — CBC
HCT: 31 % — ABNORMAL LOW (ref 39.0–52.0)
Hemoglobin: 10.8 g/dL — ABNORMAL LOW (ref 13.0–17.0)
MCH: 31.4 pg (ref 26.0–34.0)
MCHC: 34.8 g/dL (ref 30.0–36.0)
MCV: 90.1 fL (ref 80.0–100.0)
Platelets: 258 10*3/uL (ref 150–400)
RBC: 3.44 MIL/uL — ABNORMAL LOW (ref 4.22–5.81)
RDW: 12.3 % (ref 11.5–15.5)
WBC: 10.1 10*3/uL (ref 4.0–10.5)
nRBC: 0 % (ref 0.0–0.2)

## 2021-11-21 LAB — GLUCOSE, CAPILLARY
Glucose-Capillary: 167 mg/dL — ABNORMAL HIGH (ref 70–99)
Glucose-Capillary: 175 mg/dL — ABNORMAL HIGH (ref 70–99)
Glucose-Capillary: 189 mg/dL — ABNORMAL HIGH (ref 70–99)
Glucose-Capillary: 196 mg/dL — ABNORMAL HIGH (ref 70–99)
Glucose-Capillary: 222 mg/dL — ABNORMAL HIGH (ref 70–99)

## 2021-11-21 MED ORDER — LABETALOL HCL 5 MG/ML IV SOLN
INTRAVENOUS | Status: AC
  Administered 2021-11-21: 20 mg
  Filled 2021-11-21: qty 4

## 2021-11-21 MED ORDER — HYDRALAZINE HCL 20 MG/ML IJ SOLN
5.0000 mg | INTRAMUSCULAR | Status: DC | PRN
  Administered 2021-11-22: 5 mg via INTRAVENOUS
  Filled 2021-11-21: qty 1

## 2021-11-21 MED ORDER — LABETALOL HCL 5 MG/ML IV SOLN
10.0000 mg | INTRAVENOUS | Status: DC | PRN
  Administered 2021-11-21: 10 mg via INTRAVENOUS
  Filled 2021-11-21: qty 4

## 2021-11-21 MED ORDER — CLOPIDOGREL BISULFATE 75 MG PO TABS: 75.0000 mg | ORAL_TABLET | Freq: Every day | ORAL | 2 refills | Status: DC

## 2021-11-21 MED ORDER — TRAMADOL HCL 50 MG PO TABS: 50.0000 mg | ORAL_TABLET | Freq: Four times a day (QID) | ORAL | 0 refills | Status: DC | PRN

## 2021-11-21 NOTE — Evaluation (Signed)
Physical Therapy Evaluation and Discharge Patient Details Name: Kyle Dunlap MRN: [id number] DOB: 19-Jun-1945 Today's Date: 11/21/2021  History of Present Illness  76 y.o. male, who presented 11/21/23 for Left CEA with known critical asymptomatic carotid stenosis of the left ICA. Post-procedure with slurred speech and decr grip RUE with Code Stroke activated. NIHSS 4; CT head no hemorrhage or LVO  Clinical Impression   Patient evaluated by Physical Therapy with no further acute PT needs identified. Able to ambulate on unit with supervision progressing to independent. Balance assessment WNL. OT mentioned in MD note, but noted it was not ordered. OT ordered for UE assessment.  PT is signing off. Thank you for this referral.        Recommendations for follow up therapy are one component of a multi-disciplinary discharge planning process, led by the attending physician.  Recommendations may be updated based on patient status, additional functional criteria and insurance authorization.  Follow Up Recommendations No PT follow up      Assistance Recommended at Discharge None  Patient can return home with the following       Equipment Recommendations None recommended by PT  Recommendations for Other Services  OT consult    Functional Status Assessment Patient has not had a recent decline in their functional status     Precautions / Restrictions Precautions Precautions: None      Mobility  Bed Mobility               General bed mobility comments: up in chair on arrival and returned to chair    Transfers Overall transfer level: Independent Equipment used: None               General transfer comment: from chair and from standard toilet    Ambulation/Gait Ambulation/Gait assistance: Supervision, Independent Gait Distance (Feet): 180 Feet Assistive device: None Gait Pattern/deviations: WFL(Within Functional Limits)   Gait velocity interpretation: 1.31 - 2.62  ft/sec, indicative of limited community ambulator   General Gait Details: able to change velocity, turn head, turn 180 without imbalance  Stairs            Wheelchair Mobility    Modified Rankin (Stroke Patients Only)       Balance Overall balance assessment: Independent (able to stand with feet together, eyes closed x 10 sec, turn 360 <7 steps)                                           Pertinent Vitals/Pain Pain Assessment Pain Assessment: No/denies pain    Home Living Family/patient expects to be discharged to:: Private residence Living Arrangements: Spouse/significant other Available Help at Discharge: Family;Available 24 hours/day Type of Home: House Home Access: Stairs to enter Entrance Stairs-Rails: Psychiatric nurse of Steps: 3   Home Layout: One level Home Equipment: None      Prior Function Prior Level of Function : Independent/Modified Independent                     Hand Dominance   Dominant Hand: Right    Extremity/Trunk Assessment   Upper Extremity Assessment Upper Extremity Assessment: Defer to OT evaluation (noted RUE tremor; reports this is new)    Lower Extremity Assessment Lower Extremity Assessment: Overall WFL for tasks assessed (ankle DF 5/5)    Cervical / Trunk Assessment Cervical / Trunk Assessment: Normal  Communication  Communication: HOH  Cognition Arousal/Alertness: Awake/alert Behavior During Therapy: WFL for tasks assessed/performed Overall Cognitive Status: Within Functional Limits for tasks assessed                                          General Comments General comments (skin integrity, edema, etc.): Wife present. Concerned about RUE tremors (new since admission)    Exercises     Assessment/Plan    PT Assessment Patient does not need any further PT services  PT Problem List         PT Treatment Interventions      PT Goals (Current goals can be  found in the Care Plan section)  Acute Rehab PT Goals PT Goal Formulation: All assessment and education complete, DC therapy    Frequency       Co-evaluation               AM-PAC PT "6 Clicks" Mobility  Outcome Measure Help needed turning from your back to your side while in a flat bed without using bedrails?: None Help needed moving from lying on your back to sitting on the side of a flat bed without using bedrails?: None Help needed moving to and from a bed to a chair (including a wheelchair)?: None Help needed standing up from a chair using your arms (e.g., wheelchair or bedside chair)?: None Help needed to walk in hospital room?: None Help needed climbing 3-5 steps with a railing? : None 6 Click Score: 24    End of Session Equipment Utilized During Treatment: Gait belt Activity Tolerance: Patient tolerated treatment well Patient left: in chair;with call bell/phone within reach;with family/visitor present;with nursing/sitter in room;Other (comment) (transport present for MRI) Nurse Communication: Mobility status PT Visit Diagnosis: Difficulty in walking, not elsewhere classified (R26.2)    Time: 8882-8003 PT Time Calculation (min) (ACUTE ONLY): 20 min   Charges:   PT Evaluation $PT Eval Low Complexity: [place], PT Acute Rehabilitation Services  Office [phone]   Rexanne Mano 11/21/2021, 12:26 PM

## 2021-11-21 NOTE — Progress Notes (Addendum)
STROKE TEAM PROGRESS NOTE   INTERVAL HISTORY His family is at bedside. Pt lying in bed, stated that can not remember what happened yesterday but currently denies any weakness numbness. MRI showed in acute infarct.   Vitals:   11/21/21 0600 11/21/21 0700 11/21/21 0727 11/21/21 0800  BP: (!) 170/66 (!) 142/61    Pulse: 77 68 84 84  Resp: 13 12 (!) 21 11  Temp:   98 F (36.7 C)   TempSrc:   Oral   SpO2: 98% 99% 97% 98%  Weight:      Height:       CBC:  Recent Labs  Lab 11/14/21 1255 11/21/21 0410  WBC 8.1 10.1  HGB 12.6* 10.8*  HCT 36.6* 31.0*  MCV 89.1 90.1  PLT 278 527   Basic Metabolic Panel:  Recent Labs  Lab 11/14/21 1255 11/21/21 0410  NA 134* 135  K 4.2 4.4  CL 103 103  CO2 24 24  GLUCOSE 153* 212*  BUN 19 19  CREATININE 1.38* 1.42*  CALCIUM 9.0 8.4*   Lipid Panel: No results for input(s): "CHOL", "TRIG", "HDL", "CHOLHDL", "VLDL", "LDLCALC" in the last 168 hours. HgbA1c: No results for input(s): "HGBA1C" in the last 168 hours. Urine Drug Screen: No results for input(s): "LABOPIA", "COCAINSCRNUR", "LABBENZ", "AMPHETMU", "THCU", "LABBARB" in the last 168 hours.  Alcohol Level No results for input(s): "ETH" in the last 168 hours.  IMAGING past 24 hours CT ANGIO HEAD NECK W WO CM  Result Date: 11/20/2021 CLINICAL DATA:  Stroke suspected. Recent left carotid endarterectomy, right side weakness and dysarthria EXAM: CT ANGIOGRAPHY HEAD AND NECK TECHNIQUE: Multidetector CT imaging of the head and neck was performed using the standard protocol during bolus administration of intravenous contrast. Multiplanar CT image reconstructions and MIPs were obtained to evaluate the vascular anatomy. Carotid stenosis measurements (when applicable) are obtained utilizing NASCET criteria, using the distal internal carotid diameter as the denominator. RADIATION DOSE REDUCTION: This exam was performed according to the departmental dose-optimization program which includes automated  exposure control, adjustment of the mA and/or kV according to patient size and/or use of iterative reconstruction technique. CONTRAST:  75 cc Omnipaque 350 COMPARISON:  CTA neck 08/22/2021 FINDINGS: CT HEAD FINDINGS Brain: There is no acute intracranial hemorrhage, extra-axial fluid collection, or acute infarct. Parenchymal volume is normal. The ventricles are normal in size. There is a remote infarct in the right lentiform nucleus/internal capsule. Gray-white differentiation is otherwise preserved. There is no mass lesion. There is no mass effect or midline shift. Vascular: There is calcification of the bilateral carotid siphons. No dense vessel is seen Skull: Normal. Negative for fracture or focal lesion. Sinuses/Orbits: The paranasal sinuses are clear. Bilateral lens implants are in place. The globes and orbits are otherwise unremarkable. Other: None. ASPECTS [facility] Stroke Program Early CT Score) - Ganglionic level infarction (caudate, lentiform nuclei, internal capsule, insula, M1-M3 cortex): 7 - Supraganglionic infarction (M4-M6 cortex): 3 Total score (0-10 with 10 being normal): 10 CTA NECK FINDINGS Aortic arch: There is calcified plaque in the imaged aortic arch. The origins of the major branch vessels are patent. The subclavian arteries are patent to the level imaged. Right carotid system: The right common, internal, and external carotid arteries are patent with mild plaque of the bifurcation but no hemodynamically significant stenosis or occlusion. There is no dissection or aneurysm. Left carotid system: The patient is status post same-day left carotid endarterectomy. The common carotid artery is patent. There is improved patency of the proximal internal  carotid artery compared to the preoperative study now with no hemodynamically significant stenosis or occlusion. There is no evidence of dissection or aneurysm. Vertebral arteries: The vertebral arteries are patent, without hemodynamically significant  stenosis or occlusion. There is no dissection or aneurysm. Skeleton: There is no acute osseous abnormality or suspicious osseous lesion. There is no visible canal hematoma. Other neck: There is extensive soft tissue gas throughout the left neck consistent with recent carotid endarterectomy. There is no hematoma or abnormal fluid collection. The soft tissues of the neck are otherwise unremarkable. Upper chest: There is debris in the trachea. There is central bronchial wall thickening there is mild interlobular septal thickening in the lung apices. Review of the MIP images confirms the above findings CTA HEAD FINDINGS Anterior circulation: There is calcified plaque in the carotid siphons resulting in up to moderate stenosis on the left and no more than mild stenosis on the right. The left M1 segment is patent. The distal branches are patent, without high-grade stenosis or occlusion. There is mild narrowing of the superior left M2 branch proximally (10-17). The right M1 segment is patent. The distal branches are patent, without proximal occlusion. There is focal moderate stenosis of the right M2 branch in the sylvian fissure (10-18). The bilateral ACAs are patent with mild atherosclerotic irregularity but no proximal high-grade stenosis or occlusion. There is no aneurysm or AVM. Posterior circulation: The bilateral V4 segments are patent. The basilar artery is patent. The major cerebellar arteries are patent. The left posterior communicating artery is occluded after its origin with reconstitution of flow prior to the P1/P2 junction. The left P1 segment arising from the basilar artery is patent, supplying the distal PCA which is otherwise patent there is a fetal origin of the right PCA the right PCA is patent, without proximal stenosis or occlusion. There is no aneurysm or AVM. Venous sinuses: Patent. Anatomic variants: As above. Review of the MIP images confirms the above findings IMPRESSION: 1. No acute intracranial  pathology.  ASPECTS is 10 2. Remote infarct in the right lentiform nucleus/internal capsule. 3. Status post left carotid endarterectomy with improved patency of the proximal internal carotid artery, without residual hemodynamically significant stenosis, occlusion, or dissection. 4. No emergent large vessel occlusion in the intracranial vasculature. 5. 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 6. Calcified plaque in the carotid siphons resulting in up to moderate stenosis on the left and no greater than mild stenosis on the right. Mild intracranial atherosclerotic disease elsewhere without proximal high-grade stenosis or occlusion. 7. Debris in the trachea with central bronchial wall thickening and interlobular septal thickening in the lung apices suggesting pulmonary edema. Correlate with any signs or symptoms of aspiration. Findings communicated to Dr. Cheral Marker via Shea Evans. At 11:40 a.m. the CTA results were discussed at 11:43 a.m. Electronically Signed   By: Valetta Mole M.D.   On: 11/20/2021 12:01   CT HEAD CODE STROKE WO CONTRAST`  Result Date: 11/20/2021 CLINICAL DATA:  Stroke suspected. Recent left carotid endarterectomy, right side weakness and dysarthria EXAM: CT ANGIOGRAPHY HEAD AND NECK TECHNIQUE: Multidetector CT imaging of the head and neck was performed using the standard protocol during bolus administration of intravenous contrast. Multiplanar CT image reconstructions and MIPs were obtained to evaluate the vascular anatomy. Carotid stenosis measurements (when applicable) are obtained utilizing NASCET criteria, using the distal internal carotid diameter as the denominator. RADIATION DOSE REDUCTION: This exam was performed according to  the departmental dose-optimization program which includes automated exposure control, adjustment of the mA and/or kV according to patient size and/or use of iterative reconstruction  technique. CONTRAST:  75 cc Omnipaque 350 COMPARISON:  CTA neck 08/22/2021 FINDINGS: CT HEAD FINDINGS Brain: There is no acute intracranial hemorrhage, extra-axial fluid collection, or acute infarct. Parenchymal volume is normal. The ventricles are normal in size. There is a remote infarct in the right lentiform nucleus/internal capsule. Gray-white differentiation is otherwise preserved. There is no mass lesion. There is no mass effect or midline shift. Vascular: There is calcification of the bilateral carotid siphons. No dense vessel is seen Skull: Normal. Negative for fracture or focal lesion. Sinuses/Orbits: The paranasal sinuses are clear. Bilateral lens implants are in place. The globes and orbits are otherwise unremarkable. Other: None. ASPECTS [facility] Stroke Program Early CT Score) - Ganglionic level infarction (caudate, lentiform nuclei, internal capsule, insula, M1-M3 cortex): 7 - Supraganglionic infarction (M4-M6 cortex): 3 Total score (0-10 with 10 being normal): 10 CTA NECK FINDINGS Aortic arch: There is calcified plaque in the imaged aortic arch. The origins of the major branch vessels are patent. The subclavian arteries are patent to the level imaged. Right carotid system: The right common, internal, and external carotid arteries are patent with mild plaque of the bifurcation but no hemodynamically significant stenosis or occlusion. There is no dissection or aneurysm. Left carotid system: The patient is status post same-day left carotid endarterectomy. The common carotid artery is patent. There is improved patency of the proximal internal carotid artery compared to the preoperative study now with no hemodynamically significant stenosis or occlusion. There is no evidence of dissection or aneurysm. Vertebral arteries: The vertebral arteries are patent, without hemodynamically significant stenosis or occlusion. There is no dissection or aneurysm. Skeleton: There is no acute osseous abnormality or  suspicious osseous lesion. There is no visible canal hematoma. Other neck: There is extensive soft tissue gas throughout the left neck consistent with recent carotid endarterectomy. There is no hematoma or abnormal fluid collection. The soft tissues of the neck are otherwise unremarkable. Upper chest: There is debris in the trachea. There is central bronchial wall thickening there is mild interlobular septal thickening in the lung apices. Review of the MIP images confirms the above findings CTA HEAD FINDINGS Anterior circulation: There is calcified plaque in the carotid siphons resulting in up to moderate stenosis on the left and no more than mild stenosis on the right. The left M1 segment is patent. The distal branches are patent, without high-grade stenosis or occlusion. There is mild narrowing of the superior left M2 branch proximally (10-17). The right M1 segment is patent. The distal branches are patent, without proximal occlusion. There is focal moderate stenosis of the right M2 branch in the sylvian fissure (10-18). The bilateral ACAs are patent with mild atherosclerotic irregularity but no proximal high-grade stenosis or occlusion. There is no aneurysm or AVM. Posterior circulation: The bilateral V4 segments are patent. The basilar artery is patent. The major cerebellar arteries are patent. The left posterior communicating artery is occluded after its origin with reconstitution of flow prior to the P1/P2 junction. The left P1 segment arising from the basilar artery is patent, supplying the distal PCA which is otherwise patent there is a fetal origin of the right PCA the right PCA is patent, without proximal stenosis or occlusion. There is no aneurysm or AVM. Venous sinuses: Patent. Anatomic variants: As above. Review of the MIP images confirms the above findings IMPRESSION: 1. No acute intracranial  pathology.  ASPECTS is 10 2. Remote infarct in the right lentiform nucleus/internal capsule. 3. Status post  left carotid endarterectomy with improved patency of the proximal internal carotid artery, without residual hemodynamically significant stenosis, occlusion, or dissection. 4. No emergent large vessel occlusion in the intracranial vasculature. 5. 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 6. Calcified plaque in the carotid siphons resulting in up to moderate stenosis on the left and no greater than mild stenosis on the right. Mild intracranial atherosclerotic disease elsewhere without proximal high-grade stenosis or occlusion. 7. Debris in the trachea with central bronchial wall thickening and interlobular septal thickening in the lung apices suggesting pulmonary edema. Correlate with any signs or symptoms of aspiration. Findings communicated to Dr. Cheral Marker via Shea Evans. At 11:40 a.m. the CTA results were discussed at 11:43 a.m. Electronically Signed   By: Valetta Mole M.D.   On: 11/20/2021 12:01    PHYSICAL EXAM   Physical Exam  HEENT-  [place]/AT. Recently placed sutures to left neck.  Lungs- Respirations unlabored Extremities- Warm and well perfused     Neurological Examination Mental Status: Awake alert, no speech difficulty.  Oriented to the city, year, month and day.   Cranial Nerves: II: PERRL. Visual fields intact bilaterally.   III,IV, VI: EOMI. No nystagmus.  VII: Right facial droop.  VIII: Hearing intact to voice IX,X: normal XI: Head is midline XII: Midline tongue extension Motor: RUE 4-/5 proximally and distally with decreased tone RLE 4+/5 LUE and LLE 5/5 Sensory: Light touch intact throughout, bilaterally. No extinction to DSS Deep Tendon Reflexes: 2+ and symmetric throughout Plantars: Right: Downgoing                           Left: Downgoing Cerebellar: No ataxia  Gait: Deferred  ASSESSMENT/PLAN SEBASTIAN LURZ is an 76 y.o. male with a PMHx of left internal carotid artery stenosis, CKD,  DDD, hypercholesterolemia, HTN, hypothyroidism, Meniere's disease, PTSD, and DM2 who underwent left CEA today. Following surgery, he was noted to be acutely weak on his right side with dysarthria, expressive dysphasia and right facial droop. Code Stroke was called and the patient was taken emergently to CT.    Stroke like symptoms s/p left CEA - likely due to left hemisphere hypoperfusion associated with CEA procedure  Code Stroke No acute abnormality.  ASPECTS 10.  Chronic right BG/CR infarct CTA head & neck no LVO, left CEA patent, left ICA siphon stenosis. MRI  no acute infarct LDL 121 in 07/2021 HgbA1c 7.7 in 07/2021 VTE prophylaxis - SCD's aspirin 81 mg daily and clopidogrel 75 mg daily prior to admission, now on aspirin 81 mg daily and clopidogrel 75 mg daily. Further regimen per [facility] Therapy recommendations:  none Disposition - pending  Left carotid stenosis Carotid Doppler 07/11/2021 left 70 to 99% stenosis Asymptomatic Status post left CEA yesterday On DAPT Continue follow-up with [facility]  Hypertension Home meds:  hydralazine '25mg'$  twice daily, enalapril 20 mg daily Stable On hydralazine 50 twice daily Long-term BP goal normotensive  Hyperlipidemia Home meds: On Praluent LDL 121, goal < 70 Continue home praluent   Diabetes type II UnControlled Home meds:  jardiance, glipizide HgbA1c 7.7, goal < 7.0 CBGs SSI Close PCP follow-up for better DM control  Other Stroke Risk Factors Advanced Age >/= 92  Obesity, Body mass index is 31.97 kg/m., BMI >/= 30 associated with increased stroke risk,  recommend weight loss, diet and exercise as appropriate   Other Active Problems Mnire's disease PTSD CKD 3A, 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 Pt was seen and examined.   76 year old male with history of hypertension,  hyperlipidemia, diabetes, CKD, PTSD, Mnire's disease, left ICA stenosis 70 to 99% in 06/2021 admitted for left CEA.  Postprocedure patient noticed to have right-sided weakness, slurred speech and right facial droop.  CT negative for acute infarct, but old right BG/CR infarct.  CTA head and neck left ICA bulb patent, left ICA siphon stenosis.  MRI no acute infarct.  LDL 121, A1c 7.7 in 07/2021.  Creatinine 1.42  On exam, wife at bedside.  Patient lying in bed, no complaints.  Awake alert, oriented x3, no focal neurologic deficit except bilateral upper extremity postural tremor.  Etiology for patient strokelike symptoms post CVA not quite clear, however concerning for left brain hypoperfusion during the CEA procedure with some slow recovery after the procedure.  Continue DAPT and home Praluent.  Avoid blood pressure fluctuation, BP goal normotensive.  PT/OT no recommendation.  For detailed assessment and plan, please refer to above/below as I have made changes wherever appropriate.   Neurology will sign off. Please call with questions. Pt will follow up with stroke clinic NP at [facility] in about 4 weeks. Thanks for the consult.   Rosalin Hawking, MD PhD Stroke Neurology 11/21/2021 9:23 PM    To contact Stroke Continuity provider, please refer to [email or web address] After hours, contact General Neurology

## 2021-11-21 NOTE — Anesthesia Postprocedure Evaluation (Signed)
Anesthesia Post Note  Patient: Kyle Dunlap  Procedure(s) Performed: LEFT CAROTID ENDARTERECTOMY (Left: Neck)     Patient location during evaluation: PACU Anesthesia Type: General Level of consciousness: awake and alert Pain management: pain level controlled Vital Signs Assessment: post-procedure vital signs reviewed and stable Respiratory status: spontaneous breathing, nonlabored ventilation, respiratory function stable and patient connected to nasal cannula oxygen Cardiovascular status: blood pressure returned to baseline and stable Postop Assessment: no apparent nausea or vomiting Anesthetic complications: no Comments: While in PACU, pt demonstrated right sided weakness in right arm.  Concern was raised for acute stroke in the setting of carotid endarterectomy.  Code stroke was called. Neurology responded and pt was taken emergently to [facility].     No notable events documented.  Last Vitals:  Vitals:   11/21/21 0727 11/21/21 0800  BP:    Pulse: 84 84  Resp: (!) 21 11  Temp: 36.7 C   SpO2: 97% 98%    Last Pain:  Vitals:   11/21/21 0800  TempSrc:   PainSc: 0-No pain                 Kamyiah Colantonio S

## 2021-11-21 NOTE — Progress Notes (Signed)
Inpatient Diabetes Program Recommendations  AACE/ADA: New Consensus Statement on Inpatient Glycemic Control (2015)  Target Ranges:  Prepandial:   less than 140 mg/dL      Peak postprandial:   less than 180 mg/dL (1-2 hours)      Critically ill patients:  140 - 180 mg/dL   Lab Results  Component Value Date   GLUCAP 189 (H) 11/21/2021   HGBA1C 7.7 (H) 08/11/2021    Review of Glycemic Control  Latest Reference Range & Units 11/20/21 19:51 11/20/21 23:28 11/21/21 03:24 11/21/21 07:25  Glucose-Capillary 70 - 99 mg/dL 259 (H) 221 (H) 196 (H) 189 (H)   Diabetes history: DM  Outpatient Diabetes medications:  Glucotrol 5 mg bid (per note patient not taking Jardiance) Current orders for Inpatient glycemic control:  Novolog 0-15 units tid with meals Inpatient Diabetes Program Recommendations:    Consider adding Semglee 12 units daily.   Thanks,   Adah Perl, RN, BC-ADM Inpatient Diabetes Coordinator Pager [phone]  (8a-5p)

## 2021-11-21 NOTE — Evaluation (Signed)
Occupational Therapy Evaluation Patient Details Name: Kyle Dunlap MRN: [id number] DOB: [birth year] Today's Date: 11/21/2021   History of Present Illness 76 y.o. male, who presented 11/21/23 for Left CEA with known critical asymptomatic carotid stenosis of the left ICA. Post-procedure with slurred speech and decr grip RUE with Code Stroke activated. NIHSS 4; CT head no hemorrhage or LVO. MRI (-) for acute CVA.   Clinical Impression   At baseline pt independent with ADL and mobility. No focal deficits noted, however pt feels his tremors are worse. Tremors at baseline for 1-2 years, which he has not had addressed by MD/neurology. Pt states he has difficulty at times with self feeding due to tremors. Will follow up in am to trial use of AE/compensatory strategies for self feeding. No OT follow up needed.      Recommendations for follow up therapy are one component of a multi-disciplinary discharge planning process, led by the attending physician.  Recommendations may be updated based on patient status, additional functional criteria and insurance authorization.   Follow Up Recommendations  No OT follow up (may follow up with neurology as an outpt for tremors/disucss with PCP)    Assistance Recommended at Discharge Set up Supervision/Assistance  Patient can return home with the following Assist for transportation    Functional Status Assessment  Patient has not had a recent decline in their functional status  Equipment Recommendations  None recommended by OT    Recommendations for Other Services       Precautions / Restrictions Precautions Precautions: None      Mobility Bed Mobility               General bed mobility comments: up in chair on arrival and returned to chair    Transfers Overall transfer level: Independent Equipment used: None               General transfer comment: from chair and from standard toilet      Balance Overall balance assessment:  Independent (able to stand with feet together, eyes closed x 10 sec, turn 360 <7 steps)                                         ADL either performed or assessed with clinical judgement   ADL Overall ADL's : At baseline (however sefl feeding is more difficult duet o tremors; discussed trialing use of weighted utneils/wrist weight and trying lidded cup; finger foods easier to handle)                                             Vision Baseline Vision/History: 0 No visual deficits Vision Assessment?: No apparent visual deficits     Perception     Praxis      Pertinent Vitals/Pain Pain Assessment Pain Assessment: No/denies pain     Hand Dominance Right   Extremity/Trunk Assessment Upper Extremity Assessment Upper Extremity Assessment: RUE deficits/detail;LUE deficits/detail RUE Deficits / Details: hx of R shoulder pain - point to upper trap; intention tremor  present; AROM adn strnegth overall WFL RUE Sensation: WNL RUE Coordination: WNL (fine motor tasks affected by tremor) LUE Deficits / Details: similar to R; no complaints of pain   Lower Extremity Assessment Lower Extremity Assessment: Defer to PT evaluation  Cervical / Trunk Assessment Cervical / Trunk Assessment: Normal   Communication Communication Communication: HOH   Cognition Arousal/Alertness: Awake/alert Behavior During Therapy: Flat affect Overall Cognitive Status: Within Functional Limits for tasks assessed                                 General Comments: wife present and stae he is at bhis baseline cognitively     General Comments  Wife present. Concerned about RUE tremors (new since admission)    Exercises     Shoulder Instructions      Home Living Family/patient expects to be discharged to:: Private residence Living Arrangements: Spouse/significant other Available Help at Discharge: Family;Available 24 hours/day Type of Home: House Home  Access: Stairs to enter [employer] of Steps: 3 Entrance Stairs-Rails: Right;Left Home Layout: One level     Bathroom Shower/Tub: Teacher, early years/pre: Standard     Home Equipment: None          Prior Functioning/Environment Prior Level of Function : Independent/Modified Independent                        OT Problem List: Decreased coordination      OT Treatment/Interventions: Therapeutic exercise;Self-care/ADL training;DME and/or AE instruction;Therapeutic activities;Patient/family education    OT Goals(Current goals can be found in the care plan section) Acute Rehab OT Goals Patient Stated Goal: to stop spilling stuff when he eats OT Goal Formulation: With patient Time For Goal Achievement: 11/28/21 Potential to Achieve Goals: Good  OT Frequency: Min 2X/week    Co-evaluation              AM-PAC OT "6 Clicks" Daily Activity     Outcome Measure Help from another person eating meals?: A Little Help from another person taking care of personal grooming?: None Help from another person toileting, which includes using toliet, bedpan, or urinal?: None Help from another person bathing (including washing, rinsing, drying)?: None Help from another person to put on and taking off regular upper body clothing?: None Help from another person to put on and taking off regular lower body clothing?: None 6 Click Score: 23   End of Session Nurse Communication: Other (comment) (DC needs)  Activity Tolerance: Patient tolerated treatment well Patient left: in chair;with call bell/phone within reach;with family/visitor present  OT Visit Diagnosis: Other (comment) (tremor)                Time: 5929-2446 OT Time Calculation (min): 20 min Charges:  OT General Charges $OT Visit: 1 Visit OT Evaluation $OT Eval Low Complexity: [place], OT/L   Acute OT Clinical Specialist Acute Rehabilitation Services Pager [phone] Office  [phone]   [facility] 11/21/2021, 2:37 PM

## 2021-11-21 NOTE — Progress Notes (Signed)
Patient's SBP increased to 218 despite prn labetalol and apresoline . Dr Unk Lightning informed and one time labetalol dose given per orders .

## 2021-11-21 NOTE — Progress Notes (Signed)
Vascular PA updated to refractory HTN, orders forthcoming for PRNs.

## 2021-11-21 NOTE — Discharge Summary (Signed)
Discharge Summary     Kyle Dunlap 01-24-1945 76 y.o. male  [id number]  Admission Date: 11/20/2021  Discharge Date: 11/22/21  Physician: Broadus John, MD  Admission Diagnosis: Carotid artery stenosis [I65.29]  Discharge Day services:    See progress note 11/22/21  Hospital Course:  Kyle Dunlap is a 76 year old male who was brought in as an outpatient for left carotid endarterectomy by Dr. Virl Cagey on 11/20/2021.  This was performed due to high-grade asymptomatic stenosis of the left ICA.  Unfortunately patient woke up with a right arm deficit.  A code stroke was called.  CT head was negative for acute CVA.  CT angio demonstrated a widely patent carotid endarterectomy site.  Right arm deficit resolved within 1 to 2 hours after surgery.  He however was admitted to the ICU postoperatively.  He did not have any further neurological events.  POD #1 right arm deficit is back to baseline.  He denies any other strokelike symptoms including slurring speech or changes in vision.  He has been started on Plavix in addition to his aspirin.  He was evaluated by PT and OT who did not recommend any ongoing therapy.  He did have some uncontrolled blood pressure postoperative day #1 which showed minimal response to IV medication.  At the recommendation of neurology, he underwent MRI brain which was negative for acute CVA.  He will follow-up with neurology as outpatient.  POD #2 his blood pressure has returned to his baseline which is still somewhat hypertensive.  We recommended short order follow-up with PCP to discuss blood pressure control.  He will also follow-up with Dr. Donnetta Hutching in 2 to 3 weeks for incision check.  He was prescribed 1 to 2 days of narcotic pain medication for continued postoperative pain control.  He was discharged home in stable condition.   Recent Labs    11/21/21 0410  NA 135  K 4.4  CL 103  CO2 24  GLUCOSE 212*  BUN 19  CALCIUM 8.4*   Recent Labs     11/21/21 0410  WBC 10.1  HGB 10.8*  HCT 31.0*  PLT 258   No results for input(s): "INR" in the last 72 hours.     Discharge Diagnosis:  Carotid artery stenosis [I65.29]  Secondary Diagnosis: Patient Active Problem List   Diagnosis Date Noted   Carotid artery stenosis 11/20/2021   Generalized anxiety disorder 10/26/2021   Hypothyroidism 10/26/2021   Type 2 diabetes mellitus with diabetic nephropathy, without long-term current use of insulin ([place]) 11/15/2020   Stage 3b chronic kidney disease ([place]) 11/15/2020   Chronic right-sided low back pain without sciatica 11/15/2020   Diabetic neuropathy ([place]) 11/15/2020   Statin myopathy 05/09/2020   Aortic atherosclerosis ([place]) 05/06/2020   Meniere's disease 03/20/2015   Hyperlipidemia LDL goal <70 07/15/2012   Essential hypertension, benign 07/15/2012   GERD (gastroesophageal reflux disease) 08/03/2011   H/O adenomatous polyp of colon 08/03/2011   Hearing loss 01/22/1998   Past Medical History:  Diagnosis Date   Adenomatous colon polyp 11/22/2008   Dr. Gala Romney   Allergy    Carotid artery stenosis    Chronic back pain    CKD (chronic kidney disease)    DDD (degenerative disc disease), lumbar    GERD (gastroesophageal reflux disease)    Heart murmur 2023   Hemorrhoids, external    High cholesterol    History of kidney stones 2013   HTN (hypertension)    Hyperplastic colon polyp 11/22/2008   Dr.  Rourk   Hypothyroidism    Kidney stone    "14 years ago" per pt   Meniere's disease    Meniere's disease    Pneumonia 1965   PTSD (post-traumatic stress disorder)    Tachyarrhythmia    Type 2 diabetes mellitus ([place])     Allergies as of 11/22/2021       Reactions   Atorvastatin Other (See Comments)   Joint and muscle aches, weakness present   Zetia [ezetimibe] Other (See Comments)   Elevated liver enzymes   Zocor [simvastatin] Other (See Comments)   Joint and muscle aches   Codeine Other (See Comments)   Passed out         Medication List     TAKE these medications    acetaminophen 325 MG tablet Commonly known as: TYLENOL Take 650 mg by mouth every 6 (six) hours as needed for moderate pain.   albuterol 108 (90 Base) MCG/ACT inhaler Commonly known as: VENTOLIN HFA TAKE 2 PUFFS BY MOUTH EVERY 6 HOURS AS NEEDED FOR WHEEZE OR SHORTNESS OF BREATH   aspirin 81 MG tablet Take 81 mg by mouth daily.   b complex vitamins capsule Take 1 capsule by mouth once a week.   clopidogrel 75 MG tablet Commonly known as: PLAVIX Take 1 tablet (75 mg total) by mouth daily.   diazepam 2 MG tablet Commonly known as: VALIUM TAKE ONE TABLET ('2MG'$  TOTAL) BY MOUTH DAILY AS NEEDED FOR DIZZINESS SECONDARY TO MENIERE'S DISEASE What changed:  how much to take how to take this when to take this additional instructions   diclofenac Sodium 1 % Gel Commonly known as: VOLTAREN Apply 2 g topically daily as needed (pain).   DULoxetine 60 MG capsule Commonly known as: CYMBALTA Take 1 capsule (60 mg total) by mouth daily.   empagliflozin 10 MG Tabs tablet Commonly known as: Jardiance Take 1 tablet (10 mg total) by mouth daily before breakfast.   enalapril 20 MG tablet Commonly known as: VASOTEC TAKE 1 TABLET BY MOUTH EVERY DAY   glipiZIDE 5 MG tablet Commonly known as: GLUCOTROL TAKE 2 TABLETS BY MOUTH TWICE A DAY   hydrALAZINE 25 MG tablet Commonly known as: APRESOLINE Take 50 mg by mouth 2 (two) times daily.   levothyroxine 25 MCG tablet Commonly known as: SYNTHROID TAKE ONE TABLET BY MOUTH DAILY FOR HYPOTHYROIDISM   multivitamin with minerals Tabs tablet Take 1 tablet by mouth daily.   pantoprazole 40 MG tablet Commonly known as: PROTONIX TAKE 1 TABLET BY MOUTH EVERY DAY   Praluent 150 MG/ML [place] Generic drug: Alirocumab INJECT 1 PEN INTO THE SKIN EVERY 14 (FOURTEEN) DAYS.   sodium chloride 0.65 % Soln nasal spray Commonly known as: OCEAN Place 1 spray into both nostrils at bedtime.   Tiadylt  ER 360 MG 24 hr capsule Generic drug: diltiazem TAKE 1 CAPSULE BY MOUTH EVERY DAY   traMADol 50 MG tablet Commonly known as: Ultram Take 1 tablet (50 mg total) by mouth every 6 (six) hours as needed.   vitamin C 1000 MG tablet Take 1,000 mg by mouth 3 (three) times a week.   Vitamin D3 125 MCG (5000 UT) Caps Take 5,000 Units by mouth 2 (two) times a week.          Discharge Instructions:   [facility] Discharge Instructions Carotid Endarterectomy (CEA)  Please refer to the following instructions for your post-procedure care. Your surgeon or physician assistant will discuss any changes with you.  Activity  You are encouraged to walk as much as you can. You can slowly return to normal activities but must avoid strenuous activity and heavy lifting until your doctor tell you it's OK. Avoid activities such as vacuuming or swinging a golf club. You can drive after one week if you are comfortable and you are no longer taking prescription pain medications. It is normal to feel tired for serval weeks after your surgery. It is also normal to have difficulty with sleep habits, eating, and bowel movements after surgery. These will go away with time.  Bathing/Showering  You may shower after you come home. Do not soak in a bathtub, hot tub, or swim until the incision heals completely.  Incision Care  Shower every day. Clean your incision with mild soap and water. Pat the area dry with a clean towel. You do not need a bandage unless otherwise instructed. Do not apply any ointments or creams to your incision. You may have skin glue on your incision. Do not peel it off. It will come off on its own in about one week. Your incision may feel thickened and raised for several weeks after your surgery. This is normal and the skin will soften over time. For Men Only: It's OK to shave around the incision but do not shave the incision itself for 2 weeks. It is common to  have numbness under your chin that could last for several months.  Diet  Resume your normal diet. There are no special food restrictions following this procedure. A low fat/low cholesterol diet is recommended for all patients with vascular disease. In order to heal from your surgery, it is CRITICAL to get adequate nutrition. Your body requires vitamins, minerals, and protein. Vegetables are the best source of vitamins and minerals. Vegetables also provide the perfect balance of protein. Processed food has little nutritional value, so try to avoid this.  Medications  Resume taking all of your medications unless your doctor or physician assistant tells you not to.  If your incision is causing pain, you may take over-the- counter pain relievers such as acetaminophen (Tylenol). If you were prescribed a stronger pain medication, please be aware these medications can cause nausea and constipation.  Prevent nausea by taking the medication with a snack or meal. Avoid constipation by drinking plenty of fluids and eating foods with a high amount of fiber, such as fruits, vegetables, and grains. Do not take Tylenol if you are taking prescription pain medications.  Follow Up  Our office will schedule a follow up appointment 2-3 weeks following discharge.  Please call us immediately for any of the following conditions  Increased pain, redness, drainage (pus) from your incision site. Fever of 101 degrees or higher. If you should develop stroke (slurred speech, difficulty swallowing, weakness on one side of your body, loss of vision) you should call 911 and go to the nearest emergency room.  Reduce your risk of vascular disease:  Stop smoking. If you would like help call [facility] at 1-800-QUIT-NOW (901) 282-1078) or [place] at [phone]. Manage your cholesterol Maintain a desired weight Control your diabetes Keep your blood pressure down  If you have any questions, please call the office at  [phone].   Disposition: home  Patient's condition: is Good  Follow up: 1. Dr. Donnetta Hutching in 3 weeks.   Dagoberto Ligas, PA-C [facility] [phone]   --- For [facility] use ---   Modified Rankin score at D/C (0-6): 0  IV medication needed  for:  1. Hypertension: No 2. Hypotension: No  Post-op Complications: Yes  1. Post-op CVA or TIA: Yes  If yes: Event classification (right eye, left eye, right cortical, left cortical, verterobasilar, other): left cortical  If yes: Timing of event (intra-op, <6 hrs post-op, >=6 hrs post-op, unknown): intra-op  2. CN injury: No  If yes: CN injuried   3. Myocardial infarction: No  If yes: Dx by (EKG or clinical, Troponin):   4.  CHF: No  5.  Dysrhythmia (new): No  6. Wound infection: No  7. Reperfusion symptoms: No  8. Return to OR: No  If yes: return to OR for (bleeding, neurologic, other CEA incision, other):   Discharge medications: Statin use:  No, allergy ASA use:  Yes   Beta blocker use:  No ACE-Inhibitor use:  Yes  ARB use:  No CCB use: No P2Y12 Antagonist use: Yes, [ x] Plavix, '[ ]'$  Plasugrel, '[ ]'$  Ticlopinine, '[ ]'$  Ticagrelor, '[ ]'$  Other, '[ ]'$  No for medical reason, '[ ]'$  Non-compliant, '[ ]'$  Not-indicated Anti-coagulant use:  No, '[ ]'$  Warfarin, '[ ]'$  Rivaroxaban, '[ ]'$  Dabigatran,

## 2021-11-21 NOTE — Progress Notes (Signed)
Pt with some difficulty controlling BP Will restart all home meds. PRN IV push for SBP <180 Right arm intention tremor worse than normal. Will have occupational therapy see.  MRI results pending. Will keep today, likely discharge tomorrow.    Broadus John MD

## 2021-11-21 NOTE — Progress Notes (Addendum)
  Progress Note    11/21/2021 7:27 AM 1 Day Post-Op  Subjective:  no neuro events overnight.  Subjectively, R arm deficit has resolved and now back to baseline.  Denies slurring speech or vision changes.   Vitals:   11/21/21 0500 11/21/21 0727  BP: (!) 165/58   Pulse: 72   Resp: 15   Temp:  98 F (36.7 C)  SpO2: 97%    Physical Exam: Lungs:  non labored Incisions:  L neck incision c/d/i Extremities:  moving all extremities well Neurologic: CN grossly intact; grip strength now symmetrical  CBC    Component Value Date/Time   WBC 10.1 11/21/2021 0410   RBC 3.44 (L) 11/21/2021 0410   HGB 10.8 (L) 11/21/2021 0410   HGB 13.2 08/11/2021 1352   HCT 31.0 (L) 11/21/2021 0410   HCT 38.4 08/11/2021 1352   PLT 258 11/21/2021 0410   PLT 313 08/11/2021 1352   MCV 90.1 11/21/2021 0410   MCV 89 08/11/2021 1352   MCH 31.4 11/21/2021 0410   MCHC 34.8 11/21/2021 0410   RDW 12.3 11/21/2021 0410   RDW 13.6 08/11/2021 1352   LYMPHSABS 1.6 03/26/2010 1531   MONOABS 0.6 03/26/2010 1531   EOSABS 0.2 03/26/2010 1531   BASOSABS 0.0 03/26/2010 1531    BMET    Component Value Date/Time   NA 135 11/21/2021 0410   NA 138 10/05/2021 1012   K 4.4 11/21/2021 0410   CL 103 11/21/2021 0410   CO2 24 11/21/2021 0410   GLUCOSE 212 (H) 11/21/2021 0410   BUN 19 11/21/2021 0410   BUN 21 10/05/2021 1012   CREATININE 1.42 (H) 11/21/2021 0410   CREATININE 1.22 03/15/2014 1054   CALCIUM 8.4 (L) 11/21/2021 0410   GFRNONAA 51 (L) 11/21/2021 0410   GFRAA 48 (L) 01/01/2020 1101    INR    Component Value Date/Time   INR 1.1 11/14/2021 1255     Intake/Output Summary (Last 24 hours) at 11/21/2021 0727 Last data filed at 11/21/2021 0200 Gross per 24 hour  Intake 2319.43 ml  Output 1175 ml  Net 1144.43 ml     Assessment/Plan:  76 y.o. male is s/p L CEA with post operative R arm weakness1 Day Post-Op   No neuro events overnight; subjectively R arm deficit has completely resolved and he is  back to baseline L neck incision c/d/I OOB this morning D/c foley Home this afternoon vs transfer to step down to keep an additional night    Dagoberto Ligas, PA-C [facility] [phone] 11/21/2021 7:27 AM   VASCULAR STAFF ADDENDUM: I have independently interviewed and examined the patient. I agree with the above.  No focal deficits appreciated this morning.  Will see this afternoon. If passes PT will discuss discharge.  Will discuss MRI in the outpt setting.    Cassandria Santee, MD [facility] Phone Number: [phone] 11/21/2021 11:09 AM

## 2021-11-22 ENCOUNTER — Telehealth: Payer: Self-pay | Admitting: Physician Assistant

## 2021-11-22 LAB — GLUCOSE, CAPILLARY: Glucose-Capillary: 140 mg/dL — ABNORMAL HIGH (ref 70–99)

## 2021-11-22 MED ORDER — DILTIAZEM HCL ER COATED BEADS 360 MG PO CP24
360.0000 mg | ORAL_CAPSULE | Freq: Every day | ORAL | Status: DC
  Administered 2021-11-22: 360 mg via ORAL
  Filled 2021-11-22: qty 1

## 2021-11-22 NOTE — Progress Notes (Signed)
Occupational Therapy Treatment Patient Details Name: Kyle Dunlap MRN: [id number] DOB: October 08, 1945 Today's Date: 11/22/2021   History of present illness 76 y.o. male, who presented 11/21/23 for Left CEA with known critical asymptomatic carotid stenosis of the left ICA. Post-procedure with slurred speech and decr grip RUE with Code Stroke activated. NIHSS 4; CT head no hemorrhage or LVO. MRI (-) for acute CVA.   OT comments  Pt feels he is at his baseline. Assessed use of weighted utensil/wrist cuff weights regarding tremor as noted below. Pt educated on strategies to help manage tremors with self feeding. If tremors become more bothersome, recommend he discuss following up with a neurologist with his PCP.OT signing off.    Recommendations for follow up therapy are one component of a multi-disciplinary discharge planning process, led by the attending physician.  Recommendations may be updated based on patient status, additional functional criteria and insurance authorization.    Follow Up Recommendations  No OT follow up    Assistance Recommended at Discharge Set up Supervision/Assistance  Patient can return home with the following      Equipment Recommendations  None recommended by OT    Recommendations for Other Services      Precautions / Restrictions Precautions Precautions: None       Mobility Bed Mobility                    Transfers                         Balance                                           ADL either performed or assessed with clinical judgement   ADL Overall ADL's : At baseline                                       General ADL Comments: states tremor is better this am; fed himself without difficulty    Extremity/Trunk Assessment Upper Extremity Assessment Upper Extremity Assessment: RUE deficits/detail            Vision       Perception     Praxis      Cognition  Arousal/Alertness: Awake/alert Behavior During Therapy: Flat affect Overall Cognitive Status: Within Functional Limits for tasks assessed                                          Exercises      Shoulder Instructions       General Comments assessed use of weighted utensil and cuff weights in helping with tremors; pt did not feel either helped; educated on compensatory strategies of keeping RUE "tucked with pressure" against side to reduce tremors and increase stability - this appeared to help more than weights; pt also uses LUE to assist; discussed use of lidded cups    Pertinent Vitals/ Pain       Pain Assessment Pain Assessment: No/denies pain  Home Living  Prior Functioning/Environment              Frequency           Progress Toward Goals  OT Goals(current goals can now be found in the care plan section)  Progress towards OT goals: Goals met/education completed, patient discharged from OT  Acute Rehab OT Goals Patient Stated Goal: home today ADL Goals Additional ADL Goal #1: independent with use of strateiges to increase independence and reduce spillage with self feeding  Plan All goals met and education completed, patient discharged from OT services    Co-evaluation                 AM-PAC OT "6 Clicks" Daily Activity     Outcome Measure   Help from another person eating meals?: None Help from another person taking care of personal grooming?: None Help from another person toileting, which includes using toliet, bedpan, or urinal?: None Help from another person bathing (including washing, rinsing, drying)?: None Help from another person to put on and taking off regular upper body clothing?: None Help from another person to put on and taking off regular lower body clothing?: None 6 Click Score: 24    End of Session        Activity Tolerance Patient tolerated treatment  well   Patient Left in bed;with call bell/phone within reach   Nurse Communication Other (comment) (no OT follow up; BP 197/74)        Time: 2202-5427 OT Time Calculation (min): 10 min  Charges: OT General Charges $OT Visit: 1 Visit OT Treatments $Self Care/Home Management : 8-22 mins  Maurie Boettcher, OT/L   Acute OT Clinical Specialist Acute Rehabilitation Services Pager [phone] Office [phone]   [facility] 11/22/2021, 9:14 AM

## 2021-11-22 NOTE — Telephone Encounter (Signed)
-----   Message from Dagoberto Ligas, PA-C sent at 11/20/2021 10:33 AM EDT -----  2-3 week incision check with PA on [place] office day.  PO L CEA Thanks

## 2021-11-22 NOTE — Progress Notes (Addendum)
  Progress Note    11/22/2021 7:42 AM 2 Days Post-Op  Subjective:  feeling ready for discharge home.  R arm deficit has resolved.  Denies slurred speech or vision changes   Vitals:   11/22/21 0700 11/22/21 0731  BP: (!) 164/86   Pulse: 78   Resp: 15   Temp:  98.1 F (36.7 C)  SpO2: 93%    Physical Exam: Lungs:  non labored Incisions:  L neck c/d/i Extremities:  moving all extremities well Neurologic: CN grossly intact; symmetrical grip strength  CBC    Component Value Date/Time   WBC 10.1 11/21/2021 0410   RBC 3.44 (L) 11/21/2021 0410   HGB 10.8 (L) 11/21/2021 0410   HGB 13.2 08/11/2021 1352   HCT 31.0 (L) 11/21/2021 0410   HCT 38.4 08/11/2021 1352   PLT 258 11/21/2021 0410   PLT 313 08/11/2021 1352   MCV 90.1 11/21/2021 0410   MCV 89 08/11/2021 1352   MCH 31.4 11/21/2021 0410   MCHC 34.8 11/21/2021 0410   RDW 12.3 11/21/2021 0410   RDW 13.6 08/11/2021 1352   LYMPHSABS 1.6 03/26/2010 1531   MONOABS 0.6 03/26/2010 1531   EOSABS 0.2 03/26/2010 1531   BASOSABS 0.0 03/26/2010 1531    BMET    Component Value Date/Time   NA 135 11/21/2021 0410   NA 138 10/05/2021 1012   K 4.4 11/21/2021 0410   CL 103 11/21/2021 0410   CO2 24 11/21/2021 0410   GLUCOSE 212 (H) 11/21/2021 0410   BUN 19 11/21/2021 0410   BUN 21 10/05/2021 1012   CREATININE 1.42 (H) 11/21/2021 0410   CREATININE 1.22 03/15/2014 1054   CALCIUM 8.4 (L) 11/21/2021 0410   GFRNONAA 51 (L) 11/21/2021 0410   GFRAA 48 (L) 01/01/2020 1101    INR    Component Value Date/Time   INR 1.1 11/14/2021 1255     Intake/Output Summary (Last 24 hours) at 11/22/2021 6283 Last data filed at 11/21/2021 2000 Gross per 24 hour  Intake 628.59 ml  Output 801 ml  Net -172.41 ml     Assessment/Plan:  76 y.o. male is s/p L CEA with intra op TIA 2 Days Post-Op   Neuro exam remained at baseline; R arm deficit completely resolved; MR negative for CVA BP issues yesterday; better controlled today with home meds;  will plan follow up with PCP after discharge L neck incision is well appearing Patient did well with PT/OT yesterday; no recommendations for further therapy [facility] for discharge home   Dagoberto Ligas, [facility] [phone] 11/22/2021 7:42 AM   VASCULAR STAFF ADDENDUM: I have independently interviewed and examined the patient. I agree with the above.  No deficits. Baseline intention tremor improved from yesterday.  No stroke on MRI.  Unclear etiology for transient deficit. Continue DAPT, statin   Cassandria Santee, MD [facility] Phone Number: [phone] 11/22/2021 8:14 AM

## 2021-11-23 ENCOUNTER — Encounter: Payer: Self-pay | Admitting: Family Medicine

## 2021-12-01 ENCOUNTER — Ambulatory Visit: Payer: Medicare Other | Admitting: Family Medicine

## 2021-12-01 ENCOUNTER — Encounter: Payer: Self-pay | Admitting: Family Medicine

## 2021-12-01 VITALS — BP 128/64 | HR 80 | Temp 98.4°F | Ht 69.0 in | Wt 207.0 lb

## 2021-12-01 DIAGNOSIS — E1121 Type 2 diabetes mellitus with diabetic nephropathy: Secondary | ICD-10-CM

## 2021-12-01 DIAGNOSIS — R29898 Other symptoms and signs involving the musculoskeletal system: Secondary | ICD-10-CM | POA: Diagnosis not present

## 2021-12-01 DIAGNOSIS — D649 Anemia, unspecified: Secondary | ICD-10-CM | POA: Diagnosis not present

## 2021-12-01 DIAGNOSIS — E875 Hyperkalemia: Secondary | ICD-10-CM

## 2021-12-01 DIAGNOSIS — I10 Essential (primary) hypertension: Secondary | ICD-10-CM | POA: Diagnosis not present

## 2021-12-01 NOTE — Patient Instructions (Signed)
Lab today.  Referral in for Neurology.  Follow up in 3 months.

## 2021-12-03 LAB — CBC
Hematocrit: 38.8 % (ref 37.5–51.0)
Hemoglobin: 12.9 g/dL — ABNORMAL LOW (ref 13.0–17.7)
MCH: 29.7 pg (ref 26.6–33.0)
MCHC: 33.2 g/dL (ref 31.5–35.7)
MCV: 89 fL (ref 79–97)
Platelets: 473 10*3/uL — ABNORMAL HIGH (ref 150–450)
RBC: 4.35 x10E6/uL (ref 4.14–5.80)
RDW: 12.2 % (ref 11.6–15.4)
WBC: 11.2 10*3/uL — ABNORMAL HIGH (ref 3.4–10.8)

## 2021-12-03 LAB — HEMOGLOBIN A1C
Est. average glucose Bld gHb Est-mCnc: 177 mg/dL
Hgb A1c MFr Bld: 7.8 % — ABNORMAL HIGH (ref 4.8–5.6)

## 2021-12-03 LAB — CMP14+EGFR
ALT: 31 IU/L (ref 0–44)
AST: 26 IU/L (ref 0–40)
Albumin/Globulin Ratio: 1.5 (ref 1.2–2.2)
Albumin: 4.7 g/dL (ref 3.8–4.8)
Alkaline Phosphatase: 136 IU/L — ABNORMAL HIGH (ref 44–121)
BUN/Creatinine Ratio: 15 (ref 10–24)
BUN: 22 mg/dL (ref 8–27)
Bilirubin Total: 0.3 mg/dL (ref 0.0–1.2)
CO2: 26 mmol/L (ref 20–29)
Calcium: 10.5 mg/dL — ABNORMAL HIGH (ref 8.6–10.2)
Chloride: 93 mmol/L — ABNORMAL LOW (ref 96–106)
Creatinine, Ser: 1.47 mg/dL — ABNORMAL HIGH (ref 0.76–1.27)
Globulin, Total: 3.1 g/dL (ref 1.5–4.5)
Glucose: 149 mg/dL — ABNORMAL HIGH (ref 70–99)
Potassium: 5.9 mmol/L — ABNORMAL HIGH (ref 3.5–5.2)
Sodium: 132 mmol/L — ABNORMAL LOW (ref 134–144)
Total Protein: 7.8 g/dL (ref 6.0–8.5)
eGFR: 49 mL/min/{1.73_m2} — ABNORMAL LOW (ref >59)

## 2021-12-03 LAB — MICROALBUMIN / CREATININE URINE RATIO
Creatinine, Urine: 73.8 mg/dL
Microalb/Creat Ratio: 538 mg/g creat — ABNORMAL HIGH (ref 0–29)
Microalbumin, Urine: 397.2 ug/mL

## 2021-12-04 DIAGNOSIS — R29898 Other symptoms and signs involving the musculoskeletal system: Secondary | ICD-10-CM | POA: Insufficient documentation

## 2021-12-04 NOTE — Assessment & Plan Note (Signed)
A1c not at goal.  Given his advanced renal disease and intolerance to SGLT2, will likely need insulin therapy.  Referring to endocrinology.

## 2021-12-04 NOTE — Progress Notes (Signed)
Subjective:  Patient ID: Kyle Dunlap, male    DOB: Sep 25, 1945  Age: 76 y.o. MRN: [id number]  CC: Chief Complaint  Patient presents with   Diabetes    Blood sugars were ranging from 140 to 280 this week - stopped jardiance due to kidney function at recent hospital stay for left carotid artery, taking glipizide    HPI:  76 year old male with a complicated past medical history who is status post recent carotid endarterectomy presents for follow-up.  Patient states that he is doing fairly well after his surgery.  He did have a complication during surgery.  He had right arm deficit.  Subsequent resolved.  Work-up was unremarkable. States that his blood sugars were elevated in the hospital.  Seem to be improving now.  He is currently on glipizide only.  Was previously on Jardiance but this was recommended to be stopped about his nephrologist.  He needs A1c today to reassess.  Hypertension is at goal.  He is on enalapril 20 mg daily, hydralazine 50 mg twice daily and diltiazem 360 mg daily.  Patient states that he is noted that he has had tremor of his right hand.  Noticeable mostly when he is drinking or eating.  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/2021   Carotid artery stenosis 11/20/2021   Generalized anxiety disorder 10/26/2021   Hypothyroidism 10/26/2021   Type 2 diabetes mellitus with diabetic nephropathy, without long-term current use of insulin ([place]) 11/15/2020   Stage 3b chronic kidney disease ([place]) 11/15/2020   Chronic right-sided low back pain without sciatica 11/15/2020   Diabetic neuropathy ([place]) 11/15/2020   Statin myopathy 05/09/2020   Aortic atherosclerosis ([place]) 05/06/2020   Meniere's disease 03/20/2015   Hyperlipidemia LDL goal <70 07/15/2012   Essential hypertension, benign 07/15/2012   GERD (gastroesophageal reflux disease) 08/03/2011   H/O adenomatous polyp of colon 08/03/2011   Hearing loss 01/22/1998    Social Hx    Social History   Socioeconomic History   Marital status: Married    Spouse name: Juliann Pulse   Number of children: 2   Years of education: Not on file   Highest education level: Not on file  Occupational History   Not on file  Tobacco Use   Smoking status: Former    Packs/day: 2.00    Years: 24.00    Total pack years: 48.00    Types: Cigarettes    Quit date: 1985    Years since quitting: 38.8   Smokeless tobacco: Never  Vaping Use   Vaping Use: Never used  Substance and Sexual Activity   Alcohol use: Yes    Comment: occasionally- 4-5 mixed drinks per week.   Drug use: No   Sexual activity: Yes    Birth control/protection: None  Other Topics Concern   Not on file  Social History Narrative   Not on file   Social Determinants of Health   Financial Resource Strain: Low Risk  (10/26/2021)   Overall Financial Resource Strain (CARDIA)    Difficulty of Paying Living Expenses: Not hard at all  Food Insecurity: No Food Insecurity (11/21/2021)   Hunger Vital Sign    Worried About Running Out of Food in the Last Year: Never true    Ran Out of Food in the Last Year: Never true  Transportation Needs: No Transportation Needs (11/21/2021)   PRAPARE - Hydrologist (Medical): No    Lack of Transportation (Non-Medical): No  Physical Activity: Inactive (10/26/2021)   Exercise Vital Sign    Days of Exercise per Week: 0 days    Minutes of Exercise per Session: 0 min  Stress: No Stress Concern Present (10/26/2021)   [place]    Feeling of Stress : Not at all  Social Connections: Moderately Integrated (10/26/2021)   Social Connection and Isolation Panel [NHANES]    Frequency of Communication with Friends and Family: Twice a week    Frequency of Social Gatherings with Friends and Family: Twice a week    Attends Religious Services: More than 4 times per year    Active Member of [employer] or  Organizations: No    Attends Music therapist: Never    Marital Status: Married    Review of Systems Per HPI  Objective:  BP 128/64   Pulse 80   Temp 98.4 F (36.9 C)   Ht _0  (1.753 m)   Wt 207 lb (93.9 kg)   SpO2 98%   BMI 30.57 kg/m      12/01/2021    1:53 PM 11/22/2021   11:00 AM 11/22/2021   10:00 AM  BP/Weight  Systolic BP 102 585 277  Diastolic BP 64 78 79  Wt. (Lbs) 207    BMI 30.57 kg/m2      Physical Exam Vitals and nursing note reviewed.  Constitutional:      General: He is not in acute distress. HENT:     Head: Normocephalic and atraumatic.  Eyes:     General:        Right eye: No discharge.        Left eye: No discharge.     Conjunctiva/sclera: Conjunctivae normal.  Cardiovascular:     Rate and Rhythm: Normal rate and regular rhythm.     Heart sounds: Murmur heard.  Pulmonary:     Effort: Pulmonary effort is normal.     Breath sounds: Normal breath sounds. No wheezing or rales.  Musculoskeletal:     Comments: Cogwheel rigidity noted with passive range of motion of the arms with flexion and extension of the elbow.  Neurological:     Mental Status: He is alert.  Psychiatric:     Comments: Flat affect.     Lab Results  Component Value Date   WBC 11.2 (H) 12/01/2021   HGB 12.9 (L) 12/01/2021   HCT 38.8 12/01/2021   PLT 473 (H) 12/01/2021   GLUCOSE 149 (H) 12/01/2021   CHOL 203 (H) 08/11/2021   TRIG 245 (H) 08/11/2021   HDL 39 (L) 08/11/2021   LDLCALC 121 (H) 08/11/2021   ALT 31 12/01/2021   AST 26 12/01/2021   NA 132 (L) 12/01/2021   K 5.9 (H) 12/01/2021   CL 93 (L) 12/01/2021   CREATININE 1.47 (H) 12/01/2021   BUN 22 12/01/2021   CO2 26 12/01/2021   TSH 2.060 08/11/2021   PSA 1.92 03/15/2014   INR 1.1 11/14/2021   HGBA1C 7.8 (H) 12/01/2021   MICROALBUR 60.2 (H) 03/15/2014     Assessment & Plan:   Problem List Items Addressed This Visit       Cardiovascular and Mediastinum   Essential hypertension, benign     Blood pressure well controlled.  However, potassium is elevated.  Sodium slightly low.  We will need to decrease enalapril and recheck metabolic panel.        Endocrine   Type 2 diabetes mellitus with diabetic nephropathy, without long-term  current use of insulin (HCC) - Primary    A1c not at goal.  Given his advanced renal disease and intolerance to SGLT2, will likely need insulin therapy.  Referring to endocrinology.      Relevant Orders   CMP14+EGFR (Completed)   Hemoglobin A1c (Completed)   Microalbumin / creatinine urine ratio (Completed)     Other   Cogwheel rigidity    Cogwheel rigidity noted on exam.  Has tremor as well.  Concern for Parkinson's.  Referring to neurology.      Relevant Orders   Ambulatory referral to Neurology   Other Visit Diagnoses     Anemia, unspecified type       Relevant Orders   CBC (Completed)      Follow-up:  Return in about 3 months (around 03/03/2022).  [place]

## 2021-12-04 NOTE — Assessment & Plan Note (Signed)
Blood pressure well controlled.  However, potassium is elevated.  Sodium slightly low.  We will need to decrease enalapril and recheck metabolic panel.

## 2021-12-04 NOTE — Assessment & Plan Note (Signed)
Cogwheel rigidity noted on exam.  Has tremor as well.  Concern for Parkinson's.  Referring to neurology.

## 2021-12-05 ENCOUNTER — Encounter: Payer: Self-pay | Admitting: Neurology

## 2021-12-06 ENCOUNTER — Encounter: Payer: Self-pay | Admitting: Vascular Surgery

## 2021-12-06 ENCOUNTER — Ambulatory Visit (INDEPENDENT_AMBULATORY_CARE_PROVIDER_SITE_OTHER): Payer: Medicare Other | Admitting: Vascular Surgery

## 2021-12-06 VITALS — BP 161/76 | HR 74 | Temp 98.6°F | Ht 69.0 in | Wt 203.0 lb

## 2021-12-06 DIAGNOSIS — Z9889 Other specified postprocedural states: Secondary | ICD-10-CM

## 2021-12-06 NOTE — Progress Notes (Signed)
[facility] [place]  Patient name: Kyle Dunlap MRN: [id number] DOB: [birth year] Sex: male  REASON FOR VISIT: Follow-up recent left carotid endarterectomy with Dr. Virl Cagey on 11/20/2021  HPI: Kyle Dunlap is a 76 y.o. male here today for follow-up.  He is here with his wife.  He underwent left carotid endarterectomy for high-grade asymptomatic disease.  He had complication with finding of aphasia, dysarthria, right facial droop and right arm weakness in the PACU.  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  He had quick resolution to his baseline without deficit and was discharged to home on postop day #2.  He had had history of shaking sensation in his right arm preoperatively.  He reports that this is somewhat worse postoperatively.  He notices this most when he is eating with some shakiness of his hand as it approaches his mouth with utensil.  He denies any right leg issues or speech issues.  Current Outpatient Medications  Medication Sig Dispense Refill   acetaminophen (TYLENOL) 325 MG tablet Take 650 mg by mouth every 6 (six) hours as needed for moderate pain.     albuterol (VENTOLIN HFA) 108 (90 Base) MCG/ACT inhaler TAKE 2 PUFFS BY MOUTH EVERY 6 HOURS AS NEEDED FOR WHEEZE OR SHORTNESS OF BREATH 18 g 6   Ascorbic Acid (VITAMIN C) 1000 MG tablet Take 1,000 mg by mouth 3 (three) times a week.     aspirin 81 MG tablet Take 81 mg by mouth daily.     b complex vitamins capsule Take 1 capsule by mouth once a week.     Cholecalciferol (VITAMIN D3) 125 MCG (5000 UT) CAPS Take 5,000 Units by mouth 2 (two) times a week.     clopidogrel (PLAVIX) 75 MG tablet Take 1 tablet (75 mg total) by mouth daily. 30 tablet 2   diazepam (VALIUM) 2 MG tablet TAKE ONE TABLET ('2MG'$  TOTAL) BY MOUTH DAILY AS NEEDED FOR DIZZINESS SECONDARY TO MENIERE'S DISEASE (Patient taking differently:  Take 2 mg by mouth daily. MENIERE'S DISEASE) 90 tablet 1   diclofenac Sodium (VOLTAREN) 1 % GEL Apply 2 g topically daily as needed (pain).     DULoxetine (CYMBALTA) 60 MG capsule Take 1 capsule (60 mg total) by mouth daily. 90 capsule 1   enalapril (VASOTEC) 20 MG tablet TAKE 1 TABLET BY MOUTH EVERY DAY 90 tablet 1   glipiZIDE (GLUCOTROL) 5 MG tablet TAKE 2 TABLETS BY MOUTH TWICE A DAY 360 tablet 1   hydrALAZINE (APRESOLINE) 25 MG tablet Take 50 mg by mouth 2 (two) times daily.     levothyroxine (SYNTHROID) 25 MCG tablet TAKE ONE TABLET BY MOUTH DAILY FOR HYPOTHYROIDISM     Multiple Vitamin (MULTIVITAMIN WITH MINERALS) TABS Take 1 tablet by mouth daily.     pantoprazole (PROTONIX) 40 MG tablet TAKE 1 TABLET BY MOUTH EVERY DAY 90 tablet 1   PRALUENT 150 MG/ML SOAJ INJECT 1 PEN INTO THE SKIN EVERY 14 (FOURTEEN) DAYS. 6 mL 3   sodium chloride (OCEAN) 0.65 % SOLN nasal spray Place 1 spray into both nostrils at bedtime.     TIADYLT ER 360 MG 24 hr capsule TAKE 1 CAPSULE BY MOUTH EVERY DAY 90 capsule 1   traMADol (ULTRAM) 50 MG tablet Take 1 tablet (50 mg total) by mouth every 6 (six) hours as needed. 15 tablet 0   No current facility-administered medications for this visit.  PHYSICAL EXAM: Vitals:   12/06/21 1316 12/06/21 1319  BP: (!) 153/72 (!) 161/76  Pulse: 74   Temp: 98.6 F (37 C)   SpO2: 97%   Weight: 203 lb (92.1 kg)   Height: '5\' 9"'$  (1.753 m)     GENERAL: The patient is a well-nourished male, in no acute distress. The vital signs are documented above. Left neck incision is well-healed with no bruits bilaterally  MEDICAL ISSUES: Stable status post.  TIA immediately following surgery.  Appears to be back to his baseline with perceived some worsening in the shaky sensation in his right hand that he had preop.  We will continue with observation alone since there is no focal deficit.  He does have follow-up with neurology in 2 months.  We will see him again in 9 months with repeat  carotid duplex   Rosetta Posner, MD FACS [facility] [phone]  Note: Portions of this report may have been transcribed using voice recognition software.  Every effort has been made to ensure accuracy; however, inadvertent computerized transcription errors may still be present.

## 2021-12-21 NOTE — Addendum Note (Signed)
Addended by: Dairl Ponder on: 12/21/2021 10:17 AM   Modules accepted: Orders

## 2022-01-08 NOTE — Patient Instructions (Signed)

## 2022-01-09 ENCOUNTER — Other Ambulatory Visit: Payer: Self-pay | Admitting: Cardiovascular Disease

## 2022-01-09 DIAGNOSIS — E785 Hyperlipidemia, unspecified: Secondary | ICD-10-CM

## 2022-01-09 DIAGNOSIS — I6522 Occlusion and stenosis of left carotid artery: Secondary | ICD-10-CM

## 2022-01-09 DIAGNOSIS — I700 Atherosclerosis of aorta: Secondary | ICD-10-CM

## 2022-01-10 ENCOUNTER — Encounter: Payer: Self-pay | Admitting: Nurse Practitioner

## 2022-01-10 ENCOUNTER — Ambulatory Visit: Payer: Medicare Other | Admitting: Nurse Practitioner

## 2022-01-10 VITALS — BP 151/62 | Ht 69.0 in | Wt 209.0 lb

## 2022-01-10 DIAGNOSIS — E782 Mixed hyperlipidemia: Secondary | ICD-10-CM

## 2022-01-10 DIAGNOSIS — E039 Hypothyroidism, unspecified: Secondary | ICD-10-CM

## 2022-01-10 DIAGNOSIS — I10 Essential (primary) hypertension: Secondary | ICD-10-CM

## 2022-01-10 DIAGNOSIS — E1122 Type 2 diabetes mellitus with diabetic chronic kidney disease: Secondary | ICD-10-CM | POA: Diagnosis not present

## 2022-01-10 DIAGNOSIS — E119 Type 2 diabetes mellitus without complications: Secondary | ICD-10-CM

## 2022-01-10 DIAGNOSIS — N1831 Chronic kidney disease, stage 3a: Secondary | ICD-10-CM

## 2022-01-10 MED ORDER — GLIPIZIDE 5 MG PO TABS: 5.0000 mg | ORAL_TABLET | Freq: Two times a day (BID) | ORAL | 1 refills | Status: DC

## 2022-01-10 NOTE — Progress Notes (Signed)
Endocrinology Consult Note       01/10/2022, 12:01 PM   Subjective:    Patient ID: Kyle Dunlap, male    DOB: Jan 11, 1946.  Kyle Dunlap is being seen in consultation for management of currently uncontrolled symptomatic diabetes requested by  Coral Spikes, DO.   Past Medical History:  Diagnosis Date   Adenomatous colon polyp 11/22/2008   Dr. Gala Romney   Allergy    Carotid artery stenosis    Chronic back pain    CKD (chronic kidney disease)    DDD (degenerative disc disease), lumbar    GERD (gastroesophageal reflux disease)    Heart murmur 2023   Hemorrhoids, external    High cholesterol    History of kidney stones 2013   HTN (hypertension)    Hyperplastic colon polyp 11/22/2008   Dr. Gala Romney   Hypothyroidism    Kidney stone    "14 years ago" per pt   Meniere's disease    Meniere's disease    Pneumonia 1965   PTSD (post-traumatic stress disorder)    Tachyarrhythmia    Type 2 diabetes mellitus ([place])     Past Surgical History:  Procedure Laterality Date   CATARACT EXTRACTION W/PHACO Left 09/28/2016   Procedure: CATARACT EXTRACTION PHACO AND INTRAOCULAR LENS PLACEMENT ([place]);  Surgeon: Baruch Goldmann, MD;  Location: [facility];  Service: Ophthalmology;  Laterality: Left;  CDE: 4.40   CATARACT EXTRACTION W/PHACO Right 10/26/2016   Procedure: CATARACT EXTRACTION PHACO AND INTRAOCULAR LENS PLACEMENT RIGHT EYE;  Surgeon: Baruch Goldmann, MD;  Location: [facility];  Service: Ophthalmology;  Laterality: Right;  CDE: 6.74   CHOLECYSTECTOMY     COLONOSCOPY  12/10/2008   Dr. Gala Romney- L side diverticula, adenomatous polyp, hyperplastic polyp   COLONOSCOPY  11/16/2003   Dr. Laural Golden- performed exam to cecum,3 polyps- no path report available, external hemorrhoids,    COLONOSCOPY N/A 01/04/2014   Procedure: COLONOSCOPY;  Surgeon: Daneil Dolin, MD;  Location: [facility];  Service: Endoscopy;  Laterality: N/A;   8:30am   COLONOSCOPY N/A 02/20/2017   Surgeon: Daneil Dolin, MD; multiple colon polyps ranging 3 to 9 mm in size.  Pathology with tubular adenomas.  Recommended repeat in 3 years.   COLONOSCOPY WITH PROPOFOL N/A 10/03/2020   Procedure: COLONOSCOPY WITH PROPOFOL;  Surgeon: Daneil Dolin, MD;  Location: [facility];  Service: Endoscopy;  Laterality: N/A;  8:30am   ENDARTERECTOMY Left 11/20/2021   Procedure: LEFT CAROTID ENDARTERECTOMY;  Surgeon: Broadus John, MD;  Location: [facility] OR;  Service: Vascular;  Laterality: Left;   ESOPHAGOGASTRODUODENOSCOPY  08/31/2011   CBJ:SEGBTD esophageal erosions consistent with mild erosive reflux esophagitis   MOUTH SURGERY     POLYPECTOMY  10/03/2020   Procedure: POLYPECTOMY;  Surgeon: Daneil Dolin, MD;  Location: [facility];  Service: Endoscopy;;    Social History   Socioeconomic History   Marital status: Married    Spouse name: Juliann Pulse   Number of children: 2   Years of education: Not on file   Highest education level: Not on file  Occupational History   Not on file  Tobacco Use   Smoking status: Former    Packs/day:  2.00    Years: 24.00    Total pack years: 48.00    Types: Cigarettes    Quit date: 54    Years since quitting: 38.9   Smokeless tobacco: Never  Vaping Use   Vaping Use: Never used  Substance and Sexual Activity   Alcohol use: Yes    Comment: occasionally- 4-5 mixed drinks per week.   Drug use: No   Sexual activity: Yes    Birth control/protection: None  Other Topics Concern   Not on file  Social History Narrative   Not on file   Social Determinants of Health   Financial Resource Strain: Low Risk  (10/26/2021)   Overall Financial Resource Strain (CARDIA)    Difficulty of Paying Living Expenses: Not hard at all  Food Insecurity: No Food Insecurity (11/21/2021)   Hunger Vital Sign    Worried About Running Out of Food in the Last Year: Never true    Ran Out of Food in the Last Year: Never true   Transportation Needs: No Transportation Needs (11/21/2021)   PRAPARE - Hydrologist (Medical): No    Lack of Transportation (Non-Medical): No  Physical Activity: Inactive (10/26/2021)   Exercise Vital Sign    Days of Exercise per Week: 0 days    Minutes of Exercise per Session: 0 min  Stress: No Stress Concern Present (10/26/2021)   [place]    Feeling of Stress : Not at all  Social Connections: Moderately Integrated (10/26/2021)   Social Connection and Isolation Panel [NHANES]    Frequency of Communication with Friends and Family: Twice a week    Frequency of Social Gatherings with Friends and Family: Twice a week    Attends Religious Services: More than 4 times per year    Active Member of [employer] or Organizations: No    Attends Archivist Meetings: Never    Marital Status: Married    Family History  Problem Relation Age of Onset   Heart attack Father    Colon cancer Brother 72    Outpatient Encounter Medications as of 01/10/2022  Medication Sig   acetaminophen (TYLENOL) 325 MG tablet Take 650 mg by mouth every 6 (six) hours as needed for moderate pain.   albuterol (VENTOLIN HFA) 108 (90 Base) MCG/ACT inhaler TAKE 2 PUFFS BY MOUTH EVERY 6 HOURS AS NEEDED FOR WHEEZE OR SHORTNESS OF BREATH   Alirocumab (PRALUENT) 150 MG/ML SOAJ INJECT 1 PEN INTO THE SKIN EVERY 14 (FOURTEEN) DAYS.   Ascorbic Acid (VITAMIN C) 1000 MG tablet Take 1,000 mg by mouth 3 (three) times a week.   aspirin 81 MG tablet Take 81 mg by mouth daily.   b complex vitamins capsule Take 1 capsule by mouth once a week.   clopidogrel (PLAVIX) 75 MG tablet Take 1 tablet (75 mg total) by mouth daily.   diazepam (VALIUM) 2 MG tablet TAKE ONE TABLET ('2MG'$  TOTAL) BY MOUTH DAILY AS NEEDED FOR DIZZINESS SECONDARY TO MENIERE'S DISEASE (Patient taking differently: Take 2 mg by mouth daily. MENIERE'S DISEASE)   diclofenac  Sodium (VOLTAREN) 1 % GEL Apply 2 g topically daily as needed (pain).   DULoxetine (CYMBALTA) 60 MG capsule Take 1 capsule (60 mg total) by mouth daily.   enalapril (VASOTEC) 20 MG tablet TAKE 1 TABLET BY MOUTH EVERY DAY   hydrALAZINE (APRESOLINE) 25 MG tablet Take 50 mg by mouth 2 (two) times daily.   levothyroxine (SYNTHROID) 25  MCG tablet TAKE ONE TABLET BY MOUTH DAILY FOR HYPOTHYROIDISM   Multiple Vitamin (MULTIVITAMIN WITH MINERALS) TABS Take 1 tablet by mouth daily.   pantoprazole (PROTONIX) 40 MG tablet TAKE 1 TABLET BY MOUTH EVERY DAY   sodium chloride (OCEAN) 0.65 % SOLN nasal spray Place 1 spray into both nostrils at bedtime.   TIADYLT ER 360 MG 24 hr capsule TAKE 1 CAPSULE BY MOUTH EVERY DAY   traMADol (ULTRAM) 50 MG tablet Take 1 tablet (50 mg total) by mouth every 6 (six) hours as needed.   [DISCONTINUED] glipiZIDE (GLUCOTROL) 5 MG tablet TAKE 2 TABLETS BY MOUTH TWICE A DAY   Cholecalciferol (VITAMIN D3) 125 MCG (5000 UT) CAPS Take 5,000 Units by mouth 2 (two) times a week.   glipiZIDE (GLUCOTROL) 5 MG tablet Take 1 tablet (5 mg total) by mouth 2 (two) times daily before a meal.   [DISCONTINUED] PRALUENT 150 MG/ML SOAJ INJECT 1 PEN INTO THE SKIN EVERY 14 (FOURTEEN) DAYS.   No facility-administered encounter medications on file as of 01/10/2022.    ALLERGIES: Allergies  Allergen Reactions   Atorvastatin Other (See Comments)    Joint and muscle aches, weakness present   Zetia [Ezetimibe] Other (See Comments)    Elevated liver enzymes   Zocor [Simvastatin] Other (See Comments)    Joint and muscle aches   Codeine Other (See Comments)    Passed out    VACCINATION STATUS: Immunization History  Administered Date(s) Administered   Fluad Quad(high Dose 65+) 11/15/2020, 10/24/2021   H1N1 01/01/2008   Influenza,inj,Quad PF,6+ Mos 10/22/2014, 10/25/2015, 11/07/2016, 10/23/2017, 10/24/2018, 10/05/2019   Influenza-Unspecified 10/23/2010, 10/28/2012, 10/22/2013, 10/28/2019    Moderna Sars-Covid-2 Vaccination 03/09/2019, 04/06/2019, 11/17/2019   PFIZER(Purple Top)SARS-COV-2 Vaccination 02/17/2019, 03/10/2019, 10/22/2019   Pneumococcal Conjugate-13 03/15/2014   Pneumococcal Polysaccharide-23 09/22/2009   Td 07/17/2007   Tdap 09/29/2021   Zoster Recombinat (Shingrix) 03/10/2020   Zoster, Live 03/05/2013    Diabetes He presents for his initial diabetic visit. He has type 2 diabetes mellitus. Onset time: Diagnosed at approx age of 76. Hypoglycemia symptoms include nervousness/anxiousness, sweats and tremors. Associated symptoms include fatigue and foot paresthesias. There are no hypoglycemic complications. Symptoms are stable. Diabetic complications include nephropathy and peripheral neuropathy. Risk factors for coronary artery disease include diabetes mellitus, dyslipidemia, family history, male sex, hypertension and sedentary lifestyle. Current diabetic treatment includes oral agent (monotherapy). He is compliant with treatment most of the time. His weight is fluctuating minimally. He is following a generally unhealthy diet. When asked about meal planning, he reported none. He has not had a previous visit with a dietitian. He rarely participates in exercise. (He presents today for his consultation, accompanied by his wife, with no meter or logs to review.  His  most recent A1c was 7.8% on 12/01/21.  He monitors glucose 1-2 times per week.  He drinks water, coffee with cream and splenda, 1 soda per day, and eats only 1 meal per day, snacking between.  He does not engage in routine physical activity.  He is UTD on eye exam, does see podiatry.) An ACE inhibitor/angiotensin II receptor blocker is being taken. He sees a podiatrist.Eye exam is current.     Review of systems  Constitutional: + Minimally fluctuating body weight, current Body mass index is 30.86 kg/m., no fatigue, no subjective hyperthermia, no subjective hypothermia, HOH Eyes: no blurry vision, no  xerophthalmia ENT: no sore throat, no nodules palpated in throat, no dysphagia/odynophagia, no hoarseness Cardiovascular: no chest pain, no shortness of breath,  no palpitations, no leg swelling Respiratory: no cough, no shortness of breath Gastrointestinal: no nausea/vomiting/diarrhea Musculoskeletal: no muscle/joint aches Skin: no rashes, no hyperemia Neurological: no tremors, + numbness/tingling to BLE, no dizziness Psychiatric: no depression, no anxiety  Objective:     BP (!) 151/62 (BP Location: Right Arm, Patient Position: Sitting, Cuff Size: Large) Comment: Patient and his wife both state that he has white coat syndrome  Ht '5\' 9"'$  (1.753 m)   Wt 209 lb (94.8 kg)   BMI 30.86 kg/m   Wt Readings from Last 3 Encounters:  01/10/22 209 lb (94.8 kg)  12/06/21 203 lb (92.1 kg)  12/01/21 207 lb (93.9 kg)     BP Readings from Last 3 Encounters:  01/10/22 (!) 151/62  12/06/21 (!) 161/76  12/01/21 128/64     Physical Exam- Limited  Constitutional:  Body mass index is 30.86 kg/m. , not in acute distress, normal state of mind Eyes:  EOMI, no exophthalmos Neck: Supple Cardiovascular: RRR, + murmur, rubs, or gallops, no edema Respiratory: Adequate breathing efforts, no crackles, rales, rhonchi, or wheezing Musculoskeletal: no gross deformities, strength intact in all four extremities, no gross restriction of joint movements Skin:  no rashes, no hyperemia Neurological: no tremor with outstretched hands    CMP ( most recent) CMP     Component Value Date/Time   NA 132 (L) 12/01/2021 1443   K 5.9 (H) 12/01/2021 1443   CL 93 (L) 12/01/2021 1443   CO2 26 12/01/2021 1443   GLUCOSE 149 (H) 12/01/2021 1443   GLUCOSE 212 (H) 11/21/2021 0410   BUN 22 12/01/2021 1443   CREATININE 1.47 (H) 12/01/2021 1443   CREATININE 1.22 03/15/2014 1054   CALCIUM 10.5 (H) 12/01/2021 1443   PROT 7.8 12/01/2021 1443   ALBUMIN 4.7 12/01/2021 1443   AST 26 12/01/2021 1443   ALT 31 12/01/2021 1443    ALKPHOS 136 (H) 12/01/2021 1443   BILITOT 0.3 12/01/2021 1443   GFRNONAA 51 (L) 11/21/2021 0410   GFRAA 48 (L) 01/01/2020 1101     Diabetic Labs (most recent): Lab Results  Component Value Date   HGBA1C 7.8 (H) 12/01/2021   HGBA1C 7.7 (H) 08/11/2021   HGBA1C 8.1 (H) 11/15/2020   MICROALBUR 60.2 (H) 03/15/2014     Lipid Panel ( most recent) Lipid Panel     Component Value Date/Time   CHOL 203 (H) 08/11/2021 1352   TRIG 245 (H) 08/11/2021 1352   HDL 39 (L) 08/11/2021 1352   CHOLHDL 5.2 (H) 08/11/2021 1352   CHOLHDL 7.3 03/15/2014 1054   VLDL 45 (H) 03/15/2014 1054   LDLCALC 121 (H) 08/11/2021 1352   LABVLDL 43 (H) 08/11/2021 1352      Lab Results  Component Value Date   TSH 2.060 08/11/2021           Assessment & Plan:   1) Type 2 diabetes mellitus with stage 3a chronic kidney disease, without long-term current use of insulin ([facility]CC)  He presents today for his consultation, accompanied by his wife, with no meter or logs to review.  His  most recent A1c was 7.8% on 12/01/21.  He monitors glucose 1-2 times per week.  He drinks water, coffee with cream and splenda, 1 soda per day, and eats only 1 meal per day, snacking between.  He does not engage in routine physical activity.  He is UTD on eye exam, does see podiatry.  - Kyle Dunlap has currently uncontrolled symptomatic type 2 DM since 76 years  of age, with most recent A1c of 7.8 %.   -Recent labs reviewed.  - I had a long discussion with him about the progressive nature of diabetes and the pathology behind its complications. -his diabetes is complicated by CKD stage 3 and neuropathy and he remains at a high risk for more acute and chronic complications which include CAD, CVA, CKD, retinopathy, and neuropathy. These are all discussed in detail with him.  The following Lifestyle Medicine recommendations according to [facility]) were discussed and offered to patient and he  agrees to start the journey:  A. Whole Foods, Plant-based plate comprising of fruits and vegetables, plant-based proteins, whole-grain carbohydrates was discussed in detail with the patient.   A list for source of those nutrients were also provided to the patient.  Patient will use only water or unsweetened tea for hydration. B.  The need to stay away from risky substances including alcohol, smoking; obtaining 7 to 9 hours of restorative sleep, at least 150 minutes of moderate intensity exercise weekly, the importance of healthy social connections,  and stress reduction techniques were discussed. C.  A full color page of  Calorie density of various food groups per pound showing examples of each food groups was provided to the patient.  - I have counseled him on diet and weight management by adopting a carbohydrate restricted/protein rich diet. Patient is encouraged to switch to unprocessed or minimally processed complex starch and increased protein intake (animal or plant source), fruits, and vegetables. -  he is advised to stick to a routine mealtimes to eat 3 meals a day and avoid unnecessary snacks (to snack only to correct hypoglycemia).   - he acknowledges that there is a room for improvement in his food and drink choices. - Suggestion is made for him to avoid simple carbohydrates from his diet including Cakes, Sweet Desserts, Ice Cream, Soda (diet and regular), Sweet Tea, Candies, Chips, Cookies, Store Bought Juices, Alcohol in Excess of 1-2 drinks a day, Artificial Sweeteners, Coffee Creamer, and "Sugar-free" Products. This will help patient to have more stable blood glucose profile and potentially avoid unintended weight gain.  - I have approached him with the following individualized plan to manage his diabetes and patient agrees:   - Given his advanced age, avoiding hypoglycemia is a top priority for him.  As such, an appropriate A1c target for him would be around 7-7.5%.   -He is advised to  lower his Glipizide to 5 mg po twice daily with meals, a safer dose for him. -he is encouraged to start monitoring glucose twice daily, before breakfast and before bed, to log their readings on the clinic sheets provided, and bring them to review at follow up appointment in 4 weeks.  - Adjustment parameters are given to him for hypo and hyperglycemia in writing. - he is encouraged to call clinic for blood glucose levels less than 70 or above 300 mg /dl.  - he is not a candidate for Metformin due to concurrent renal insufficiency.  He was previously on Jardiance but kidney function actually got worse on this medication, thus it was discontinued.  - he will be considered for incretin therapy as appropriate next visit.  - Specific targets for  A1c; LDL, HDL, and Triglycerides were discussed with the patient.  2) Blood Pressure /Hypertension:  his blood pressure is controlled to target.   he is advised to continue his current medications including Enalapril 20 mg p.o. daily with breakfast and  Hydralazine 50 mg po twice daily.  3) Lipids/Hyperlipidemia:    Review of his recent lipid panel from 08/11/21 showed uncontrolled LDL at 121 and elevated triglycerides of 245.  He is on Praluent 150 mg injections every 2 weeks, has had adverse reactions to both statin and Zetia in the past.   4)  Weight/Diet:  his Body mass index is 30.86 kg/m.  -  clearly complicating his diabetes care.   he is a candidate for weight loss. I discussed with him the fact that loss of 5 - 10% of his  current body weight will have the most impact on his diabetes management.  Exercise, and detailed carbohydrates information provided  -  detailed on discharge instructions.  5) Chronic Care/Health Maintenance: -he on ACEI/ARB and Praluent medications and is encouraged to initiate and continue to follow up with Ophthalmology, Dentist, Podiatrist at least yearly or according to recommendations, and advised to stay away from smoking.  I have recommended yearly flu vaccine and pneumonia vaccine at least every 5 years; moderate intensity exercise for up to 150 minutes weekly; and sleep for at least 7 hours a day.  - he is advised to maintain close follow up with Coral Spikes, DO for primary care needs, as well as his other providers for optimal and coordinated care.   - Time spent in this patient care: 60 min, of which > 50% was spent in counseling him about his diabetes and the rest reviewing his blood glucose logs, discussing his hypoglycemia and hyperglycemia episodes, reviewing his current and previous labs/studies (including abstraction from other facilities) and medications doses and developing a long term treatment plan based on the latest standards of care/guidelines; and documenting his care.    Please refer to Patient Instructions for Blood Glucose Monitoring and Insulin/Medications Dosing Guide" in media tab for additional information. Please also refer to "Patient Self Inventory" in the Media tab for reviewed elements of pertinent patient history.  Ruben Reason participated in the discussions, expressed understanding, and voiced agreement with the above plans.  All questions were answered to his satisfaction. he is encouraged to contact clinic should he have any questions or concerns prior to his return visit.     Follow up plan: - Return in about 4 weeks (around 02/07/2022) for Diabetes F/U, No previsit labs, Bring meter and logs.    Rayetta Pigg, [facility], [facility] [address] [place], [place] [address] Phone: [phone] Fax: [phone]  01/10/2022, 12:01 PM

## 2022-01-17 ENCOUNTER — Ambulatory Visit: Payer: Medicare Other | Attending: Cardiovascular Disease

## 2022-01-17 DIAGNOSIS — I6523 Occlusion and stenosis of bilateral carotid arteries: Secondary | ICD-10-CM

## 2022-01-19 ENCOUNTER — Telehealth: Payer: Self-pay

## 2022-01-19 NOTE — Telephone Encounter (Signed)
Patient wife notified and verbalized understanding. Patients wife had no questions or concerns at this time

## 2022-01-19 NOTE — Telephone Encounter (Signed)
-----   Message from Michaelyn Barter, RN sent at 01/19/2022 10:38 AM EST -----  ----- Message ----- From: Josue Hector, MD Sent: 01/19/2022   9:20 AM EST To: Michaelyn Barter, RN  Plaque no stenosis f/u carotid duplex in 2 years

## 2022-01-23 NOTE — Progress Notes (Unsigned)
Assessment/Plan:    ***2.  Hypoperfusion syndrome  -Patient had carotid endarterectomy October 30 and woke up with right arm paresis.  Symptoms lasted until the following day.  CT brain, CTA brain, MRI brain all negative.  Symptoms likely result of hypoperfusion during procedure with slow recovery following.  Patient currently at baseline.  Subjective:   Kyle Dunlap was seen today in the movement disorders clinic for neurologic consultation at the request of Coral Spikes, DO.  The consultation is for the evaluation of right arm tremor and to rule out Parkinson's disease.  Medical records made available to me are reviewed.  Patient was in the hospital at the end of October/beginning of November.  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  Unfortunately, when the patient woke up from anesthesia, he had right arm paresis.  CT head was performed and was negative.  CT angio demonstrated widely patent carotid endarterectomy site.  MRI brain was negative for acute infarct.  Patient saw Dr. Erlinda Hong as well, and it was felt that symptoms were likely due to hypoperfusion during the procedure with slow recovery following, associated with his carotid endarterectomy.  Patient was started on dual antiplatelet therapy.  Patient was back to baseline postop day 1.  He was discharged from the hospital.  He followed up with primary care 9 days later and mentioned tremors of the right hand.  His primary care felt that he was a bit parkinsonian and patient was subsequently referred here for further evaluation.   Specific Symptoms:  Tremor: {yes no:314532} Family hx of similar:  {yes no:314532} Voice: *** Sleep: ***  Vivid Dreams:  {yes no:314532}  Acting out dreams:  {yes no:314532} Wet Pillows: {yes no:314532} Postural symptoms:  {yes no:314532}  Falls?  {yes no:314532} Bradykinesia symptoms: {parkinson brady:18041} Loss of smell:  {yes no:314532} Loss of  taste:  {yes no:314532} Urinary Incontinence:  {yes no:314532} Difficulty Swallowing:  {yes no:314532} Handwriting, micrographia: {yes no:314532} Trouble with ADL's:  {yes no:314532}  Trouble buttoning clothing: {yes no:314532} Depression:  {yes no:314532} Memory changes:  {yes no:314532} Hallucinations:  {yes no:314532}  visual distortions: {yes no:314532} N/V:  {yes no:314532} Lightheaded:  {yes no:314532}  Syncope: {yes no:314532} Diplopia:  {yes no:314532} Dyskinesia:  {yes no:314532} Prior exposure to reglan/antipsychotics: {yes no:314532}  Neuroimaging of the brain has *** previously been performed.  It *** available for my review today.  PREVIOUS MEDICATIONS: {Parkinson's RX:18200}  ALLERGIES:   Allergies  Allergen Reactions   Atorvastatin Other (See Comments)    Joint and muscle aches, weakness present   Zetia [Ezetimibe] Other (See Comments)    Elevated liver enzymes   Zocor [Simvastatin] Other (See Comments)    Joint and muscle aches   Codeine Other (See Comments)    Passed out    CURRENT MEDICATIONS:  No outpatient medications have been marked as taking for the 01/25/22 encounter (Appointment) with Vennela Jutte, Eustace Quail, DO.     Objective:   VITALS:  There were no vitals filed for this visit.  GEN:  The patient appears stated age and is in NAD. HEENT:  Normocephalic, atraumatic.  The mucous membranes are moist. The superficial temporal arteries are without ropiness or tenderness. CV:  RRR Lungs:  CTAB Neck/HEME:  There are no carotid bruits bilaterally.  Neurological examination:  Orientation: The patient is alert and oriented x3.  Cranial nerves: There is good facial symmetry. Extraocular muscles are intact. The visual fields are full to confrontational  testing. The speech is fluent and clear. Soft palate rises symmetrically and there is no tongue deviation. Hearing is intact to conversational tone. Sensation: Sensation is intact to light and pinprick throughout  (facial, trunk, extremities). Vibration is intact at the bilateral big toe. There is no extinction with double simultaneous stimulation. There is no sensory dermatomal level identified. Motor: Strength is 5/5 in the bilateral upper and lower extremities.   Shoulder shrug is equal and symmetric.  There is no pronator drift. Deep tendon reflexes: Deep tendon reflexes are 2/4 at the bilateral biceps, triceps, brachioradialis, patella and achilles. Plantar responses are downgoing bilaterally.  Movement examination: Tone: There is ***tone in the bilateral upper extremities.  The tone in the lower extremities is ***.  Abnormal movements: *** Coordination:  There is *** decremation with RAM's, *** Gait and Station: The patient has *** difficulty arising out of a deep-seated chair without the use of the hands. The patient's stride length is ***.  The patient has a *** pull test.     I have reviewed and interpreted the following labs independently   Chemistry      Component Value Date/Time   NA 132 (L) 12/01/2021 1443   K 5.9 (H) 12/01/2021 1443   CL 93 (L) 12/01/2021 1443   CO2 26 12/01/2021 1443   BUN 22 12/01/2021 1443   CREATININE 1.47 (H) 12/01/2021 1443   CREATININE 1.22 03/15/2014 1054      Component Value Date/Time   CALCIUM 10.5 (H) 12/01/2021 1443   ALKPHOS 136 (H) 12/01/2021 1443   AST 26 12/01/2021 1443   ALT 31 12/01/2021 1443   BILITOT 0.3 12/01/2021 1443      Lab Results  Component Value Date   TSH 2.060 08/11/2021   Lab Results  Component Value Date   WBC 11.2 (H) 12/01/2021   HGB 12.9 (L) 12/01/2021   HCT 38.8 12/01/2021   MCV 89 12/01/2021   PLT 473 (H) 12/01/2021     Total time spent on today's visit was ***60 minutes, including both face-to-face time and nonface-to-face time.  Time included that spent on review of records (prior notes available to me/labs/imaging if pertinent), discussing treatment and goals, answering patient's questions and coordinating  care.  Cc:  Coral Spikes, DO

## 2022-01-25 ENCOUNTER — Ambulatory Visit: Payer: Medicare Other | Admitting: Neurology

## 2022-01-25 ENCOUNTER — Encounter: Payer: Self-pay | Admitting: Neurology

## 2022-01-25 ENCOUNTER — Other Ambulatory Visit: Payer: Self-pay

## 2022-01-25 VITALS — BP 194/72 | HR 70 | Ht 69.0 in | Wt 208.6 lb

## 2022-01-25 DIAGNOSIS — I679 Cerebrovascular disease, unspecified: Secondary | ICD-10-CM

## 2022-01-25 DIAGNOSIS — I10 Essential (primary) hypertension: Secondary | ICD-10-CM

## 2022-01-25 DIAGNOSIS — R251 Tremor, unspecified: Secondary | ICD-10-CM

## 2022-01-25 NOTE — Patient Instructions (Signed)

## 2022-02-05 ENCOUNTER — Other Ambulatory Visit: Payer: Self-pay | Admitting: Cardiovascular Disease

## 2022-02-05 DIAGNOSIS — I6523 Occlusion and stenosis of bilateral carotid arteries: Secondary | ICD-10-CM

## 2022-02-06 NOTE — Patient Instructions (Signed)

## 2022-02-07 ENCOUNTER — Ambulatory Visit: Payer: Medicare Other | Admitting: Nurse Practitioner

## 2022-02-07 ENCOUNTER — Encounter: Payer: Self-pay | Admitting: Nurse Practitioner

## 2022-02-07 VITALS — BP 148/70 | HR 67 | Ht 69.0 in | Wt 207.2 lb

## 2022-02-07 DIAGNOSIS — E039 Hypothyroidism, unspecified: Secondary | ICD-10-CM | POA: Diagnosis not present

## 2022-02-07 DIAGNOSIS — N1831 Chronic kidney disease, stage 3a: Secondary | ICD-10-CM

## 2022-02-07 DIAGNOSIS — I10 Essential (primary) hypertension: Secondary | ICD-10-CM | POA: Diagnosis not present

## 2022-02-07 DIAGNOSIS — E782 Mixed hyperlipidemia: Secondary | ICD-10-CM

## 2022-02-07 DIAGNOSIS — E1122 Type 2 diabetes mellitus with diabetic chronic kidney disease: Secondary | ICD-10-CM

## 2022-02-07 NOTE — Progress Notes (Signed)
Endocrinology Consult Note       02/07/2022, 12:12 PM   Subjective:    Patient ID: Kyle Dunlap, male    DOB: [birth year].  Kyle Dunlap is being seen in consultation for management of currently uncontrolled symptomatic diabetes requested by  Coral Spikes, DO.   Past Medical History:  Diagnosis Date   Adenomatous colon polyp 11/22/2008   Dr. Gala Romney   Allergy    Carotid artery stenosis    Chronic back pain    CKD (chronic kidney disease)    DDD (degenerative disc disease), lumbar    GERD (gastroesophageal reflux disease)    Heart murmur 2023   Hemorrhoids, external    High cholesterol    History of kidney stones 2013   HTN (hypertension)    Hyperplastic colon polyp 11/22/2008   Dr. Gala Romney   Hypothyroidism    Kidney stone    "14 years ago" per pt   Meniere's disease    Meniere's disease    Pneumonia 1965   PTSD (post-traumatic stress disorder)    Tachyarrhythmia    Type 2 diabetes mellitus ([place])     Past Surgical History:  Procedure Laterality Date   CATARACT EXTRACTION W/PHACO Left 09/28/2016   Procedure: CATARACT EXTRACTION PHACO AND INTRAOCULAR LENS PLACEMENT ([place]);  Surgeon: Baruch Goldmann, MD;  Location: [facility];  Service: Ophthalmology;  Laterality: Left;  CDE: 4.40   CATARACT EXTRACTION W/PHACO Right 10/26/2016   Procedure: CATARACT EXTRACTION PHACO AND INTRAOCULAR LENS PLACEMENT RIGHT EYE;  Surgeon: Baruch Goldmann, MD;  Location: [facility];  Service: Ophthalmology;  Laterality: Right;  CDE: 6.74   CHOLECYSTECTOMY     CHOLECYSTECTOMY     COLONOSCOPY  12/10/2008   Dr. Gala Romney- L side diverticula, adenomatous polyp, hyperplastic polyp   COLONOSCOPY  11/16/2003   Dr. Laural Golden- performed exam to cecum,3 polyps- no path report available, external hemorrhoids,    COLONOSCOPY N/A 01/04/2014   Procedure: COLONOSCOPY;  Surgeon: Daneil Dolin, MD;  Location: [facility];  Service: Endoscopy;   Laterality: N/A;  8:30am   COLONOSCOPY N/A 02/20/2017   Surgeon: Daneil Dolin, MD; multiple colon polyps ranging 3 to 9 mm in size.  Pathology with tubular adenomas.  Recommended repeat in 3 years.   COLONOSCOPY WITH PROPOFOL N/A 10/03/2020   Procedure: COLONOSCOPY WITH PROPOFOL;  Surgeon: Daneil Dolin, MD;  Location: [facility];  Service: Endoscopy;  Laterality: N/A;  8:30am   ENDARTERECTOMY Left 11/20/2021   Procedure: LEFT CAROTID ENDARTERECTOMY;  Surgeon: Broadus John, MD;  Location: [facility] OR;  Service: Vascular;  Laterality: Left;   ESOPHAGOGASTRODUODENOSCOPY  08/31/2011   AST:MHDQQI esophageal erosions consistent with mild erosive reflux esophagitis   MOUTH SURGERY     POLYPECTOMY  10/03/2020   Procedure: POLYPECTOMY;  Surgeon: Daneil Dolin, MD;  Location: [facility];  Service: Endoscopy;;    Social History   Socioeconomic History   Marital status: Married    Spouse name: Juliann Pulse   Number of children: 2   Years of education: Not on file   Highest education level: Not on file  Occupational History   Occupation: retired    Comment: electrician  Tobacco Use  Smoking status: Former    Packs/day: 2.00    Years: 24.00    Total pack years: 48.00    Types: Cigarettes    Quit date: 1985    Years since quitting: 39.0   Smokeless tobacco: Never  Vaping Use   Vaping Use: Never used  Substance and Sexual Activity   Alcohol use: Yes    Comment: occasionally- 4-5 mixed drinks per week.   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  (10/26/2021)   Overall Financial Resource Strain (CARDIA)    Difficulty of Paying Living Expenses: Not hard at all  Food Insecurity: No Food Insecurity (11/21/2021)   Hunger Vital Sign    Worried About Running Out of Food in the Last Year: Never  true    Ran Out of Food in the Last Year: Never true  Transportation Needs: No Transportation Needs (11/21/2021)   PRAPARE - Hydrologist (Medical): No    Lack of Transportation (Non-Medical): No  Physical Activity: Inactive (10/26/2021)   Exercise Vital Sign    Days of Exercise per Week: 0 days    Minutes of Exercise per Session: 0 min  Stress: No Stress Concern Present (10/26/2021)   [place]    Feeling of Stress : Not at all  Social Connections: Moderately Integrated (10/26/2021)   Social Connection and Isolation Panel [NHANES]    Frequency of Communication with Friends and Family: Twice a week    Frequency of Social Gatherings with Friends and Family: Twice a week    Attends Religious Services: More than 4 times per year    Active Member of [employer] or Organizations: No    Attends Archivist Meetings: Never    Marital Status: Married    Family History  Problem Relation Age of Onset   Lung cancer Mother    Heart attack Father    Colon cancer Brother 66   Alzheimer's disease Brother    Alzheimer's disease Brother    Alzheimer's disease Brother    Ankylosing spondylitis Child    Thyroid nodules Child     Outpatient Encounter Medications as of 02/07/2022  Medication Sig   acetaminophen (TYLENOL) 325 MG tablet Take 650 mg by mouth every 6 (six) hours as needed for moderate pain.   albuterol (VENTOLIN HFA) 108 (90 Base) MCG/ACT inhaler TAKE 2 PUFFS BY MOUTH EVERY 6 HOURS AS NEEDED FOR WHEEZE OR SHORTNESS OF BREATH   Alirocumab (PRALUENT) 150 MG/ML SOAJ INJECT 1 PEN INTO THE SKIN EVERY 14 (FOURTEEN) DAYS.   Ascorbic Acid (VITAMIN C) 1000 MG tablet Take 1,000 mg by mouth 3 (three) times a week.   aspirin 81 MG tablet Take 81 mg by mouth daily.   b complex vitamins capsule Take 1 capsule by mouth once a week.   Cholecalciferol (VITAMIN D3) 125 MCG (5000 UT) CAPS Take 5,000 Units by  mouth 2 (two) times a week.   clopidogrel (PLAVIX) 75 MG tablet Take 1 tablet (75 mg total) by mouth daily.   diazepam (VALIUM) 2 MG tablet TAKE ONE TABLET ('2MG'$  TOTAL) BY MOUTH DAILY AS NEEDED FOR DIZZINESS SECONDARY TO MENIERE'S DISEASE (Patient taking differently: Take 2 mg by mouth daily. MENIERE'S DISEASE)  diclofenac Sodium (VOLTAREN) 1 % GEL Apply 2 g topically daily as needed (pain).   DULoxetine (CYMBALTA) 60 MG capsule Take 1 capsule (60 mg total) by mouth daily.   enalapril (VASOTEC) 20 MG tablet TAKE 1 TABLET BY MOUTH EVERY DAY   glipiZIDE (GLUCOTROL) 5 MG tablet Take 1 tablet (5 mg total) by mouth 2 (two) times daily before a meal.   hydrALAZINE (APRESOLINE) 25 MG tablet Take 50 mg by mouth 2 (two) times daily.   levothyroxine (SYNTHROID) 25 MCG tablet TAKE ONE TABLET BY MOUTH DAILY FOR HYPOTHYROIDISM   Multiple Vitamin (MULTIVITAMIN WITH MINERALS) TABS Take 1 tablet by mouth daily.   pantoprazole (PROTONIX) 40 MG tablet TAKE 1 TABLET BY MOUTH EVERY DAY   sodium chloride (OCEAN) 0.65 % SOLN nasal spray Place 1 spray into both nostrils at bedtime.   TIADYLT ER 360 MG 24 hr capsule TAKE 1 CAPSULE BY MOUTH EVERY DAY   traMADol (ULTRAM) 50 MG tablet Take 1 tablet (50 mg total) by mouth every 6 (six) hours as needed. (Patient not taking: Reported on 01/25/2022)   No facility-administered encounter medications on file as of 02/07/2022.    ALLERGIES: Allergies  Allergen Reactions   Atorvastatin Other (See Comments)    Joint and muscle aches, weakness present   Zetia [Ezetimibe] Other (See Comments)    Elevated liver enzymes   Zocor [Simvastatin] Other (See Comments)    Joint and muscle aches   Codeine Other (See Comments)    Passed out    VACCINATION STATUS: Immunization History  Administered Date(s) Administered   Fluad Quad(high Dose 65+) 11/15/2020, 10/24/2021   H1N1 01/01/2008   Influenza,inj,Quad PF,6+ Mos 10/22/2014, 10/25/2015, 11/07/2016, 10/23/2017, 10/24/2018,  10/05/2019   Influenza-Unspecified 10/23/2010, 10/28/2012, 10/22/2013, 10/28/2019   Moderna Sars-Covid-2 Vaccination 03/09/2019, 04/06/2019, 11/17/2019   PFIZER(Purple Top)SARS-COV-2 Vaccination 02/17/2019, 03/10/2019, 10/22/2019   Pneumococcal Conjugate-13 03/15/2014   Pneumococcal Polysaccharide-23 09/22/2009   Td 07/17/2007   Tdap 09/29/2021   Zoster Recombinat (Shingrix) 03/10/2020   Zoster, Live 03/05/2013    Diabetes He presents for his follow-up diabetic visit. He has type 2 diabetes mellitus. Onset time: Diagnosed at approx age of 23. His disease course has been improving. Hypoglycemia symptoms include nervousness/anxiousness, sweats and tremors. Associated symptoms include fatigue and foot paresthesias. There are no hypoglycemic complications. Symptoms are stable. Diabetic complications include nephropathy and peripheral neuropathy. Risk factors for coronary artery disease include diabetes mellitus, dyslipidemia, family history, male sex, hypertension and sedentary lifestyle. Current diabetic treatment includes oral agent (monotherapy). He is compliant with treatment most of the time. His weight is decreasing steadily. He is following a generally healthy diet. When asked about meal planning, he reported none. He has not had a previous visit with a dietitian. He rarely participates in exercise. His home blood glucose trend is decreasing steadily. His breakfast blood glucose range is generally 110-130 mg/dl. His bedtime blood glucose range is generally 90-110 mg/dl. (He presents today, accompanied by his wife, with his logs showing at goal fasting and tight postprandial readings.  He was not due for another A1c today.  He did experience mild hypoglycemia on rare occasions.  He has done better with his diet. ) An ACE inhibitor/angiotensin II receptor blocker is being taken. He sees a podiatrist.Eye exam is current.     Review of systems  Constitutional: + slowly decreasing body weight,  current Body mass index is 30.6 kg/m., no fatigue, no subjective hyperthermia, no subjective hypothermia, HOH Eyes: no blurry vision, no xerophthalmia ENT: no  sore throat, no nodules palpated in throat, no dysphagia/odynophagia, no hoarseness Cardiovascular: no chest pain, no shortness of breath, no palpitations, no leg swelling Respiratory: no cough, no shortness of breath Gastrointestinal: no nausea/vomiting/diarrhea Musculoskeletal: no muscle/joint aches Skin: no rashes, no hyperemia Neurological: no tremors, + numbness/tingling to BLE, no dizziness Psychiatric: no depression, no anxiety  Objective:     BP (!) 148/70 (BP Location: Right Arm, Patient Position: Sitting, Cuff Size: Large) Comment: Manuel Cuff - patient's wife states that the patient 's BP is always like this at office visiits.  Pulse 67   Ht '5\' 9"'$  (1.753 m)   Wt 207 lb 3.2 oz (94 kg)   BMI 30.60 kg/m   Wt Readings from Last 3 Encounters:  02/07/22 207 lb 3.2 oz (94 kg)  01/25/22 208 lb 9.6 oz (94.6 kg)  01/10/22 209 lb (94.8 kg)     BP Readings from Last 3 Encounters:  02/07/22 (!) 148/70  01/25/22 (!) 194/72  01/10/22 (!) 151/62     Physical Exam- Limited  Constitutional:  Body mass index is 30.6 kg/m. , not in acute distress, normal state of mind Eyes:  EOMI, no exophthalmos Musculoskeletal: no gross deformities, strength intact in all four extremities, no gross restriction of joint movements Skin:  no rashes, no hyperemia Neurological: no tremor with outstretched hands  Diabetic Foot Exam - Simple   Simple Foot Form Diabetic Foot exam was performed with the following findings: Yes 02/07/2022 11:49 AM  Visual Inspection No deformities, no ulcerations, no other skin breakdown bilaterally: Yes Sensation Testing Intact to touch and monofilament testing bilaterally: Yes Pulse Check Posterior Tibialis and Dorsalis pulse intact bilaterally: Yes Comments     CMP ( most recent) CMP     Component  Value Date/Time   NA 132 (L) 12/01/2021 1443   K 5.9 (H) 12/01/2021 1443   CL 93 (L) 12/01/2021 1443   CO2 26 12/01/2021 1443   GLUCOSE 149 (H) 12/01/2021 1443   GLUCOSE 212 (H) 11/21/2021 0410   BUN 22 12/01/2021 1443   CREATININE 1.47 (H) 12/01/2021 1443   CREATININE 1.22 03/15/2014 1054   CALCIUM 10.5 (H) 12/01/2021 1443   PROT 7.8 12/01/2021 1443   ALBUMIN 4.7 12/01/2021 1443   AST 26 12/01/2021 1443   ALT 31 12/01/2021 1443   ALKPHOS 136 (H) 12/01/2021 1443   BILITOT 0.3 12/01/2021 1443   GFRNONAA 51 (L) 11/21/2021 0410   GFRAA 48 (L) 01/01/2020 1101     Diabetic Labs (most recent): Lab Results  Component Value Date   HGBA1C 7.8 (H) 12/01/2021   HGBA1C 7.7 (H) 08/11/2021   HGBA1C 8.1 (H) 11/15/2020   MICROALBUR 60.2 (H) 03/15/2014     Lipid Panel ( most recent) Lipid Panel     Component Value Date/Time   CHOL 203 (H) 08/11/2021 1352   TRIG 245 (H) 08/11/2021 1352   HDL 39 (L) 08/11/2021 1352   CHOLHDL 5.2 (H) 08/11/2021 1352   CHOLHDL 7.3 03/15/2014 1054   VLDL 45 (H) 03/15/2014 1054   LDLCALC 121 (H) 08/11/2021 1352   LABVLDL 43 (H) 08/11/2021 1352      Lab Results  Component Value Date   TSH 2.060 08/11/2021           Assessment & Plan:   1) Type 2 diabetes mellitus with stage 3a chronic kidney disease, without long-term current use of insulin ([place])  He presents today, accompanied by his wife, with his logs showing at goal fasting and tight postprandial  readings.  He was not due for another A1c today.  He did experience mild hypoglycemia on rare occasions.  He has done better with his diet.   - Kyle Dunlap has currently uncontrolled symptomatic type 2 DM since 77 years of age, with most recent A1c of 7.8 %.   -Recent labs reviewed.  - I had a long discussion with him about the progressive nature of diabetes and the pathology behind its complications. -his diabetes is complicated by CKD stage 3 and neuropathy and he remains at a high risk  for more acute and chronic complications which include CAD, CVA, CKD, retinopathy, and neuropathy. These are all discussed in detail with him.  The following Lifestyle Medicine recommendations according to [facility]) were discussed and offered to patient and he agrees to start the journey:  A. Whole Foods, Plant-based plate comprising of fruits and vegetables, plant-based proteins, whole-grain carbohydrates was discussed in detail with the patient.   A list for source of those nutrients were also provided to the patient.  Patient will use only water or unsweetened tea for hydration. B.  The need to stay away from risky substances including alcohol, smoking; obtaining 7 to 9 hours of restorative sleep, at least 150 minutes of moderate intensity exercise weekly, the importance of healthy social connections,  and stress reduction techniques were discussed. C.  A full color page of  Calorie density of various food groups per pound showing examples of each food groups was provided to the patient.  - Nutritional counseling repeated at each appointment due to patients tendency to fall back in to old habits.  - The patient admits there is a room for improvement in their diet and drink choices. -  Suggestion is made for the patient to avoid simple carbohydrates from their diet including Cakes, Sweet Desserts / Pastries, Ice Cream, Soda (diet and regular), Sweet Tea, Candies, Chips, Cookies, Sweet Pastries, Store Bought Juices, Alcohol in Excess of 1-2 drinks a day, Artificial Sweeteners, Coffee Creamer, and "Sugar-free" Products. This will help patient to have stable blood glucose profile and potentially avoid unintended weight gain.   - I encouraged the patient to switch to unprocessed or minimally processed complex starch and increased protein intake (animal or plant source), fruits, and vegetables.   - Patient is advised to stick to a routine mealtimes to eat 3 meals a day  and avoid unnecessary snacks (to snack only to correct hypoglycemia).  - I have approached him with the following individualized plan to manage his diabetes and patient agrees:   - Given his advanced age, avoiding hypoglycemia is a top priority for him.  As such, an appropriate A1c target for him would be around 7-7.5%.   -He is advised to continue Glipizide to 5 mg po twice daily with meals.  We have the opportunity to lower further on future visits (has refill being delivered soon of the 10 mg tablets of which he is taking half). -he is encouraged to continue monitoring glucose twice daily, before breakfast and before bed, and to call the clinic if he has readings less than 70 or above 300 for 3 tests in a row.  - Adjustment parameters are given to him for hypo and hyperglycemia in writing.  - he is not a candidate for Metformin due to concurrent renal insufficiency.  He was previously on Jardiance but kidney function actually got worse on this medication, thus it was discontinued.  - he will be considered  for incretin therapy as appropriate next visit.  - Specific targets for  A1c; LDL, HDL, and Triglycerides were discussed with the patient.  2) Blood Pressure /Hypertension:  his blood pressure is controlled to target.   he is advised to continue his current medications including Enalapril 20 mg p.o. daily with breakfast and Hydralazine 50 mg po twice daily.  3) Lipids/Hyperlipidemia:    Review of his recent lipid panel from 08/11/21 showed uncontrolled LDL at 121 and elevated triglycerides of 245.  He is on Praluent 150 mg injections every 2 weeks, has had adverse reactions to both statin and Zetia in the past.   4)  Weight/Diet:  his Body mass index is 30.6 kg/m.  -  clearly complicating his diabetes care.   he is a candidate for weight loss. I discussed with him the fact that loss of 5 - 10% of his  current body weight will have the most impact on his diabetes management.  Exercise, and  detailed carbohydrates information provided  -  detailed on discharge instructions.  5) Chronic Care/Health Maintenance: -he on ACEI/ARB and Praluent medications and is encouraged to initiate and continue to follow up with Ophthalmology, Dentist, Podiatrist at least yearly or according to recommendations, and advised to stay away from smoking. I have recommended yearly flu vaccine and pneumonia vaccine at least every 5 years; moderate intensity exercise for up to 150 minutes weekly; and sleep for at least 7 hours a day.  - he is advised to maintain close follow up with Coral Spikes, DO for primary care needs, as well as his other providers for optimal and coordinated care.    I spent 41 minutes in the care of the patient today including review of labs from [place], Lipids, Thyroid Function, Hematology (current and previous including abstractions from other facilities); face-to-face time discussing  his blood glucose readings/logs, discussing hypoglycemia and hyperglycemia episodes and symptoms, medications doses, his options of short and long term treatment based on the latest standards of care / guidelines;  discussion about incorporating lifestyle medicine;  and documenting the encounter. Risk reduction counseling performed per USPSTF guidelines to reduce obesity and cardiovascular risk factors.     Please refer to Patient Instructions for Blood Glucose Monitoring and Insulin/Medications Dosing Guide"  in media tab for additional information. Please  also refer to " Patient Self Inventory" in the Media  tab for reviewed elements of pertinent patient history.  Kyle Dunlap participated in the discussions, expressed understanding, and voiced agreement with the above plans.  All questions were answered to his satisfaction. he is encouraged to contact clinic should he have any questions or concerns prior to his return visit.     Follow up plan: - Return for Diabetes F/U with A1c in office, No  previsit labs, Bring meter and logs.   Rayetta Pigg, [facility] [address] [place], [place] [address] Phone: [phone] Fax: [phone]  02/07/2022, 12:12 PM

## 2022-02-09 ENCOUNTER — Other Ambulatory Visit: Payer: Self-pay | Admitting: Family Medicine

## 2022-02-09 ENCOUNTER — Encounter: Payer: Self-pay | Admitting: Neurology

## 2022-02-09 DIAGNOSIS — H8103 Meniere's disease, bilateral: Secondary | ICD-10-CM

## 2022-02-12 ENCOUNTER — Telehealth: Payer: Self-pay

## 2022-02-12 NOTE — Telephone Encounter (Signed)
I received notification from [facility] Scan scheduling that the patient has decided to not follow through with getting the Dat Scan scheduled and he is not interested in doing the scan

## 2022-02-16 ENCOUNTER — Other Ambulatory Visit: Payer: Self-pay | Admitting: Physician Assistant

## 2022-02-16 IMAGING — DX DG HIP (WITH OR WITHOUT PELVIS) 2-3V*R*
3 series · 3 of 3 positions shown · non-contrast
Comparison: X-ray right hand 09/19/2025.

CLINICAL DATA: right hip pain; low back pain

EXAM:
DG HIP (WITH OR WITHOUT PELVIS) 2-3V RIGHT

[pelvis ap]
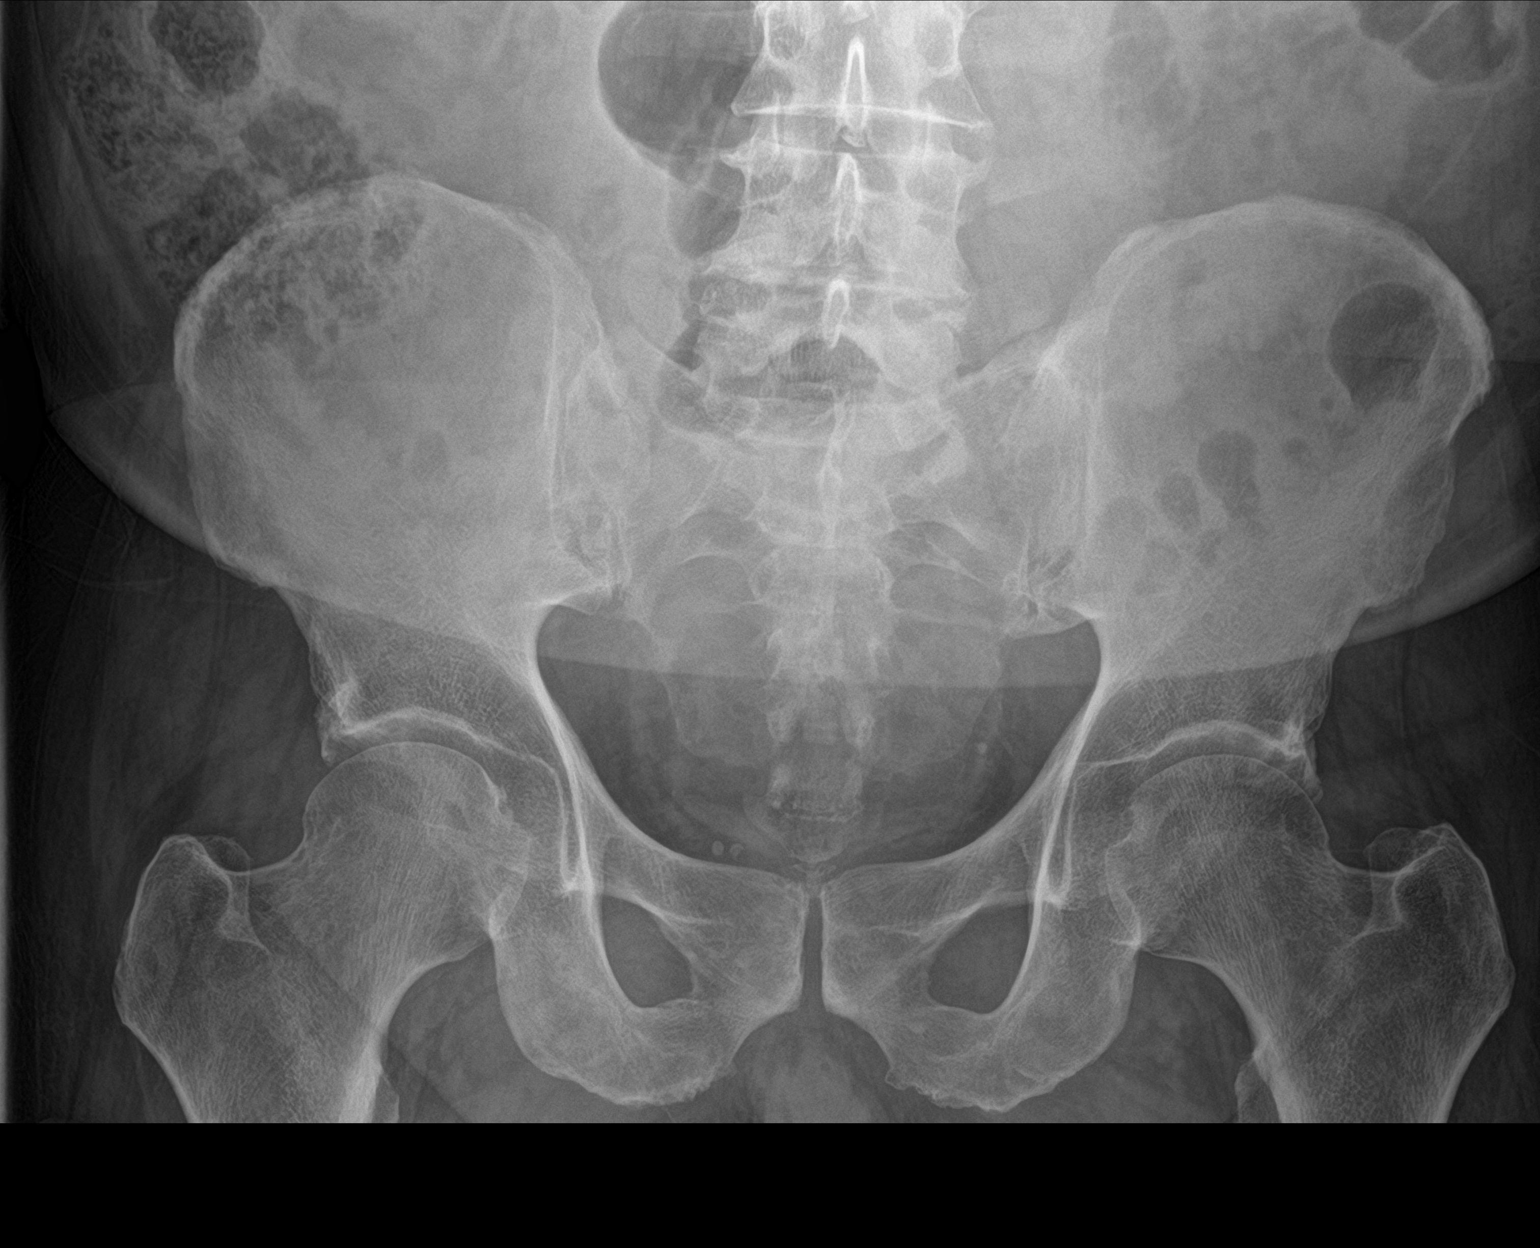

[hip ap]
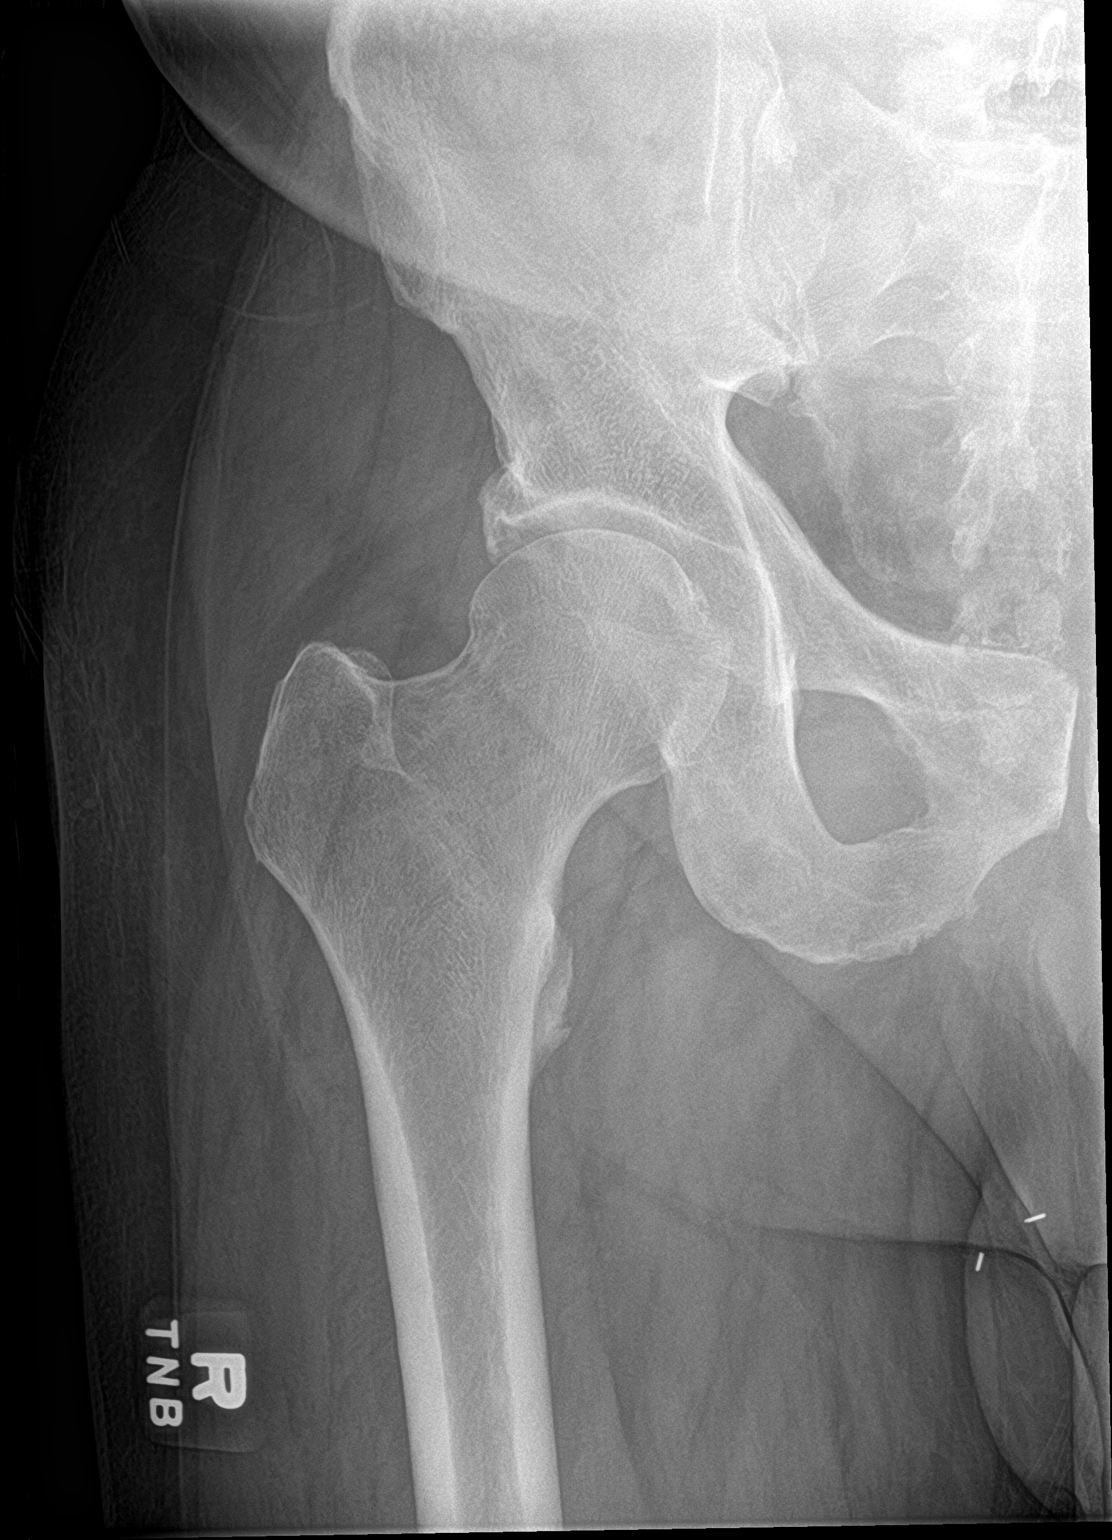

[hip frog leg]
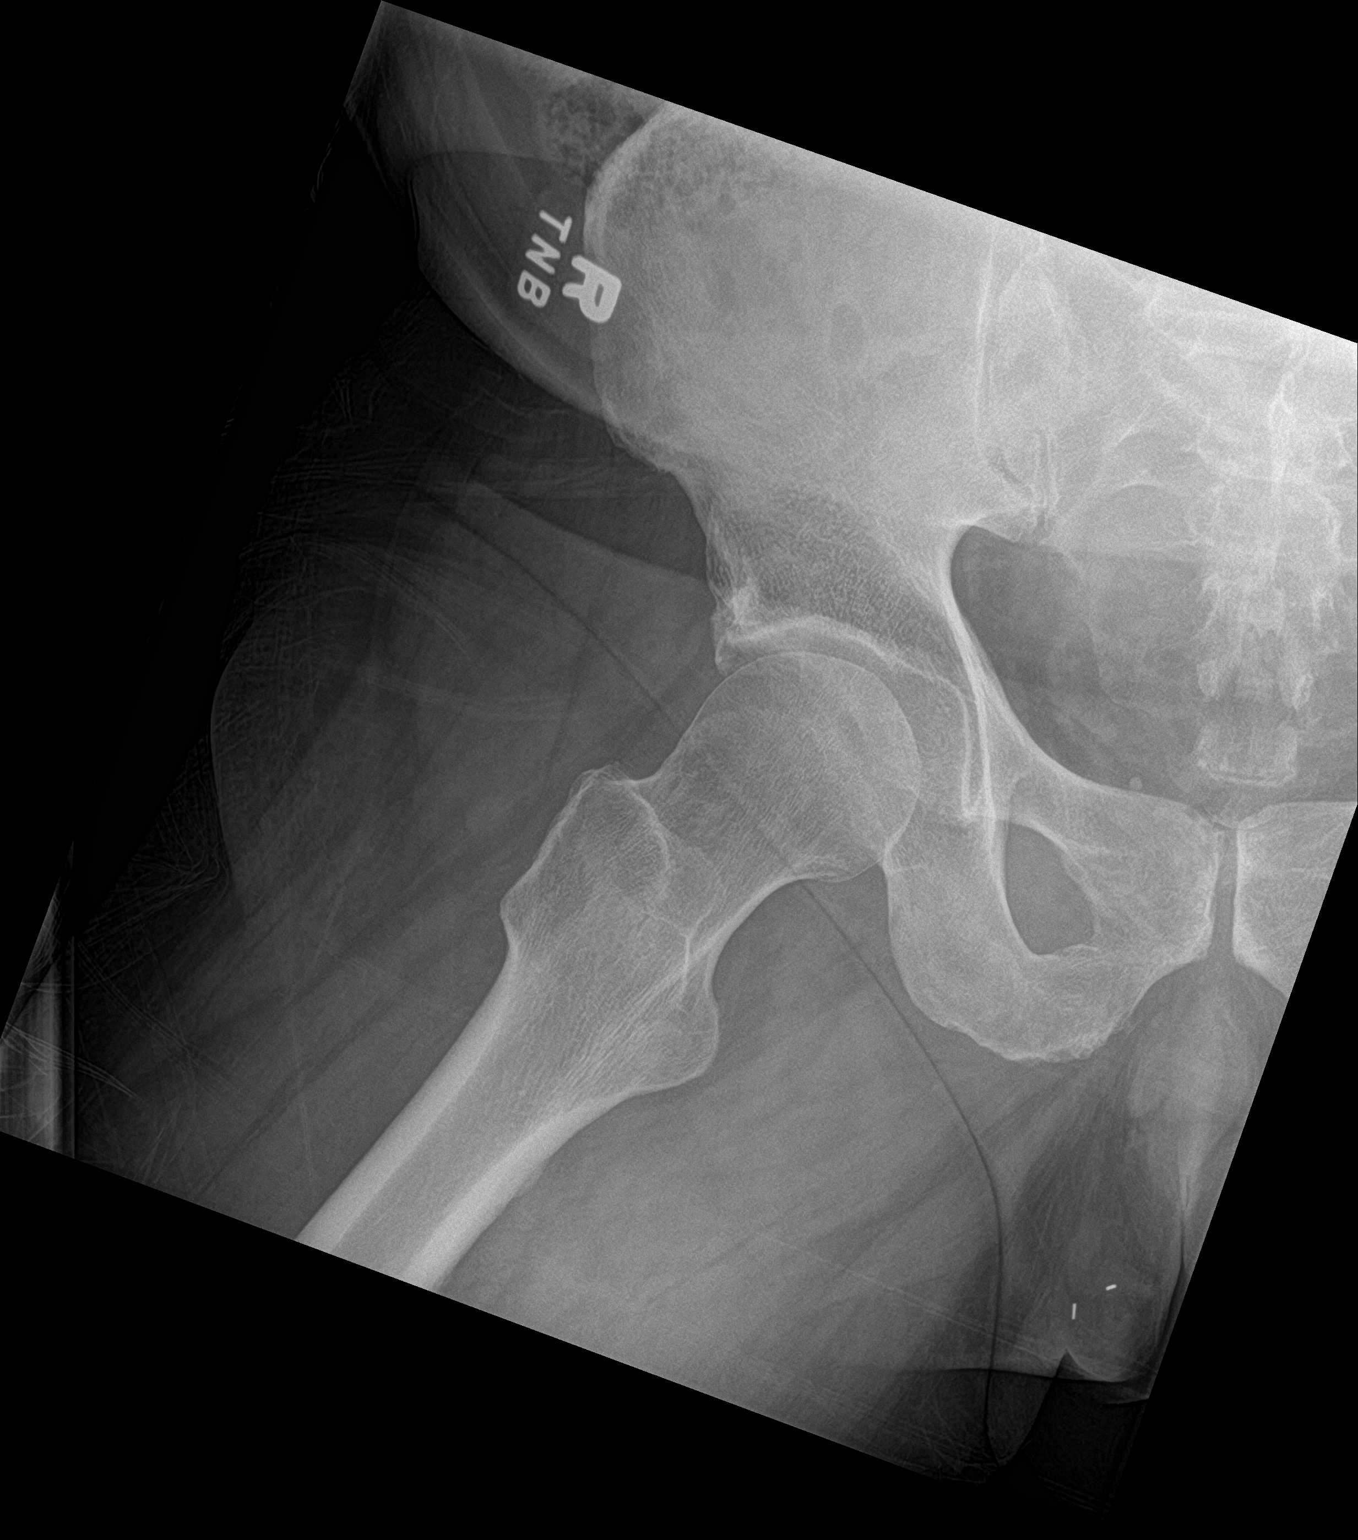

[3 of 3 positions shown; findings below may reference images not displayed]

FINDINGS: There is no evidence of hip fracture or dislocation. No acute
displaced fracture or pelvic diastasis. Degenerative changes of the
visualized lower lumbar spine. There is no evidence of arthropathy
or other focal bone abnormality.
IMPRESSION: Negative.

## 2022-02-16 IMAGING — DX DG LUMBAR SPINE COMPLETE 4+V
5 series · 5 of 5 positions shown · non-contrast
Comparison: None.

CLINICAL DATA: Low back pain.

EXAM:
LUMBAR SPINE - COMPLETE 4+ VIEW

[l-spine ap]
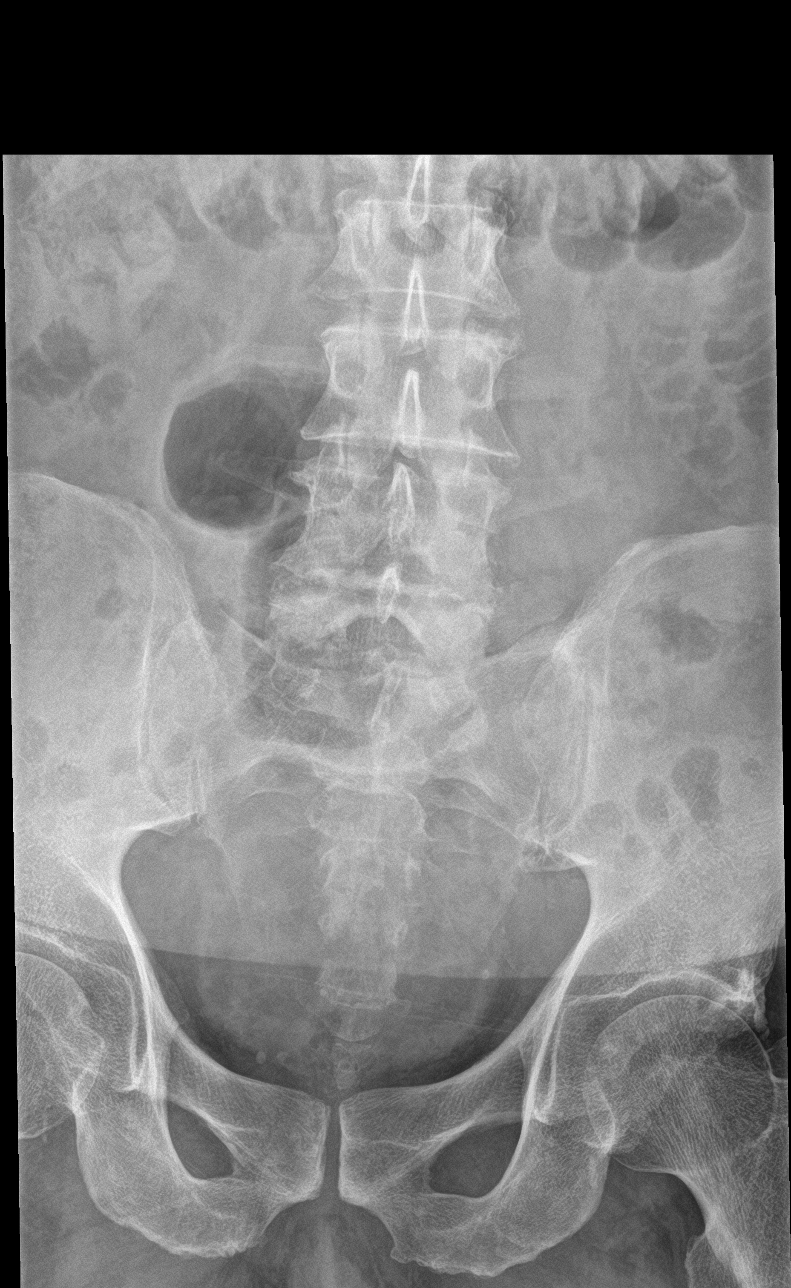

[l-spine obl (1 of 2)]
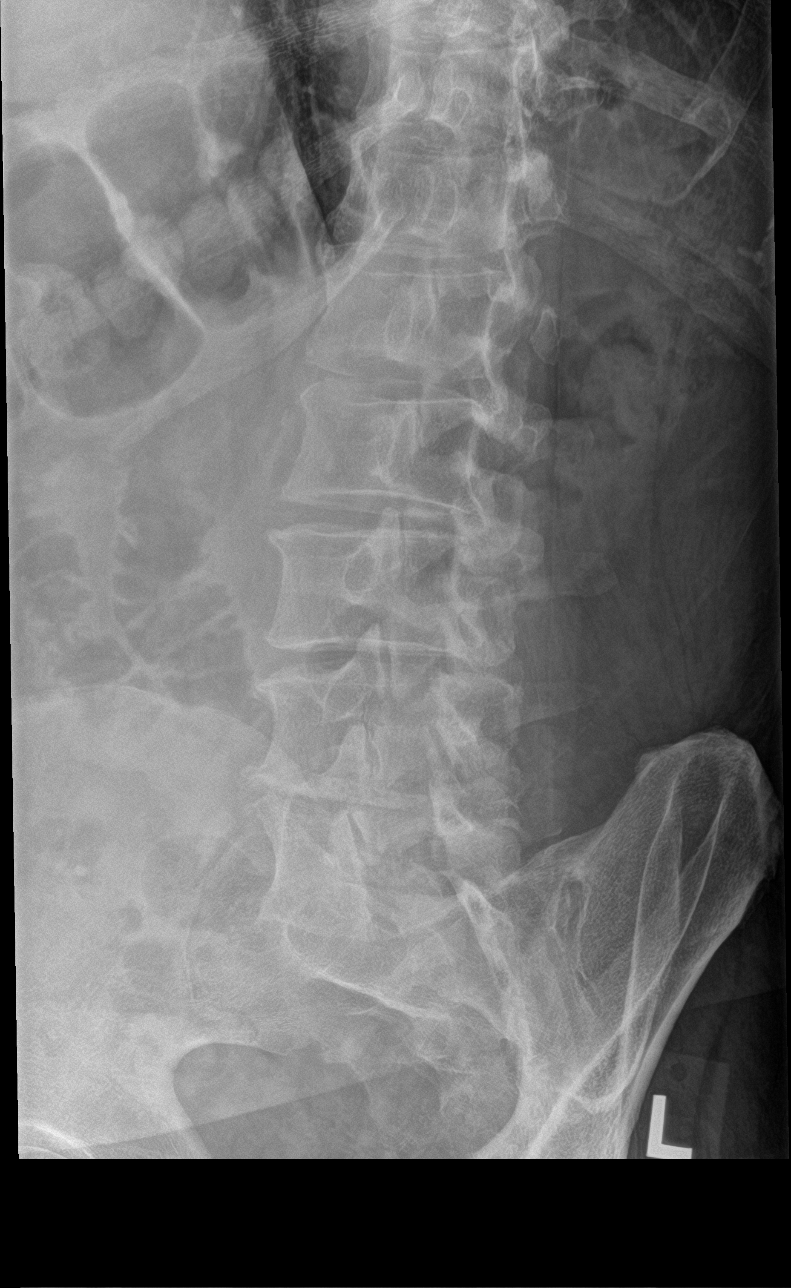

[l-spine obl (2 of 2)]
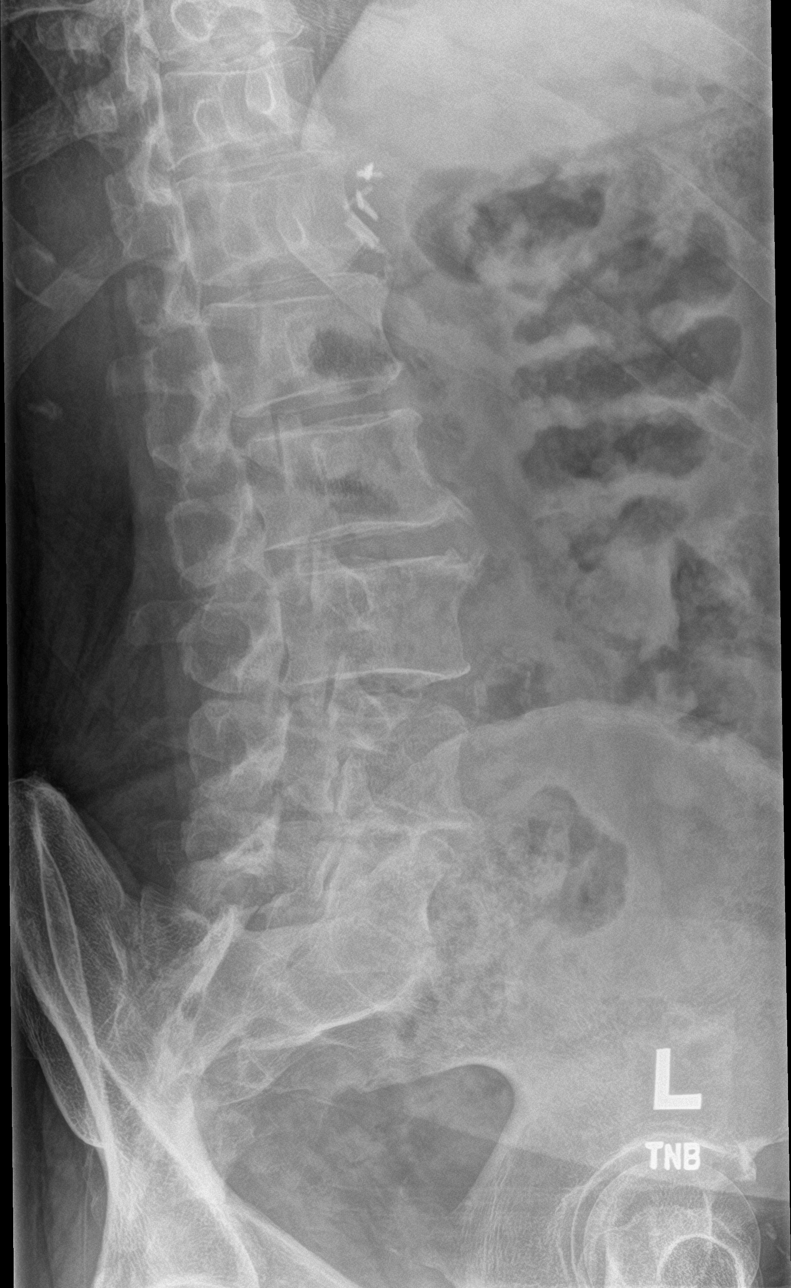

[l-spine lat]
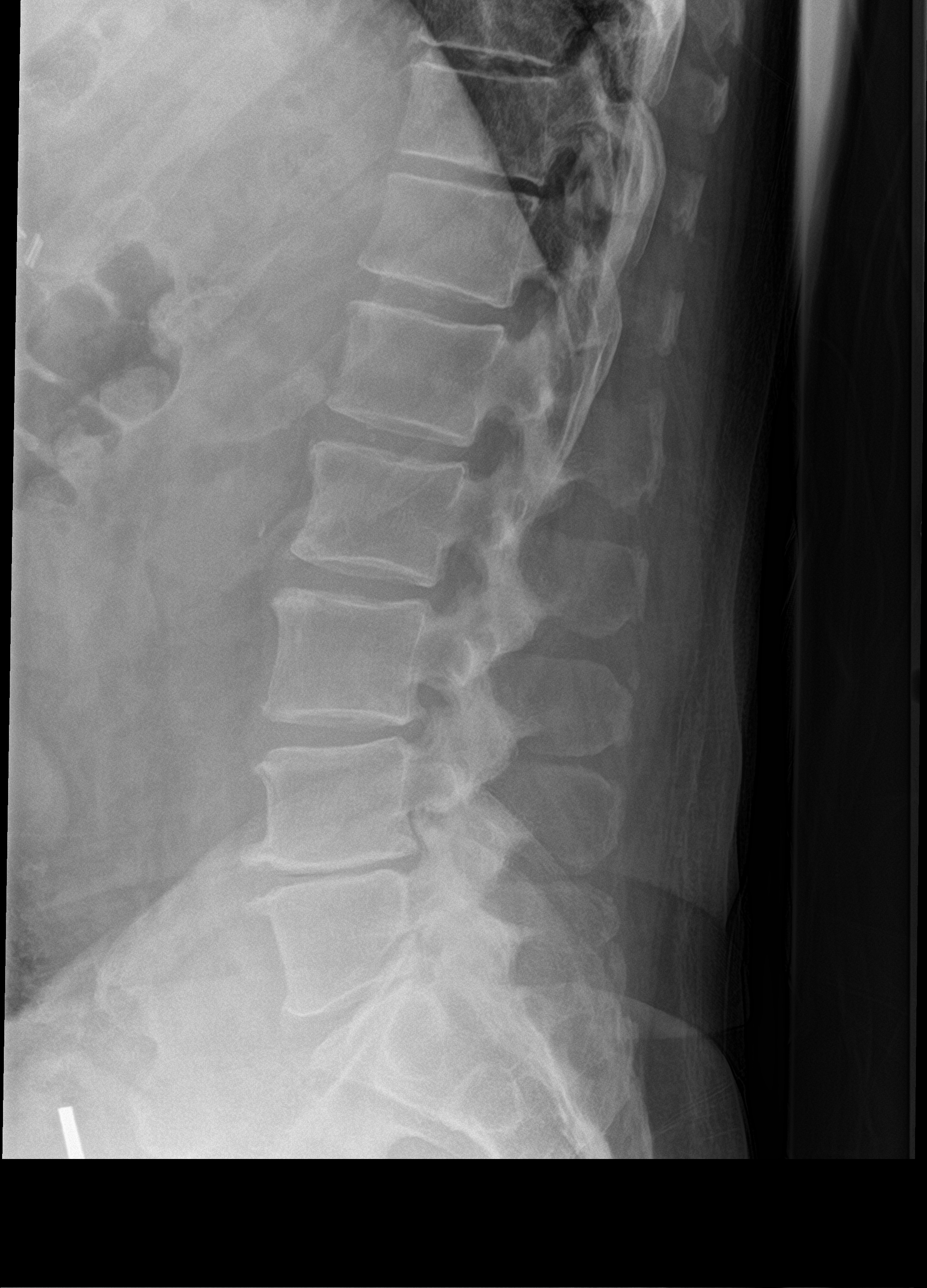

[l-spine spot]
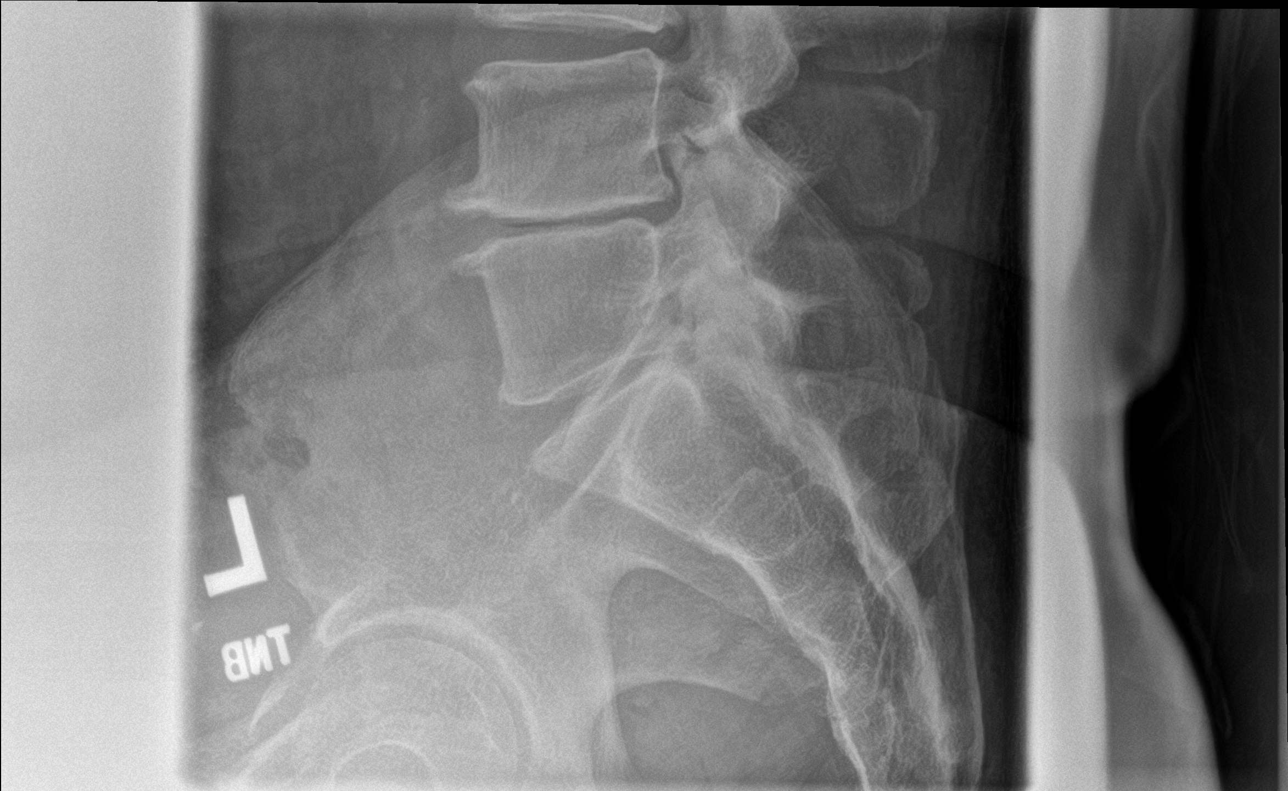

[5 of 5 positions shown; findings below may reference images not displayed]

FINDINGS: Markedly limited evaluation due to overlapping osseous structures
and overlying soft tissues. Five non-rib-bearing lumbar vertebral
bodies. There is no evidence of lumbar spine fracture. Degenerative
changes of the spine at the L4-L5 and L5-S1 alignment is normal.

Right upper quadrant surgical clips.  Atherosclerotic plaque.
IMPRESSION: 1. No acute displaced fracture or traumatic listhesis of the lumbar
spine.
2.  Aortic Atherosclerosis ([id number]).

## 2022-03-02 ENCOUNTER — Other Ambulatory Visit: Payer: Self-pay

## 2022-03-02 ENCOUNTER — Ambulatory Visit: Payer: Medicare Other | Admitting: Family Medicine

## 2022-03-02 DIAGNOSIS — R748 Abnormal levels of other serum enzymes: Secondary | ICD-10-CM

## 2022-03-02 DIAGNOSIS — I10 Essential (primary) hypertension: Secondary | ICD-10-CM

## 2022-03-02 DIAGNOSIS — E1121 Type 2 diabetes mellitus with diabetic nephropathy: Secondary | ICD-10-CM | POA: Diagnosis not present

## 2022-03-02 DIAGNOSIS — N1832 Chronic kidney disease, stage 3b: Secondary | ICD-10-CM | POA: Diagnosis not present

## 2022-03-02 DIAGNOSIS — R0602 Shortness of breath: Secondary | ICD-10-CM

## 2022-03-02 DIAGNOSIS — R0600 Dyspnea, unspecified: Secondary | ICD-10-CM

## 2022-03-02 DIAGNOSIS — E785 Hyperlipidemia, unspecified: Secondary | ICD-10-CM

## 2022-03-02 MED ORDER — ENALAPRIL MALEATE 20 MG PO TABS: 10.0000 mg | ORAL_TABLET | Freq: Every day | ORAL | 1 refills | Status: DC

## 2022-03-02 NOTE — Assessment & Plan Note (Signed)
Patient needs labs given prior abnormal labs.  Discussed possibility of starting [place].  Patient will discuss this with his nephrologist.  I will start if his labs are favorable to do so.

## 2022-03-02 NOTE — Assessment & Plan Note (Signed)
Mildly elevated today.  Will continue to monitor closely.  Continue enalapril, diltiazem, and hydralazine.

## 2022-03-02 NOTE — Assessment & Plan Note (Signed)
Etiology unclear.  Cardiology has recommended echocardiogram.  Will go ahead and order.

## 2022-03-02 NOTE — Assessment & Plan Note (Signed)
Patient to continue glipizide.  If blood sugar remains elevated will discuss addition of insulin.

## 2022-03-02 NOTE — Patient Instructions (Signed)
Labs today.  Consider [place] Clamp. Discuss with nephrology. If labs are favorable, I will start.  Follow up in 3 months.

## 2022-03-02 NOTE — Progress Notes (Signed)
Subjective:  Patient ID: Kyle Dunlap, male    DOB: 11/03/1945  Age: 77 y.o. MRN: [id number]  CC: Chief Complaint  Patient presents with   Diabetes    HPI:  77 year old male with an extensive PMH presents for follow up.  Has CKD 3, followed by nephrology at the [facility].  Most recent microalbumin to creatinine ratio 538.  Currently on enalapril.  Dose has been decreased to 10 mg daily.  He has previously been on Jardiance but did not tolerate and was taken off by his nephrologist.  Will discuss this today.  Blood pressure mildly elevated here today.  He is on hydralazine 50 mg twice daily, diltiazem, and enalapril 10 mg daily.  Patient's lipids are uncontrolled.  He is on Praluent.  Does not tolerate statin.  Does not tolerate Zetia.  Patient is now being followed by endocrinology regarding his type 2 diabetes.  He is on glipizide.  I feel that he needs better control.  He cannot have metformin due to renal insufficiency.  No recent hypoglycemia.  Blood sugar was 82 this morning.  Patient reports that he has been experiencing ongoing shortness of breath.  States that it occurs with exertion.  Followed by cardiology.  Patient Active Problem List   Diagnosis Date Noted   SOB (shortness of breath) 03/02/2022   Cogwheel rigidity 12/04/2021   Carotid artery stenosis 11/20/2021   Generalized anxiety disorder 10/26/2021   Hypothyroidism 10/26/2021   Type 2 diabetes mellitus with diabetic nephropathy, without long-term current use of insulin ([place]) 11/15/2020   Stage 3b chronic kidney disease ([place]) 11/15/2020   Chronic right-sided low back pain without sciatica 11/15/2020   Diabetic neuropathy ([place]) 11/15/2020   Statin myopathy 05/09/2020   Aortic atherosclerosis ([place]) 05/06/2020   Meniere's disease 03/20/2015   Hyperlipidemia LDL goal <70 07/15/2012   Essential hypertension, benign 07/15/2012   GERD (gastroesophageal reflux disease) 08/03/2011    Social Hx   Social History    Socioeconomic History   Marital status: Married    Spouse name: Juliann Pulse   Number of children: 2   Years of education: Not on file   Highest education level: Not on file  Occupational History   Occupation: retired    Comment: Clinical biochemist  Tobacco Use   Smoking status: Former    Packs/day: 2.00    Years: 24.00    Total pack years: 48.00    Types: Cigarettes    Quit date: 1985    Years since quitting: 39.1   Smokeless tobacco: Never  Vaping Use   Vaping Use: Never used  Substance and Sexual Activity   Alcohol use: Yes    Comment: occasionally- 4-5 mixed drinks per week.   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  (10/26/2021)   Overall Financial Resource Strain (CARDIA)    Difficulty of Paying Living Expenses: Not hard at all  Food Insecurity: No Food Insecurity (11/21/2021)   Hunger Vital Sign    Worried About Running Out of Food in the Last Year: Never true    Ran Out of Food in the Last Year: Never true  Transportation Needs: No Transportation Needs (11/21/2021)   PRAPARE - Transportation    Lack of Transportation (Medical): No    Lack  of Transportation (Non-Medical): No  Physical Activity: Inactive (10/26/2021)   Exercise Vital Sign    Days of Exercise per Week: 0 days    Minutes of Exercise per Session: 0 min  Stress: No Stress Concern Present (10/26/2021)   [place]    Feeling of Stress : Not at all  Social Connections: Moderately Integrated (10/26/2021)   Social Connection and Isolation Panel [NHANES]    Frequency of Communication with Friends and Family: Twice a week    Frequency of Social Gatherings with Friends and Family: Twice a week    Attends Religious Services: More than 4 times per year    Active  Member of [employer] or Organizations: No    Attends Music therapist: Never    Marital Status: Married    Review of Systems Per HPI  Objective:  BP (!) 143/65   Pulse (!) 59   Temp 97.7 F (36.5 C)   Ht 5' 9"$  (1.753 m)   Wt 206 lb (93.4 kg)   SpO2 99%   BMI 30.42 kg/m      03/02/2022    1:41 PM 03/02/2022    1:37 PM 02/07/2022   11:36 AM  BP/Weight  Systolic BP [id number]  Diastolic BP 65 65 70  Wt. (Lbs)  206   BMI  30.42 kg/m2     Physical Exam Vitals and nursing note reviewed.  Constitutional:      General: He is not in acute distress.    Appearance: Normal appearance.  HENT:     Head: Normocephalic and atraumatic.  Eyes:     General:        Right eye: No discharge.        Left eye: No discharge.     Conjunctiva/sclera: Conjunctivae normal.  Cardiovascular:     Rate and Rhythm: Normal rate and regular rhythm.     Heart sounds: Murmur heard.  Pulmonary:     Effort: Pulmonary effort is normal.     Breath sounds: No wheezing or rales.  Abdominal:     General: There is no distension.     Palpations: Abdomen is soft.     Tenderness: There is no abdominal tenderness.  Neurological:     Mental Status: He is alert.     Comments: Rigidity noted.  Psychiatric:     Comments: Flat affect.     Lab Results  Component Value Date   WBC 11.2 (H) 12/01/2021   HGB 12.9 (L) 12/01/2021   HCT 38.8 12/01/2021   PLT 473 (H) 12/01/2021   GLUCOSE 149 (H) 12/01/2021   CHOL 203 (H) 08/11/2021   TRIG 245 (H) 08/11/2021   HDL 39 (L) 08/11/2021   LDLCALC 121 (H) 08/11/2021   ALT 31 12/01/2021   AST 26 12/01/2021   NA 132 (L) 12/01/2021   K 5.9 (H) 12/01/2021   CL 93 (L) 12/01/2021   CREATININE 1.47 (H) 12/01/2021   BUN 22 12/01/2021   CO2 26 12/01/2021   TSH 2.060 08/11/2021   PSA 1.92 03/15/2014   INR 1.1 11/14/2021   HGBA1C 7.8 (H) 12/01/2021   MICROALBUR 60.2 (H) 03/15/2014     Assessment & Plan:   Problem List Items Addressed This Visit        Cardiovascular and Mediastinum   Essential hypertension, benign    Mildly elevated today.  Will continue to monitor closely.  Continue enalapril, diltiazem, and hydralazine.  Relevant Medications   enalapril (VASOTEC) 20 MG tablet     Endocrine   Type 2 diabetes mellitus with diabetic nephropathy, without long-term current use of insulin ([place])    Patient to continue glipizide.  If blood sugar remains elevated will discuss addition of insulin.      Relevant Medications   enalapril (VASOTEC) 20 MG tablet   Other Relevant Orders   CMP14+EGFR   Microalbumin / creatinine urine ratio     Genitourinary   Stage 3b chronic kidney disease ([place])    Patient needs labs given prior abnormal labs.  Discussed possibility of starting [place].  Patient will discuss this with his nephrologist.  I will start if his labs are favorable to do so.      Relevant Orders   CBC     Other   Hyperlipidemia LDL goal <70    Continue Praluent.      Relevant Medications   enalapril (VASOTEC) 20 MG tablet   SOB (shortness of breath)    Etiology unclear.  Cardiology has recommended echocardiogram.  Will go ahead and order.      Relevant Orders   ECHOCARDIOGRAM COMPLETE   Other Visit Diagnoses     Serum calcium elevated       Relevant Orders   Parathyroid hormone, intact (no Ca)   Elevated alkaline phosphatase level       Relevant Orders   Gamma GT       Meds ordered this encounter  Medications   enalapril (VASOTEC) 20 MG tablet    Sig: Take 0.5 tablets (10 mg total) by mouth daily.    Dispense:  90 tablet    Refill:  1    Follow-up:  3 months  Brandon DO [place]

## 2022-03-02 NOTE — Assessment & Plan Note (Signed)
Continue Praluent. 

## 2022-03-04 LAB — CMP14+EGFR
ALT: 22 IU/L (ref 0–44)
AST: 21 IU/L (ref 0–40)
Albumin/Globulin Ratio: 1.7 (ref 1.2–2.2)
Albumin: 4.6 g/dL (ref 3.8–4.8)
Alkaline Phosphatase: 134 IU/L — ABNORMAL HIGH (ref 44–121)
BUN/Creatinine Ratio: 15 (ref 10–24)
BUN: 24 mg/dL (ref 8–27)
Bilirubin Total: 0.4 mg/dL (ref 0.0–1.2)
CO2: 24 mmol/L (ref 20–29)
Calcium: 10.1 mg/dL (ref 8.6–10.2)
Chloride: 97 mmol/L (ref 96–106)
Creatinine, Ser: 1.55 mg/dL — ABNORMAL HIGH (ref 0.76–1.27)
Globulin, Total: 2.7 g/dL (ref 1.5–4.5)
Glucose: 140 mg/dL — ABNORMAL HIGH (ref 70–99)
Potassium: 5.6 mmol/L — ABNORMAL HIGH (ref 3.5–5.2)
Sodium: 135 mmol/L (ref 134–144)
Total Protein: 7.3 g/dL (ref 6.0–8.5)
eGFR: 46 mL/min/{1.73_m2} — ABNORMAL LOW (ref >59)

## 2022-03-04 LAB — CBC
Hematocrit: 37.7 % (ref 37.5–51.0)
Hemoglobin: 13.3 g/dL (ref 13.0–17.7)
MCH: 31.6 pg (ref 26.6–33.0)
MCHC: 35.3 g/dL (ref 31.5–35.7)
MCV: 90 fL (ref 79–97)
Platelets: 346 10*3/uL (ref 150–450)
RBC: 4.21 x10E6/uL (ref 4.14–5.80)
RDW: 13.4 % (ref 11.6–15.4)
WBC: 10.5 10*3/uL (ref 3.4–10.8)

## 2022-03-04 LAB — MICROALBUMIN / CREATININE URINE RATIO
Creatinine, Urine: 122.6 mg/dL
Microalb/Creat Ratio: 232 mg/g creat — ABNORMAL HIGH (ref 0–29)
Microalbumin, Urine: 284.1 ug/mL

## 2022-03-04 LAB — PARATHYROID HORMONE, INTACT (NO CA): PTH: 24 pg/mL (ref 15–65)

## 2022-03-04 LAB — GAMMA GT: GGT: 38 IU/L (ref 0–65)

## 2022-03-06 NOTE — Addendum Note (Signed)
Addended by: Dairl Ponder on: 03/06/2022 10:47 AM   Modules accepted: Orders

## 2022-03-16 ENCOUNTER — Telehealth: Payer: Self-pay | Admitting: Family Medicine

## 2022-03-16 NOTE — Telephone Encounter (Signed)
Patient dropped off [facility] records for you to review , In your box

## 2022-03-16 NOTE — Telephone Encounter (Signed)
Please advise. Thank you

## 2022-03-21 NOTE — Progress Notes (Signed)
Assessment/Plan:    Tremor Patient's tremor is complex.  Tremor is not consistent with parkinsonian tremor and is more of an intention tremor.  However, he does have bradykinesia with decremation of rapid alternating movements on the right.  We tried to schedule the DaTscan and got an approval from insurance, but patient declined to do it once approved.  He wants to make sure it won't worsen kidney disease.  I sent a message to radiology to ask Start primidone 50 mg bid.  R/b/se discussed   2.  Hypoperfusion syndrome             -Patient had carotid endarterectomy October 30 and woke up with right arm paresis.  Symptoms lasted until the following day.  CT brain, CTA brain, MRI brain all negative.  Symptoms likely result of hypoperfusion during procedure with slow recovery following.  Patient currently at baseline.   Subjective:   Kyle Dunlap was seen today in follow up for Parkinsons disease.  My previous records were reviewed prior to todays visit as well as outside records available to me. Pt with wife who supplements hx.   Last visit, we decided to go ahead and proceed with DaTscan.  That was approved, but ultimately the patient declined the procedure.  He states that he was worried it would worsen stage 3 kidney disease.  No falls.  No diplopia.  Tremor is bothersome with eating.   Current prescribed movement disorder medications: none   PREVIOUS MEDICATIONS: none to date  ALLERGIES:   Allergies  Allergen Reactions   Atorvastatin Other (See Comments)    Joint and muscle aches, weakness present   Zetia [Ezetimibe] Other (See Comments)    Elevated liver enzymes   Zocor [Simvastatin] Other (See Comments)    Joint and muscle aches   Codeine Other (See Comments)    Passed out    CURRENT MEDICATIONS:  Current Meds  Medication Sig   acetaminophen (TYLENOL) 325 MG tablet Take 650 mg by mouth every 6 (six) hours as needed for moderate pain.   albuterol (VENTOLIN HFA) 108  (90 Base) MCG/ACT inhaler TAKE 2 PUFFS BY MOUTH EVERY 6 HOURS AS NEEDED FOR WHEEZE OR SHORTNESS OF BREATH   Alirocumab (PRALUENT) 150 MG/ML SOAJ INJECT 1 PEN INTO THE SKIN EVERY 14 (FOURTEEN) DAYS.   Ascorbic Acid (VITAMIN C) 1000 MG tablet Take 1,000 mg by mouth 3 (three) times a week.   b complex vitamins capsule Take 1 capsule by mouth once a week.   Cholecalciferol (VITAMIN D3) 125 MCG (5000 UT) CAPS Take 5,000 Units by mouth 2 (two) times a week.   clopidogrel (PLAVIX) 75 MG tablet TAKE 1 TABLET BY MOUTH EVERY DAY   diazepam (VALIUM) 2 MG tablet TAKE ONE TABLET ('2MG'$  TOTAL) BY MOUTH DAILY AS NEEDED FOR DIZZINESS SECONDARY TO MENIERE'S DISEASE   diclofenac Sodium (VOLTAREN) 1 % GEL Apply 2 g topically daily as needed (pain).   DULoxetine (CYMBALTA) 60 MG capsule Take 1 capsule (60 mg total) by mouth daily.   glipiZIDE (GLUCOTROL) 5 MG tablet Take 1 tablet (5 mg total) by mouth 2 (two) times daily before a meal.   hydrALAZINE (APRESOLINE) 25 MG tablet Take 50 mg by mouth 2 (two) times daily.   levothyroxine (SYNTHROID) 25 MCG tablet TAKE ONE TABLET BY MOUTH DAILY FOR HYPOTHYROIDISM   Multiple Vitamin (MULTIVITAMIN WITH MINERALS) TABS Take 1 tablet by mouth daily.   pantoprazole (PROTONIX) 40 MG tablet TAKE 1 TABLET BY MOUTH EVERY DAY  sodium chloride (OCEAN) 0.65 % SOLN nasal spray Place 1 spray into both nostrils at bedtime.   TIADYLT ER 360 MG 24 hr capsule TAKE 1 CAPSULE BY MOUTH EVERY DAY   traMADol (ULTRAM) 50 MG tablet Take 1 tablet (50 mg total) by mouth every 6 (six) hours as needed.     Objective:   PHYSICAL EXAMINATION:    VITALS:   Vitals:   03/26/22 0837  BP: (!) 161/69  Pulse: 66  Resp: 18  SpO2: 98%  Weight: 208 lb (94.3 kg)  Height: '5\' 9"'$  (1.753 m)    GEN:  The patient appears stated age and is in NAD. HEENT:  Normocephalic, atraumatic.  The mucous membranes are moist. The superficial temporal arteries are without ropiness or tenderness. CV:  RRR Lungs:   CTAB Neck/HEME:  There are no carotid bruits bilaterally.  Neurological examination:  Orientation: The patient is alert and oriented x3. Cranial nerves: There is good facial symmetry without significant facial hypomimia. The speech is fluent and clear. Soft palate rises symmetrically and there is no tongue deviation. Hearing is intact to conversational tone. Sensation: Sensation is intact to light touch throughout Motor: Strength is at least antigravity x4.  Movement examination: Tone: Patient has trouble relaxing to properly examine tone.  This is similar to last visit. Abnormal movements: No rest tremor, even with distraction procedures.  He does have mild intention tremor on the right. Its a bit worse in the wing beating pos'n with a weight Coordination:  There is  decremation with RAM's, with any form of RAMS, including alternating supination and pronation of the forearm, hand opening and closing, finger taps, heel taps and toe taps on the right.  He does well on the left. Gait and Station: The patient ambulates well out of the office  I have reviewed and interpreted the following labs independently    Chemistry      Component Value Date/Time   NA 135 03/02/2022 1434   K 5.6 (H) 03/02/2022 1434   CL 97 03/02/2022 1434   CO2 24 03/02/2022 1434   BUN 24 03/02/2022 1434   CREATININE 1.55 (H) 03/02/2022 1434   CREATININE 1.22 03/15/2014 1054      Component Value Date/Time   CALCIUM 10.1 03/02/2022 1434   ALKPHOS 134 (H) 03/02/2022 1434   AST 21 03/02/2022 1434   ALT 22 03/02/2022 1434   BILITOT 0.4 03/02/2022 1434       Lab Results  Component Value Date   WBC 10.5 03/02/2022   HGB 13.3 03/02/2022   HCT 37.7 03/02/2022   MCV 90 03/02/2022   PLT 346 03/02/2022    Lab Results  Component Value Date   TSH 2.060 08/11/2021     Cc:  Coral Spikes, DO

## 2022-03-26 ENCOUNTER — Ambulatory Visit: Payer: Medicare Other | Admitting: Neurology

## 2022-03-26 ENCOUNTER — Telehealth: Payer: Self-pay | Admitting: Neurology

## 2022-03-26 VITALS — BP 161/69 | HR 66 | Resp 18 | Ht 69.0 in | Wt 208.0 lb

## 2022-03-26 DIAGNOSIS — R251 Tremor, unspecified: Secondary | ICD-10-CM

## 2022-03-26 MED ORDER — PRIMIDONE 50 MG PO TABS: 50.0000 mg | ORAL_TABLET | Freq: Two times a day (BID) | ORAL | 1 refills | Status: DC

## 2022-03-26 NOTE — Telephone Encounter (Signed)
No answer at 335 03/26/2022

## 2022-03-26 NOTE — Patient Instructions (Addendum)
Start primidone 50 mg - 1/2 tablet at bedtime for 1 week and then increase to 1 tablet at bedtime x 1 week and then 1 tablet twice per day thereafter  I will check on the DaT scan regarding kidney function.  You can think about the skin biopsies we discussed  The physicians and staff at [facility] are committed to providing excellent care. You may receive a survey requesting feedback about your experience at our office. We strive to receive "very good" responses to the survey questions. If you feel that your experience would prevent you from giving the office a "very good " response, please contact our office to try to remedy the situation. We may be reached at [phone]. Thank you for taking the time out of your busy day to complete the survey.

## 2022-03-26 NOTE — Telephone Encounter (Signed)
I saw the patient today and he didn't want to do DaT scan because he thought it made kidney function worse.  I followed up with radiologist and got the following response:  There is no contraindication for renal insufficiency.  The Package insert states that the delayed renal clearance could alter images (more radiotracer available for longer) and more radiation to the body in general. But this is low dose of radiation (4 mCi).  No indication that would result in worsening renal failure ( it states not studied).  But again no contraindication and advisement to check renal function prior to administration.   We routinely give higher doses of other radiation without checking renal function.   Please have pt let us know if he would like to proceed with DaT scan or continue to hold off

## 2022-03-27 NOTE — Telephone Encounter (Signed)
Called and spoke to patients wife and they want to think about it and cal the office back about resubmitting the PA to do this procedure

## 2022-04-04 ENCOUNTER — Other Ambulatory Visit: Payer: Self-pay

## 2022-04-04 ENCOUNTER — Ambulatory Visit
Admission: RE | Admit: 2022-04-04 | Discharge: 2022-04-04 | Disposition: A | Discharge status: Home / Self Care | Payer: Medicare Other | Source: Ambulatory Visit | Attending: Family Medicine | Admitting: Family Medicine

## 2022-04-04 ENCOUNTER — Ambulatory Visit (INDEPENDENT_AMBULATORY_CARE_PROVIDER_SITE_OTHER): Payer: Medicare Other

## 2022-04-04 VITALS — BP 163/70 | HR 70 | Temp 98.4°F | Resp 20

## 2022-04-04 DIAGNOSIS — J209 Acute bronchitis, unspecified: Secondary | ICD-10-CM

## 2022-04-04 DIAGNOSIS — R0602 Shortness of breath: Secondary | ICD-10-CM | POA: Diagnosis not present

## 2022-04-04 DIAGNOSIS — R079 Chest pain, unspecified: Secondary | ICD-10-CM | POA: Diagnosis not present

## 2022-04-04 MED ORDER — DEXAMETHASONE SODIUM PHOSPHATE 10 MG/ML IJ SOLN
10.0000 mg | Freq: Once | INTRAMUSCULAR | Status: AC
  Administered 2022-04-04: 10 mg via INTRAMUSCULAR

## 2022-04-04 NOTE — ED Triage Notes (Signed)
Feels SOB with chest tightness since yesterday.

## 2022-04-04 NOTE — Discharge Instructions (Addendum)
We have given you a steroid shot today for suspected bronchitis to be causing your symptoms.  You may also take Coricidin HBP, plain Mucinex, Flonase to help with symptoms.  Follow-up for worsening symptoms.

## 2022-04-04 NOTE — ED Provider Notes (Signed)
[facility]    CSN: [id number] Arrival date & time: 04/04/22  1450      History   Chief Complaint Chief Complaint  Patient presents with   Cough    Chest feels tightOxygen was 90 last night.Neg covid at home testOxygen is 28 today - Entered by patient    HPI ZAIN LAWRY is a 77 y.o. male.   Feels SOB with chest tightness since yesterday.    Patient presenting today with several day history of mild cough, chest tightness, mild shortness of breath.  Denies associated fever, chills, body aches, sore throat, significant nasal congestion, chest pain, arm pain, diaphoresis, nausea, vomiting.  States wife has similar symptoms and was diagnosed with a COPD exacerbation this morning at another urgent care.  No diagnosed history of chronic pulmonary disease however does have seasonal allergies, history of pneumonia    Past Medical History:  Diagnosis Date   Adenomatous colon polyp 11/22/2008   Dr. Gala Romney   Allergy    Carotid artery stenosis    Chronic back pain    CKD (chronic kidney disease)    DDD (degenerative disc disease), lumbar    GERD (gastroesophageal reflux disease)    Heart murmur 2023   Hemorrhoids, external    High cholesterol    History of kidney stones 2013   HTN (hypertension)    Hyperplastic colon polyp 11/22/2008   Dr. Gala Romney   Hypothyroidism    Kidney stone    "14 years ago" per pt   Meniere's disease    Meniere's disease    Pneumonia 1965   PTSD (post-traumatic stress disorder)    Tachyarrhythmia    Type 2 diabetes mellitus ([place])     Patient Active Problem List   Diagnosis Date Noted   SOB (shortness of breath) 03/02/2022   Cogwheel rigidity 12/04/2021   Carotid artery stenosis 11/20/2021   Generalized anxiety disorder 10/26/2021   Hypothyroidism 10/26/2021   Type 2 diabetes mellitus with diabetic nephropathy, without long-term current use of insulin ([place]) 11/15/2020   Stage 3b chronic kidney disease ([place]) 11/15/2020   Chronic  right-sided low back pain without sciatica 11/15/2020   Diabetic neuropathy ([place]) 11/15/2020   Statin myopathy 05/09/2020   Aortic atherosclerosis ([place]) 05/06/2020   Meniere's disease 03/20/2015   Hyperlipidemia LDL goal <70 07/15/2012   Essential hypertension, benign 07/15/2012   GERD (gastroesophageal reflux disease) 08/03/2011    Past Surgical History:  Procedure Laterality Date   CATARACT EXTRACTION W/PHACO Left 09/28/2016   Procedure: CATARACT EXTRACTION PHACO AND INTRAOCULAR LENS PLACEMENT ([place]);  Surgeon: Baruch Goldmann, MD;  Location: [facility];  Service: Ophthalmology;  Laterality: Left;  CDE: 4.40   CATARACT EXTRACTION W/PHACO Right 10/26/2016   Procedure: CATARACT EXTRACTION PHACO AND INTRAOCULAR LENS PLACEMENT RIGHT EYE;  Surgeon: Baruch Goldmann, MD;  Location: [facility];  Service: Ophthalmology;  Laterality: Right;  CDE: 6.74   CHOLECYSTECTOMY     CHOLECYSTECTOMY     COLONOSCOPY  12/10/2008   Dr. Gala Romney- L side diverticula, adenomatous polyp, hyperplastic polyp   COLONOSCOPY  11/16/2003   Dr. Laural Golden- performed exam to cecum,3 polyps- no path report available, external hemorrhoids,    COLONOSCOPY N/A 01/04/2014   Procedure: COLONOSCOPY;  Surgeon: Daneil Dolin, MD;  Location: [facility];  Service: Endoscopy;  Laterality: N/A;  8:30am   COLONOSCOPY N/A 02/20/2017   Surgeon: Daneil Dolin, MD; multiple colon polyps ranging 3 to 9 mm in size.  Pathology with tubular adenomas.  Recommended repeat in  3 years.   COLONOSCOPY WITH PROPOFOL N/A 10/03/2020   Procedure: COLONOSCOPY WITH PROPOFOL;  Surgeon: Daneil Dolin, MD;  Location: [facility];  Service: Endoscopy;  Laterality: N/A;  8:30am   ENDARTERECTOMY Left 11/20/2021   Procedure: LEFT CAROTID ENDARTERECTOMY;  Surgeon: Broadus John, MD;  Location: [facility];  Service: Vascular;  Laterality: Left;   ESOPHAGOGASTRODUODENOSCOPY  08/31/2011   [id number] esophageal erosions consistent with mild erosive reflux esophagitis    MOUTH SURGERY     POLYPECTOMY  10/03/2020   Procedure: POLYPECTOMY;  Surgeon: Daneil Dolin, MD;  Location: [facility];  Service: Endoscopy;;       Home Medications    Prior to Admission medications   Medication Sig Start Date End Date Taking? Authorizing Provider  acetaminophen (TYLENOL) 325 MG tablet Take 650 mg by mouth every 6 (six) hours as needed for moderate pain.    [provider]  albuterol (VENTOLIN HFA) 108 (90 Base) MCG/ACT inhaler TAKE 2 PUFFS BY MOUTH EVERY 6 HOURS AS NEEDED FOR WHEEZE OR SHORTNESS OF BREATH 11/02/21   Cook, Jayce G, DO  Alirocumab (PRALUENT) 150 MG/ML SOAJ INJECT 1 PEN INTO THE SKIN EVERY 14 (FOURTEEN) DAYS. 01/09/22   Josue Hector, MD  Ascorbic Acid (VITAMIN C) 1000 MG tablet Take 1,000 mg by mouth 3 (three) times a week.    [provider]  b complex vitamins capsule Take 1 capsule by mouth once a week.    [provider]  Cholecalciferol (VITAMIN D3) 125 MCG (5000 UT) CAPS Take 5,000 Units by mouth 2 (two) times a week.    [provider]  clopidogrel (PLAVIX) 75 MG tablet TAKE 1 TABLET BY MOUTH EVERY DAY 02/16/22   Waynetta Sandy, MD  diazepam (VALIUM) 2 MG tablet TAKE ONE TABLET ('2MG'$  TOTAL) BY MOUTH DAILY AS NEEDED FOR DIZZINESS SECONDARY TO MENIERE'S DISEASE 02/13/22   Coral Spikes, DO  diclofenac Sodium (VOLTAREN) 1 % GEL Apply 2 g topically daily as needed (pain). 09/29/21   [provider]  DULoxetine (CYMBALTA) 60 MG capsule Take 1 capsule (60 mg total) by mouth daily. 05/11/21   Coral Spikes, DO  glipiZIDE (GLUCOTROL) 5 MG tablet Take 1 tablet (5 mg total) by mouth 2 (two) times daily before a meal. 01/10/22   Brita Romp, NP  hydrALAZINE (APRESOLINE) 25 MG tablet Take 50 mg by mouth 3 (three) times daily. 10/17/21   [provider]  levothyroxine (SYNTHROID) 25 MCG tablet TAKE ONE TABLET BY MOUTH DAILY FOR HYPOTHYROIDISM 04/12/21   [provider]  Multiple Vitamin  (MULTIVITAMIN WITH MINERALS) TABS Take 1 tablet by mouth daily.    [provider]  pantoprazole (PROTONIX) 40 MG tablet TAKE 1 TABLET BY MOUTH EVERY DAY 12/30/20   Coral Spikes, DO  primidone (MYSOLINE) 50 MG tablet Take 1 tablet (50 mg total) by mouth 2 (two) times daily. 03/26/22   Tat, Eustace Quail, DO  sodium chloride (OCEAN) 0.65 % SOLN nasal spray Place 1 spray into both nostrils at bedtime.    [provider]  TIADYLT ER 360 MG 24 hr capsule TAKE 1 CAPSULE BY MOUTH EVERY DAY 01/02/21   Coral Spikes, DO  traMADol (ULTRAM) 50 MG tablet Take 1 tablet (50 mg total) by mouth every 6 (six) hours as needed. 11/21/21 11/21/22  Dagoberto Ligas, PA-C    Family History Family History  Problem Relation Age of Onset   Lung cancer Mother  Heart attack Father    Colon cancer Brother 59   Alzheimer's disease Brother    Alzheimer's disease Brother    Alzheimer's disease Brother    Ankylosing spondylitis Child    Thyroid nodules Child     Social History Social History   Tobacco Use   Smoking status: Former    Packs/day: 2.00    Years: 24.00    Total pack years: 48.00    Types: Cigarettes    Quit date: 1985    Years since quitting: 39.2   Smokeless tobacco: Never  Vaping Use   Vaping Use: Never used  Substance Use Topics   Alcohol use: Yes    Comment: occasionally- 4-5 mixed drinks per week.   Drug use: No     Allergies   Atorvastatin, Zetia [ezetimibe], Zocor [simvastatin], and Codeine   Review of Systems Review of Systems PER HPI  Physical Exam Triage Vital Signs ED Triage Vitals  Enc Vitals Group     BP 04/04/22 1453 (!) 163/70     Pulse Rate 04/04/22 1453 70     Resp 04/04/22 1453 20     Temp 04/04/22 1453 98.4 F (36.9 C)     Temp Source 04/04/22 1453 Oral     SpO2 04/04/22 1453 94 %     Weight --      Height --      Head Circumference --      Peak Flow --      Pain Score 04/04/22 1455 0     Pain Loc --      Pain Edu? --      Excl. in [place]?  --    No data found.  Updated Vital Signs BP (!) 163/70 (BP Location: Right Arm)   Pulse 70   Temp 98.4 F (36.9 C) (Oral)   Resp 20   SpO2 94%   Visual Acuity Right Eye Distance:   Left Eye Distance:   Bilateral Distance:    Right Eye Near:   Left Eye Near:    Bilateral Near:     Physical Exam Vitals and nursing note reviewed.  Constitutional:      Appearance: He is well-developed.  HENT:     Head: Atraumatic.     Right Ear: External ear normal.     Left Ear: External ear normal.     Nose: Nose normal.     Mouth/Throat:     Mouth: Mucous membranes are moist.     Pharynx: Oropharynx is clear. No oropharyngeal exudate.  Eyes:     Conjunctiva/sclera: Conjunctivae normal.     Pupils: Pupils are equal, round, and reactive to light.  Cardiovascular:     Rate and Rhythm: Normal rate and regular rhythm.  Pulmonary:     Effort: Pulmonary effort is normal. No respiratory distress.     Breath sounds: No wheezing or rales.  Musculoskeletal:        General: Normal range of motion.     Cervical back: Normal range of motion and neck supple.  Lymphadenopathy:     Cervical: No cervical adenopathy.  Skin:    General: Skin is warm and dry.  Neurological:     Mental Status: He is alert and oriented to person, place, and time.     Motor: No weakness.     Gait: Gait normal.  Psychiatric:        Behavior: Behavior normal.    UC Treatments / Results  Labs (all labs ordered are listed,  but only abnormal results are displayed) Labs Reviewed - No data to display  EKG  Radiology DG Chest 2 View  Result Date: 04/04/2022 CLINICAL DATA:  Chest tightness with shortness of breath for 3 days. EXAM: CHEST - 2 VIEW COMPARISON:  Cardiac CT 04/01/2020.  Chest radiographs 03/26/2010. FINDINGS: The heart size and mediastinal contours are stable. There is aortic atherosclerosis. Compared with the prior study, there is increased central airway and fissural thickening. No edema, confluent  airspace opacity, pleural effusion or pneumothorax. There are stable mild degenerative changes in the spine. Cholecystectomy clips are noted. IMPRESSION: Increased central airway and fissural thickening compared with prior study, suggesting bronchitis. No evidence of pneumonia or edema. Follow-up as clinically warranted. Electronically Signed   By: Richardean Sale M.D.   On: 04/04/2022 15:41    Procedures Procedures (including critical care time)  Medications Ordered in UC Medications  dexamethasone (DECADRON) injection 10 mg (10 mg Intramuscular Given 04/04/22 1605)   Initial Impression / Assessment and Plan / UC Course  I have reviewed the triage vital signs and the nursing notes.  Pertinent labs & imaging results that were available during my care of the patient were reviewed by me and considered in my medical decision making (see chart for details).     Minimally hypertensive today, otherwise vital signs reassuring and patient appears in no acute distress today.  His EKG today is showing normal sinus rhythm at 70 bpm with no acute ST or T wave changes.  Chest x-ray today showing bronchitis type changes, no pneumonia or other abnormalities noted.  Per wife he just refilled his albuterol inhaler so has access to that and will treat with IM Decadron, discussed Coricidin HBP and other blood pressure safe remedies for symptoms additionally.  Declines viral testing today.  Return for worsening symptoms.  Final Clinical Impressions(s) / UC Diagnoses   Final diagnoses:  Acute bronchitis, unspecified organism     Discharge Instructions      We have given you a steroid shot today for suspected bronchitis to be causing your symptoms.  You may also take Coricidin HBP, plain Mucinex, Flonase to help with symptoms.  Follow-up for worsening symptoms.    ED Prescriptions   None    PDMP not reviewed this encounter.   Volney American, [place] 04/04/22 1709

## 2022-04-08 ENCOUNTER — Other Ambulatory Visit: Payer: Self-pay

## 2022-04-08 ENCOUNTER — Encounter (HOSPITAL_COMMUNITY): Payer: Self-pay | Admitting: *Deleted

## 2022-04-08 ENCOUNTER — Emergency Department (HOSPITAL_COMMUNITY): Payer: Medicare Other

## 2022-04-08 ENCOUNTER — Inpatient Hospital Stay (HOSPITAL_COMMUNITY)
Admission: EM | Admit: 2022-04-08 | Discharge: 2022-04-13 | DRG: 286 | Disposition: A | Discharge status: Home / Self Care | Payer: Medicare Other | Attending: Cardiology | Admitting: Cardiology

## 2022-04-08 DIAGNOSIS — I471 Supraventricular tachycardia, unspecified: Principal | ICD-10-CM

## 2022-04-08 DIAGNOSIS — Z9841 Cataract extraction status, right eye: Secondary | ICD-10-CM

## 2022-04-08 DIAGNOSIS — M549 Dorsalgia, unspecified: Secondary | ICD-10-CM | POA: Diagnosis present

## 2022-04-08 DIAGNOSIS — H8103 Meniere's disease, bilateral: Secondary | ICD-10-CM

## 2022-04-08 DIAGNOSIS — Z7901 Long term (current) use of anticoagulants: Secondary | ICD-10-CM

## 2022-04-08 DIAGNOSIS — G8929 Other chronic pain: Secondary | ICD-10-CM | POA: Diagnosis present

## 2022-04-08 DIAGNOSIS — E1151 Type 2 diabetes mellitus with diabetic peripheral angiopathy without gangrene: Secondary | ICD-10-CM | POA: Diagnosis present

## 2022-04-08 DIAGNOSIS — I10 Essential (primary) hypertension: Secondary | ICD-10-CM | POA: Diagnosis present

## 2022-04-08 DIAGNOSIS — E7800 Pure hypercholesterolemia, unspecified: Secondary | ICD-10-CM | POA: Diagnosis present

## 2022-04-08 DIAGNOSIS — E785 Hyperlipidemia, unspecified: Secondary | ICD-10-CM | POA: Diagnosis present

## 2022-04-08 DIAGNOSIS — G252 Other specified forms of tremor: Secondary | ICD-10-CM | POA: Insufficient documentation

## 2022-04-08 DIAGNOSIS — E1165 Type 2 diabetes mellitus with hyperglycemia: Secondary | ICD-10-CM | POA: Diagnosis present

## 2022-04-08 DIAGNOSIS — N1831 Chronic kidney disease, stage 3a: Secondary | ICD-10-CM | POA: Diagnosis present

## 2022-04-08 DIAGNOSIS — E1121 Type 2 diabetes mellitus with diabetic nephropathy: Secondary | ICD-10-CM | POA: Diagnosis present

## 2022-04-08 DIAGNOSIS — E0590 Thyrotoxicosis, unspecified without thyrotoxic crisis or storm: Secondary | ICD-10-CM | POA: Diagnosis present

## 2022-04-08 DIAGNOSIS — Z885 Allergy status to narcotic agent status: Secondary | ICD-10-CM

## 2022-04-08 DIAGNOSIS — D72829 Elevated white blood cell count, unspecified: Secondary | ICD-10-CM | POA: Diagnosis not present

## 2022-04-08 DIAGNOSIS — E1122 Type 2 diabetes mellitus with diabetic chronic kidney disease: Secondary | ICD-10-CM | POA: Diagnosis present

## 2022-04-08 DIAGNOSIS — Z8719 Personal history of other diseases of the digestive system: Secondary | ICD-10-CM

## 2022-04-08 DIAGNOSIS — E871 Hypo-osmolality and hyponatremia: Secondary | ICD-10-CM | POA: Diagnosis present

## 2022-04-08 DIAGNOSIS — H8109 Meniere's disease, unspecified ear: Secondary | ICD-10-CM | POA: Diagnosis present

## 2022-04-08 DIAGNOSIS — Z9842 Cataract extraction status, left eye: Secondary | ICD-10-CM

## 2022-04-08 DIAGNOSIS — E1140 Type 2 diabetes mellitus with diabetic neuropathy, unspecified: Secondary | ICD-10-CM | POA: Diagnosis present

## 2022-04-08 DIAGNOSIS — Z79899 Other long term (current) drug therapy: Secondary | ICD-10-CM

## 2022-04-08 DIAGNOSIS — G720 Drug-induced myopathy: Secondary | ICD-10-CM | POA: Diagnosis present

## 2022-04-08 DIAGNOSIS — K219 Gastro-esophageal reflux disease without esophagitis: Secondary | ICD-10-CM | POA: Diagnosis not present

## 2022-04-08 DIAGNOSIS — F32A Depression, unspecified: Secondary | ICD-10-CM | POA: Diagnosis present

## 2022-04-08 DIAGNOSIS — R0602 Shortness of breath: Secondary | ICD-10-CM | POA: Diagnosis present

## 2022-04-08 DIAGNOSIS — I129 Hypertensive chronic kidney disease with stage 1 through stage 4 chronic kidney disease, or unspecified chronic kidney disease: Secondary | ICD-10-CM | POA: Diagnosis present

## 2022-04-08 DIAGNOSIS — J811 Chronic pulmonary edema: Secondary | ICD-10-CM | POA: Diagnosis present

## 2022-04-08 DIAGNOSIS — J9601 Acute respiratory failure with hypoxia: Secondary | ICD-10-CM | POA: Diagnosis not present

## 2022-04-08 DIAGNOSIS — Z8249 Family history of ischemic heart disease and other diseases of the circulatory system: Secondary | ICD-10-CM

## 2022-04-08 DIAGNOSIS — I700 Atherosclerosis of aorta: Secondary | ICD-10-CM | POA: Diagnosis present

## 2022-04-08 DIAGNOSIS — Z87891 Personal history of nicotine dependence: Secondary | ICD-10-CM

## 2022-04-08 DIAGNOSIS — Z961 Presence of intraocular lens: Secondary | ICD-10-CM | POA: Diagnosis present

## 2022-04-08 DIAGNOSIS — I4719 Other supraventricular tachycardia: Secondary | ICD-10-CM | POA: Diagnosis not present

## 2022-04-08 DIAGNOSIS — R29898 Other symptoms and signs involving the musculoskeletal system: Secondary | ICD-10-CM | POA: Diagnosis present

## 2022-04-08 DIAGNOSIS — Z820 Family history of epilepsy and other diseases of the nervous system: Secondary | ICD-10-CM

## 2022-04-08 DIAGNOSIS — I739 Peripheral vascular disease, unspecified: Secondary | ICD-10-CM | POA: Diagnosis present

## 2022-04-08 DIAGNOSIS — I200 Unstable angina: Secondary | ICD-10-CM

## 2022-04-08 DIAGNOSIS — N1832 Chronic kidney disease, stage 3b: Secondary | ICD-10-CM | POA: Diagnosis present

## 2022-04-08 DIAGNOSIS — I25110 Atherosclerotic heart disease of native coronary artery with unstable angina pectoris: Secondary | ICD-10-CM | POA: Diagnosis present

## 2022-04-08 DIAGNOSIS — Z888 Allergy status to other drugs, medicaments and biological substances status: Secondary | ICD-10-CM

## 2022-04-08 DIAGNOSIS — Z79890 Hormone replacement therapy (postmenopausal): Secondary | ICD-10-CM

## 2022-04-08 DIAGNOSIS — F4310 Post-traumatic stress disorder, unspecified: Secondary | ICD-10-CM | POA: Diagnosis present

## 2022-04-08 DIAGNOSIS — F411 Generalized anxiety disorder: Secondary | ICD-10-CM | POA: Diagnosis present

## 2022-04-08 DIAGNOSIS — Z7984 Long term (current) use of oral hypoglycemic drugs: Secondary | ICD-10-CM

## 2022-04-08 DIAGNOSIS — E039 Hypothyroidism, unspecified: Secondary | ICD-10-CM | POA: Diagnosis present

## 2022-04-08 DIAGNOSIS — I6529 Occlusion and stenosis of unspecified carotid artery: Secondary | ICD-10-CM | POA: Diagnosis present

## 2022-04-08 DIAGNOSIS — R0789 Other chest pain: Secondary | ICD-10-CM | POA: Diagnosis present

## 2022-04-08 DIAGNOSIS — Z794 Long term (current) use of insulin: Secondary | ICD-10-CM

## 2022-04-08 DIAGNOSIS — Z8673 Personal history of transient ischemic attack (TIA), and cerebral infarction without residual deficits: Secondary | ICD-10-CM

## 2022-04-08 DIAGNOSIS — Z7902 Long term (current) use of antithrombotics/antiplatelets: Secondary | ICD-10-CM

## 2022-04-08 DIAGNOSIS — D649 Anemia, unspecified: Secondary | ICD-10-CM | POA: Insufficient documentation

## 2022-04-08 LAB — BASIC METABOLIC PANEL
Anion gap: 11 (ref 5–15)
BUN: 26 mg/dL — ABNORMAL HIGH (ref 8–23)
CO2: 24 mmol/L (ref 22–32)
Calcium: 8.7 mg/dL — ABNORMAL LOW (ref 8.9–10.3)
Chloride: 96 mmol/L — ABNORMAL LOW (ref 98–111)
Creatinine, Ser: 1.37 mg/dL — ABNORMAL HIGH (ref 0.61–1.24)
GFR, Estimated: 53 mL/min — ABNORMAL LOW (ref >60)
Glucose, Bld: 161 mg/dL — ABNORMAL HIGH (ref 70–99)
Potassium: 4.1 mmol/L (ref 3.5–5.1)
Sodium: 131 mmol/L — ABNORMAL LOW (ref 135–145)

## 2022-04-08 LAB — GLUCOSE, CAPILLARY
Glucose-Capillary: 111 mg/dL — ABNORMAL HIGH (ref 70–99)
Glucose-Capillary: 205 mg/dL — ABNORMAL HIGH (ref 70–99)

## 2022-04-08 LAB — VITAMIN D 25 HYDROXY (VIT D DEFICIENCY, FRACTURES): Vit D, 25-Hydroxy: 58.66 ng/mL (ref 30–100)

## 2022-04-08 LAB — CBC WITH DIFFERENTIAL/PLATELET
Abs Immature Granulocytes: 0.03 10*3/uL (ref 0.00–0.07)
Basophils Absolute: 0.1 10*3/uL (ref 0.0–0.1)
Basophils Relative: 0 %
Eosinophils Absolute: 0.2 10*3/uL (ref 0.0–0.5)
Eosinophils Relative: 1 %
HCT: 37.2 % — ABNORMAL LOW (ref 39.0–52.0)
Hemoglobin: 12.6 g/dL — ABNORMAL LOW (ref 13.0–17.0)
Immature Granulocytes: 0 %
Lymphocytes Relative: 19 %
Lymphs Abs: 2.4 10*3/uL (ref 0.7–4.0)
MCH: 30.5 pg (ref 26.0–34.0)
MCHC: 33.9 g/dL (ref 30.0–36.0)
MCV: 90.1 fL (ref 80.0–100.0)
Monocytes Absolute: 2 10*3/uL — ABNORMAL HIGH (ref 0.1–1.0)
Monocytes Relative: 16 %
Neutro Abs: 7.9 10*3/uL — ABNORMAL HIGH (ref 1.7–7.7)
Neutrophils Relative %: 64 %
Platelets: 322 10*3/uL (ref 150–400)
RBC: 4.13 MIL/uL — ABNORMAL LOW (ref 4.22–5.81)
RDW: 13.2 % (ref 11.5–15.5)
WBC: 12.4 10*3/uL — ABNORMAL HIGH (ref 4.0–10.5)
nRBC: 0 % (ref 0.0–0.2)

## 2022-04-08 LAB — MAGNESIUM: Magnesium: 1.8 mg/dL (ref 1.7–2.4)

## 2022-04-08 LAB — TROPONIN I (HIGH SENSITIVITY)
Troponin I (High Sensitivity): 34 ng/L — ABNORMAL HIGH (ref <18)
Troponin I (High Sensitivity): 37 ng/L — ABNORMAL HIGH (ref <18)

## 2022-04-08 LAB — TSH: TSH: 5.147 u[IU]/mL — ABNORMAL HIGH (ref 0.350–4.500)

## 2022-04-08 LAB — MRSA NEXT GEN BY PCR, NASAL: MRSA by PCR Next Gen: NOT DETECTED

## 2022-04-08 MED ORDER — SALINE SPRAY 0.65 % NA SOLN
1.0000 | Freq: Every day | NASAL | Status: DC
  Administered 2022-04-08 – 2022-04-12 (×5): 1 via NASAL
  Filled 2022-04-08 (×2): qty 44

## 2022-04-08 MED ORDER — HYDRALAZINE HCL 20 MG/ML IJ SOLN
5.0000 mg | INTRAMUSCULAR | Status: AC
  Filled 2022-04-08: qty 1

## 2022-04-08 MED ORDER — PRIMIDONE 50 MG PO TABS
50.0000 mg | ORAL_TABLET | Freq: Two times a day (BID) | ORAL | Status: DC
  Administered 2022-04-08 – 2022-04-10 (×4): 50 mg via ORAL
  Filled 2022-04-08 (×9): qty 1

## 2022-04-08 MED ORDER — DILTIAZEM HCL-DEXTROSE 125-5 MG/125ML-% IV SOLN (PREMIX)
5.0000 mg/h | INTRAVENOUS | Status: DC
  Administered 2022-04-08 – 2022-04-09 (×2): 5 mg/h via INTRAVENOUS
  Filled 2022-04-08 (×4): qty 125

## 2022-04-08 MED ORDER — ONDANSETRON HCL 4 MG PO TABS: 4.0000 mg | ORAL_TABLET | Freq: Four times a day (QID) | ORAL | Status: DC | PRN

## 2022-04-08 MED ORDER — ONDANSETRON HCL 4 MG/2ML IJ SOLN: 4.0000 mg | Freq: Four times a day (QID) | INTRAMUSCULAR | Status: DC | PRN

## 2022-04-08 MED ORDER — INSULIN ASPART 100 UNIT/ML IJ SOLN
0.0000 [IU] | Freq: Every day | INTRAMUSCULAR | Status: DC
  Administered 2022-04-08: 2 [IU] via SUBCUTANEOUS

## 2022-04-08 MED ORDER — DULOXETINE HCL 60 MG PO CPEP
60.0000 mg | ORAL_CAPSULE | Freq: Every day | ORAL | Status: DC
  Administered 2022-04-08 – 2022-04-13 (×6): 60 mg via ORAL
  Filled 2022-04-08 (×6): qty 1

## 2022-04-08 MED ORDER — CLOPIDOGREL BISULFATE 75 MG PO TABS
75.0000 mg | ORAL_TABLET | Freq: Every day | ORAL | Status: DC
  Administered 2022-04-08 – 2022-04-10 (×3): 75 mg via ORAL
  Filled 2022-04-08 (×3): qty 1

## 2022-04-08 MED ORDER — DILTIAZEM LOAD VIA INFUSION
10.0000 mg | Freq: Once | INTRAVENOUS | Status: AC
  Administered 2022-04-08: 10 mg via INTRAVENOUS
  Filled 2022-04-08: qty 10

## 2022-04-08 MED ORDER — FENTANYL CITRATE PF 50 MCG/ML IJ SOSY: 12.5000 ug | PREFILLED_SYRINGE | INTRAMUSCULAR | Status: DC | PRN

## 2022-04-08 MED ORDER — BISACODYL 5 MG PO TBEC: 5.0000 mg | DELAYED_RELEASE_TABLET | Freq: Every day | ORAL | Status: DC | PRN

## 2022-04-08 MED ORDER — HEPARIN SODIUM (PORCINE) 5000 UNIT/ML IJ SOLN
5000.0000 [IU] | Freq: Three times a day (TID) | INTRAMUSCULAR | Status: DC
  Administered 2022-04-08 – 2022-04-09 (×2): 5000 [IU] via SUBCUTANEOUS
  Filled 2022-04-08 (×2): qty 1

## 2022-04-08 MED ORDER — ADENOSINE 6 MG/2ML IV SOLN
INTRAVENOUS | Status: AC
  Administered 2022-04-08: 6 mg via INTRAVENOUS
  Filled 2022-04-08: qty 6

## 2022-04-08 MED ORDER — OXYCODONE HCL 5 MG PO TABS: 5.0000 mg | ORAL_TABLET | Freq: Four times a day (QID) | ORAL | Status: DC | PRN

## 2022-04-08 MED ORDER — INSULIN GLARGINE-YFGN 100 UNIT/ML ~~LOC~~ SOLN
5.0000 [IU] | Freq: Every day | SUBCUTANEOUS | Status: DC
  Administered 2022-04-08 – 2022-04-12 (×5): 5 [IU] via SUBCUTANEOUS
  Filled 2022-04-08 (×7): qty 0.05

## 2022-04-08 MED ORDER — ACETAMINOPHEN 650 MG RE SUPP: 650.0000 mg | Freq: Four times a day (QID) | RECTAL | Status: DC | PRN

## 2022-04-08 MED ORDER — ACETAMINOPHEN 325 MG PO TABS: 650.0000 mg | ORAL_TABLET | Freq: Four times a day (QID) | ORAL | Status: DC | PRN

## 2022-04-08 MED ORDER — LEVOTHYROXINE SODIUM 25 MCG PO TABS
25.0000 ug | ORAL_TABLET | Freq: Every day | ORAL | Status: DC
  Administered 2022-04-09 – 2022-04-13 (×5): 25 ug via ORAL
  Filled 2022-04-08 (×5): qty 1

## 2022-04-08 MED ORDER — INSULIN ASPART 100 UNIT/ML IJ SOLN
0.0000 [IU] | Freq: Three times a day (TID) | INTRAMUSCULAR | Status: DC
  Administered 2022-04-09: 3 [IU] via SUBCUTANEOUS
  Administered 2022-04-09: 1 [IU] via SUBCUTANEOUS
  Administered 2022-04-10 (×2): 3 [IU] via SUBCUTANEOUS
  Administered 2022-04-11 (×2): 2 [IU] via SUBCUTANEOUS
  Administered 2022-04-12 (×2): 1 [IU] via SUBCUTANEOUS
  Administered 2022-04-12: 2 [IU] via SUBCUTANEOUS
  Administered 2022-04-13: 1 [IU] via SUBCUTANEOUS

## 2022-04-08 MED ORDER — PANTOPRAZOLE SODIUM 40 MG PO TBEC
40.0000 mg | DELAYED_RELEASE_TABLET | Freq: Every day | ORAL | Status: DC
  Administered 2022-04-08 – 2022-04-12 (×5): 40 mg via ORAL
  Filled 2022-04-08 (×5): qty 1

## 2022-04-08 MED ORDER — INSULIN ASPART 100 UNIT/ML IJ SOLN
3.0000 [IU] | Freq: Three times a day (TID) | INTRAMUSCULAR | Status: DC
  Administered 2022-04-09 – 2022-04-12 (×9): 3 [IU] via SUBCUTANEOUS

## 2022-04-08 MED ORDER — ADENOSINE 6 MG/2ML IV SOLN: 6.0000 mg | Freq: Once | INTRAVENOUS | Status: AC

## 2022-04-08 MED ORDER — CHLORHEXIDINE GLUCONATE CLOTH 2 % EX PADS
6.0000 | MEDICATED_PAD | Freq: Every day | CUTANEOUS | Status: DC
  Administered 2022-04-09 – 2022-04-10 (×2): 6 via TOPICAL

## 2022-04-08 MED ORDER — HYDRALAZINE HCL 20 MG/ML IJ SOLN
INTRAMUSCULAR | Status: AC
  Administered 2022-04-08: 20 mg
  Filled 2022-04-08: qty 1

## 2022-04-08 MED ORDER — TRAZODONE HCL 50 MG PO TABS: 25.0000 mg | ORAL_TABLET | Freq: Every evening | ORAL | Status: DC | PRN

## 2022-04-08 MED ORDER — SODIUM CHLORIDE 0.9 % IV SOLN: INTRAVENOUS | Status: DC

## 2022-04-08 MED ORDER — HYDRALAZINE HCL 20 MG/ML IJ SOLN
5.0000 mg | INTRAMUSCULAR | Status: DC | PRN
  Administered 2022-04-09 – 2022-04-10 (×3): 5 mg via INTRAVENOUS
  Filled 2022-04-08 (×3): qty 1

## 2022-04-08 MED ORDER — DIAZEPAM 2 MG PO TABS: 2.0000 mg | ORAL_TABLET | Freq: Two times a day (BID) | ORAL | Status: DC | PRN

## 2022-04-08 MED ORDER — MAGNESIUM SULFATE 4 GM/100ML IV SOLN
4.0000 g | Freq: Once | INTRAVENOUS | Status: AC
  Administered 2022-04-08: 4 g via INTRAVENOUS
  Filled 2022-04-08: qty 100

## 2022-04-08 NOTE — ED Provider Notes (Signed)
[facility] Provider Note   CSN: [id number] Arrival date & time: 04/08/22  1031     History  Chief Complaint  Patient presents with   Chest Pain    Kyle Dunlap is a 77 y.o. male.  Patient presenting with rapid heart rate.  He said it started yesterday.  He had some chest discomfort felt short of breath.  Heart was racing.  He states has had this happen before and it was a rapid SVT.  Says he is on diltiazem for that.  Said he is on a blood thinner but he is really on Plavix.  Chart review was not able to see any evidence of history of rapid heart rate.  But arrived here with his heart in the 160s.  Occasionally it would kind and naturally block and you could see P waves so we figured it probably was an SVT.  Plan was to give him adenosine.  [facility] also followed by [facility].  And also followed by cardiology.  Past medical history sniffing for hypertension chronic back pain diabetes Mnire's disease posttraumatic stress disorder gastroesophageal reflux disease hypothyroidism chronic kidney disease carotid artery stenosis surgery has had his gallbladder removed and had an endarterectomy in October 23 on the left side.  Patient is a former smoker quit 1985.       Home Medications Prior to Admission medications   Medication Sig Start Date End Date Taking? Authorizing Provider  acetaminophen (TYLENOL) 325 MG tablet Take 650 mg by mouth every 6 (six) hours as needed for moderate pain.    [provider]  albuterol (VENTOLIN HFA) 108 (90 Base) MCG/ACT inhaler TAKE 2 PUFFS BY MOUTH EVERY 6 HOURS AS NEEDED FOR WHEEZE OR SHORTNESS OF BREATH 11/02/21   Cook, Jayce G, DO  Alirocumab (PRALUENT) 150 MG/ML SOAJ INJECT 1 PEN INTO THE SKIN EVERY 14 (FOURTEEN) DAYS. 01/09/22   Josue Hector, MD  Ascorbic Acid (VITAMIN C) 1000 MG tablet Take 1,000 mg by mouth 3 (three) times a week.     [provider]  b complex vitamins capsule Take 1 capsule by mouth once a week.    [provider]  Cholecalciferol (VITAMIN D3) 125 MCG (5000 UT) CAPS Take 5,000 Units by mouth 2 (two) times a week.    [provider]  clopidogrel (PLAVIX) 75 MG tablet TAKE 1 TABLET BY MOUTH EVERY DAY 02/16/22   Waynetta Sandy, MD  diazepam (VALIUM) 2 MG tablet TAKE ONE TABLET (2MG  TOTAL) BY MOUTH DAILY AS NEEDED FOR DIZZINESS SECONDARY TO MENIERE'S DISEASE 02/13/22   Coral Spikes, DO  diclofenac Sodium (VOLTAREN) 1 % GEL Apply 2 g topically daily as needed (pain). 09/29/21   [provider]  DULoxetine (CYMBALTA) 60 MG capsule Take 1 capsule (60 mg total) by mouth daily. 05/11/21   Coral Spikes, DO  glipiZIDE (GLUCOTROL) 5 MG tablet Take 1 tablet (5 mg total) by mouth 2 (two) times daily before a meal. 01/10/22   Brita Romp, NP  hydrALAZINE (APRESOLINE) 25 MG tablet Take 50 mg by mouth 3 (three) times daily. 10/17/21   [provider]  levothyroxine (SYNTHROID) 25 MCG tablet TAKE ONE TABLET BY MOUTH DAILY FOR HYPOTHYROIDISM 04/12/21   [provider]  Multiple Vitamin (MULTIVITAMIN WITH MINERALS) TABS Take 1 tablet by mouth daily.    [provider]  pantoprazole (PROTONIX) 40 MG tablet TAKE 1 TABLET BY  MOUTH EVERY DAY 12/30/20   Coral Spikes, DO  primidone (MYSOLINE) 50 MG tablet Take 1 tablet (50 mg total) by mouth 2 (two) times daily. 03/26/22   Tat, Eustace Quail, DO  sodium chloride (OCEAN) 0.65 % SOLN nasal spray Place 1 spray into both nostrils at bedtime.    [provider]  TIADYLT ER 360 MG 24 hr capsule TAKE 1 CAPSULE BY MOUTH EVERY DAY 01/02/21   Coral Spikes, DO  traMADol (ULTRAM) 50 MG tablet Take 1 tablet (50 mg total) by mouth every 6 (six) hours as needed. 11/21/21 11/21/22  Dagoberto Ligas, PA-C      Allergies    Atorvastatin, Zetia [ezetimibe], Zocor [simvastatin], and Codeine    Review of Systems   Review of  Systems  Constitutional:  Negative for chills and fever.  HENT:  Negative for ear pain and sore throat.   Eyes:  Negative for pain and visual disturbance.  Respiratory:  Positive for shortness of breath. Negative for cough.   Cardiovascular:  Positive for chest pain and palpitations.  Gastrointestinal:  Negative for abdominal pain and vomiting.  Genitourinary:  Negative for dysuria and hematuria.  Musculoskeletal:  Negative for arthralgias and back pain.  Skin:  Negative for color change and rash.  Neurological:  Negative for seizures and syncope.  All other systems reviewed and are negative.   Physical Exam Updated Vital Signs BP 128/69   Pulse (!) 123   Temp 98.3 F (36.8 C) (Oral)   Resp 13   Ht 1.753 m (5\' 9" )   Wt 94 kg   SpO2 100%   BMI 30.60 kg/m  Physical Exam Vitals and nursing note reviewed.  Constitutional:      General: He is in acute distress.     Appearance: Normal appearance. He is well-developed.  HENT:     Head: Normocephalic and atraumatic.     Mouth/Throat:     Mouth: Mucous membranes are moist.  Eyes:     Extraocular Movements: Extraocular movements intact.     Conjunctiva/sclera: Conjunctivae normal.     Pupils: Pupils are equal, round, and reactive to light.  Cardiovascular:     Rate and Rhythm: Regular rhythm. Tachycardia present.     Heart sounds: No murmur heard. Pulmonary:     Effort: Pulmonary effort is normal. No respiratory distress.     Breath sounds: Normal breath sounds. No wheezing, rhonchi or rales.  Abdominal:     Palpations: Abdomen is soft.     Tenderness: There is no abdominal tenderness.  Musculoskeletal:        General: No swelling.     Cervical back: Normal range of motion and neck supple. No rigidity.     Right lower leg: No edema.     Left lower leg: No edema.  Skin:    General: Skin is warm and dry.     Capillary Refill: Capillary refill takes less than 2 seconds.  Neurological:     Mental Status: He is alert.   Psychiatric:        Mood and Affect: Mood normal.     ED Results / Procedures / Treatments   Labs (all labs ordered are listed, but only abnormal results are displayed) Labs Reviewed  CBC WITH DIFFERENTIAL/PLATELET - Abnormal; Notable for the following components:      Result Value   WBC 12.4 (*)    RBC 4.13 (*)    Hemoglobin 12.6 (*)    HCT 37.2 (*)  Neutro Abs 7.9 (*)    Monocytes Absolute 2.0 (*)    All other components within normal limits  BASIC METABOLIC PANEL - Abnormal; Notable for the following components:   Sodium 131 (*)    Chloride 96 (*)    Glucose, Bld 161 (*)    BUN 26 (*)    Creatinine, Ser 1.37 (*)    Calcium 8.7 (*)    GFR, Estimated 53 (*)    All other components within normal limits  TROPONIN I (HIGH SENSITIVITY) - Abnormal; Notable for the following components:   Troponin I (High Sensitivity) 34 (*)    All other components within normal limits  TROPONIN I (HIGH SENSITIVITY) - Abnormal; Notable for the following components:   Troponin I (High Sensitivity) 37 (*)    All other components within normal limits  TROPONIN I (HIGH SENSITIVITY)    EKG None  Radiology DG Chest Port 1 View  Result Date: 04/08/2022 CLINICAL DATA:  Rapid heart rate. Chest pain and shortness of breath. Dizziness and weakness. EXAM: PORTABLE CHEST 1 VIEW COMPARISON:  04/03/2012 FINDINGS: Heart size and mediastinal contours are stable. Aortic atherosclerosis. Lung volumes are low. Blunting of the left costophrenic angle noted, similar to previous exam. No signs of interstitial edema or airspace consolidation. IMPRESSION: 1. Low lung volumes. 2. Persistent blunting of the left costophrenic angle compatible with small effusion. Electronically Signed   By: Kerby Moors M.D.   On: 04/08/2022 11:41    Procedures Procedures    Medications Ordered in ED Medications  0.9 %  sodium chloride infusion ( Intravenous New Bag/Given 04/08/22 1104)  diltiazem (CARDIZEM) 1 mg/mL load via  infusion 10 mg (10 mg Intravenous Bolus from Bag 04/08/22 1125)    And  diltiazem (CARDIZEM) 125 mg in dextrose 5% 125 mL (1 mg/mL) infusion (5 mg/hr Intravenous New Bag/Given 04/08/22 1124)  adenosine (ADENOCARD) 6 MG/2ML injection 6 mg (6 mg Intravenous Given 04/08/22 1105)    ED Course/ Medical Decision Making/ A&P                             Medical Decision Making Amount and/or Complexity of Data Reviewed Labs: ordered. Radiology: ordered.  Risk Prescription drug management. Decision regarding hospitalization.   CRITICAL CARE Performed by: Fredia Sorrow Total critical care time: 45 minutes Critical care time was exclusive of separately billable procedures and treating other patients. Critical care was necessary to treat or prevent imminent or life-threatening deterioration. Critical care was time spent personally by me on the following activities: development of treatment plan with patient and/or surrogate as well as nursing, discussions with consultants, evaluation of patient's response to treatment, examination of patient, obtaining history from patient or surrogate, ordering and performing treatments and interventions, ordering and review of laboratory studies, ordering and review of radiographic studies, pulse oximetry and re-evaluation of patient's condition.  Patient presenting with rapid heart rate consistent with SVT with a rate in the 160s.  Blood pressure was good not hypotensive.  Patient received adenosine 6 mg which blocked him.  Originally was in sinus and then rapidly went back into an SVT.  Occasionally this SVT was irregular where he would count a block down close time he can see P waves and in front of the QRS, did not look like flutter waves did not look like atrial fibrillation.  Patient had chest pain and since the duration of his rapid heart rate started yesterday.  Had troponins done  both troponins without significant findings first 1 was 30 4 repeat was 37 no  evidence evidence of acute cardiac event.  CBC white count was 12.4 hemoglobin 12.6 platelets 3 2.  Basic metabolic panel not significant change for him sodium 131 potassium 4.1 glucose 161 creatinine 1.37 for a GFR 53 which is kind of his baseline.  Chest x-ray persistent blunting of left costophrenic angle compatible with small effusion.  Since the adenosine did not hold him patient started on diltiazem drip after 10 mg bolus.   This is worked very well.  Patient's heart rate is now down in the 80s.  Blood pressure still good.  No evidence of any acute cardiac event the patient is stable for admission here cardiology can see him tomorrow.  Discussed with the hospitalist service and they will admit.  Final Clinical Impression(s) / ED Diagnoses Final diagnoses:  SVT (supraventricular tachycardia)    Rx / DC Orders ED Discharge Orders     None         Fredia Sorrow, MD 04/08/22 1456

## 2022-04-08 NOTE — H&P (Addendum)
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  The patient reports that the symptoms started yesterday at 5 AM that woke him up from sleeping.  He has been having intermittent bouts of palpitations over the past several weeks.  He has a history of tachyarrhythmia and is treated with daily diltiazem 360 mg.  He receives care with the [facility] and locally with Dr. Lenise Arena [facility].   He reports chest pain and palpitations associated with shortness of breath.  Pain radiates to the head neck and shoulders.  He was recently diagnosed with acute bronchitis last week seen at the local urgent care and treated with intramuscular Decadron and albuterol inhaler.  He was noted to be in SVT with symptoms of heart racing in the emergency department.  His heart rate was in the 160s.  He was treated with adenosine and subsequently placed on IV Cardizem infusion.  His heart rate started to improve after this.  His high-sensitivity troponin tests were 34, 37.  His chest x-ray suggested bronchitis but no pulmonary edema or pneumonia seen.  He is being admitted to the stepdown ICU for further  management.   Past Medical History:  Diagnosis Date   Adenomatous colon polyp 11/22/2008   Dr. Gala Romney   Allergy    Carotid artery stenosis    Chronic back pain    CKD (chronic kidney disease)    DDD (degenerative disc disease), lumbar    GERD (gastroesophageal reflux disease)    Heart murmur 2023   Hemorrhoids, external    High cholesterol    History of kidney stones 2013   HTN (hypertension)    Hyperplastic colon polyp 11/22/2008   Dr. Gala Romney   Hypothyroidism    Kidney stone    "14 years ago" per pt   Meniere's disease    Meniere's disease    Pneumonia 1965   PTSD (post-traumatic stress disorder)    Tachyarrhythmia    Type 2 diabetes mellitus ([place])     Past Surgical History:  Procedure Laterality Date   CATARACT EXTRACTION W/PHACO Left 09/28/2016   Procedure: CATARACT EXTRACTION PHACO AND INTRAOCULAR LENS PLACEMENT ([place]);  Surgeon: Baruch Goldmann, MD;  Location: [facility] ORS;  Service: Ophthalmology;  Laterality: Left;  CDE: 4.40   CATARACT EXTRACTION W/PHACO Right 10/26/2016   Procedure: CATARACT EXTRACTION PHACO AND INTRAOCULAR LENS PLACEMENT RIGHT EYE;  Surgeon: Baruch Goldmann, MD;  Location: [facility] ORS;  Service: Ophthalmology;  Laterality: Right;  CDE: 6.74   CHOLECYSTECTOMY     CHOLECYSTECTOMY     COLONOSCOPY  12/10/2008   Dr. Gala Romney- L side diverticula, adenomatous polyp, hyperplastic polyp   COLONOSCOPY  11/16/2003   Dr. Laural Golden- performed exam to cecum,3 polyps- no path report available, external hemorrhoids,    COLONOSCOPY N/A 01/04/2014   Procedure: COLONOSCOPY;  Surgeon: Herbie Baltimore  Hilton Cork, MD;  Location: [facility] END[facility] SUITE;  Service: Endoscopy;  Laterality: N/A;  8:30am   COLONOSCOPY N/A 02/20/2017   Surgeon: Daneil Dolin, MD; multiple colon polyps ranging 3 to 9 mm in size.  Pathology with tubular adenomas.  Recommended repeat in 3 years.   COLONOSCOPY WITH PROPOFOL N/A 10/03/2020   Procedure: COLONOSCOPY WITH PROPOFOL;  Surgeon: Daneil Dolin, MD;  Location: [facility] ENDO  SUITE;  Service: Endoscopy;  Laterality: N/A;  8:30am   ENDARTERECTOMY Left 11/20/2021   Procedure: LEFT CAROTID ENDARTERECTOMY;  Surgeon: Broadus John, MD;  Location: [facility] OR;  Service: Vascular;  Laterality: Left;   ESOPHAGOGASTRODUODENOSCOPY  08/31/2011   [id number] esophageal erosions consistent with mild erosive reflux esophagitis   MOUTH SURGERY     POLYPECTOMY  10/03/2020   Procedure: POLYPECTOMY;  Surgeon: Daneil Dolin, MD;  Location: [facility] SU[facility];  Service: Endoscopy;;     reports that he quit smoking about 39 years ago. His smoking use included cigarettes. He has a 48.00 pack-year smoking history. He has never used smokeless tobacco. He reports current alcohol use. He reports that he does not use drugs.  Allergies  Allergen Reactions   Atorvastatin Other (See Comments)    Joint and muscle aches, weakness present   Zetia [Ezetimibe] Other (See Comments)    Elevated liver enzymes   Zocor [Simvastatin] Other (See Comments)    Joint and muscle aches   Codeine Other (See Comments)    Passed out    Family History  Problem Relation Age of Onset   Lung cancer Mother    Heart attack Father    Colon cancer Brother 47   Alzheimer's disease Brother    Alzheimer's disease Brother    Alzheimer's disease Brother    Ankylosing spondylitis Child    Thyroid nodules Child     Prior to Admission medications   Medication Sig Start Date End Date Taking? Authorizing Provider  acetaminophen (TYLENOL) 325 MG tablet Take 650 mg by mouth every 6 (six) hours as needed for moderate pain.    [provider]  albuterol (VENTOLIN HFA) 108 (90 Base) MCG/ACT inhaler TAKE 2 PUFFS BY MOUTH EVERY 6 HOURS AS NEEDED FOR WHEEZE OR SHORTNESS OF BREATH 11/02/21   Cook, Jayce G, DO  Alirocumab (PRALUENT) 150 MG/ML SOAJ INJECT 1 PEN INTO THE SKIN EVERY 14 (FOURTEEN) DAYS. 01/09/22   Josue Hector, MD  Ascorbic Acid (VITAMIN C) 1000 MG tablet Take 1,000 mg by mouth 3 (three) times a week.     [provider]  b complex vitamins capsule Take 1 capsule by mouth once a week.    [provider]  Cholecalciferol (VITAMIN D3) 125 MCG (5000 UT) CAPS Take 5,000 Units by mouth 2 (two) times a week.    [provider]  clopidogrel (PLAVIX) 75 MG tablet TAKE 1 TABLET BY MOUTH EVERY DAY 02/16/22   Waynetta Sandy, MD  diazepam (VALIUM) 2 MG tablet TAKE ONE TABLET (2MG  TOTAL) BY MOUTH DAILY AS NEEDED FOR DIZZINESS SECONDARY TO MENIERE'S DISEASE 02/13/22   Coral Spikes, DO  diclofenac Sodium (VOLTAREN) 1 % GEL Apply 2 g topically daily as needed (pain). 09/29/21   [provider]  DULoxetine (CYMBALTA) 60 MG capsule Take 1 capsule (60 mg total) by mouth daily. 05/11/21   Coral Spikes, DO  glipiZIDE (GLUCOTROL) 5 MG tablet Take 1 tablet (5 mg total) by mouth 2 (two) times daily before a meal. 01/10/22   Marinus Maw,  Whitney J, NP  hydrALAZINE (APRESOLINE) 25 MG tablet Take 50 mg by mouth 3 (three) times daily. 10/17/21   [provider]  levothyroxine (SYNTHROID) 25 MCG tablet TAKE ONE TABLET BY MOUTH DAILY FOR HYPOTHYROIDISM 04/12/21   [provider]  Multiple Vitamin (MULTIVITAMIN WITH MINERALS) TABS Take 1 tablet by mouth daily.    [provider]  pantoprazole (PROTONIX) 40 MG tablet TAKE 1 TABLET BY MOUTH EVERY DAY 12/30/20   Coral Spikes, DO  primidone (MYSOLINE) 50 MG tablet Take 1 tablet (50 mg total) by mouth 2 (two) times daily. 03/26/22   Tat, Eustace Quail, DO  sodium chloride (OCEAN) 0.65 % SOLN nasal spray Place 1 spray into both nostrils at bedtime.    [provider]  TIADYLT ER 360 MG 24 hr capsule TAKE 1 CAPSULE BY MOUTH EVERY DAY 01/02/21   Coral Spikes, DO  traMADol (ULTRAM) 50 MG tablet Take 1 tablet (50 mg total) by mouth every 6 (six) hours as needed. 11/21/21 11/21/22  Dagoberto Ligas, PA-C    Physical Exam: Vitals:   04/08/22 1100 04/08/22 1130 04/08/22 1330 04/08/22 1430  BP: 123/84 (!) 131/107 128/69  113/66  Pulse: (!) 158 63 (!) 123 61  Resp: 19 14 13 13   Temp:      TempSrc:      SpO2: 92% 98% 100% 98%  Weight:      Height:        Constitutional: somnolent but arousable; NAD, calm, comfortable Eyes: PERRL, lids and conjunctivae normal ENMT: Mucous membranes are moist. Posterior pharynx clear of any exudate or lesions.Normal dentition.  Neck: normal, supple, no masses, no thyromegaly Respiratory: clear to auscultation bilaterally, no wheezing, no crackles. Normal respiratory effort. No accessory muscle use.  Cardiovascular: normal s1, s2 sounds, irregular rhythm, no murmurs / rubs / gallops. trace extremity edema. 2+ pedal pulses. No carotid bruits.  Abdomen: no tenderness, no masses palpated. No hepatosplenomegaly. Bowel sounds positive.  Musculoskeletal: no clubbing / cyanosis. No joint deformity upper and lower extremities. Good ROM, no contractures. Normal muscle tone.  Skin: no rashes, lesions, ulcers. No induration Neurologic: CN 2-12 grossly intact. Sensation intact, DTR normal. Strength 5/5 in all 4.  Psychiatric: Normal judgment and insight. Alert and oriented x 3. Normal mood.   Labs on Admission: I have personally reviewed following labs and imaging studies  CBC: Recent Labs  Lab 04/08/22 1053  WBC 12.4*  NEUTROABS 7.9*  HGB 12.6*  HCT 37.2*  MCV 90.1  PLT [id number]   Basic Metabolic Panel: Recent Labs  Lab 04/08/22 1053 04/08/22 1507  NA 131*  --   K 4.1  --   CL 96*  --   CO2 24  --   GLUCOSE 161*  --   BUN 26*  --   CREATININE 1.37*  --   CALCIUM 8.7*  --   MG  --  1.8   GFR: Estimated Creatinine Clearance: 50.3 mL/min (A) (by C-G formula based on SCr of 1.37 mg/dL (H)). Liver Function Tests: No results for input(s): "AST", "ALT", "ALKPHOS", "BILITOT", "PROT", "ALBUMIN" in the last 168 hours. No results for input(s): "LIPASE", "AMYLASE" in the last 168 hours. No results for input(s): "AMMONIA" in the last 168 hours. Coagulation Profile: No results  for input(s): "INR", "PROTIME" in the last 168 hours. Cardiac Enzymes: No results for input(s): "CKTOTAL", "CKMB", "CKMBINDEX", "TROPONINI" in the last 168 hours. BNP (last 3 results) No results for input(s): "PROBNP" in the last 8760 hours. HbA1C:  No results for input(s): "HGBA1C" in the last 72 hours. CBG: No results for input(s): "GLUCAP" in the last 168 hours. Lipid Profile: No results for input(s): "CHOL", "HDL", "LDLCALC", "TRIG", "CHOLHDL", "LDLDIRECT" in the last 72 hours. Thyroid Function Tests: No results for input(s): "TSH", "T4TOTAL", "FREET4", "T3FREE", "THYROIDAB" in the last 72 hours. Anemia Panel: No results for input(s): "VITAMINB12", "FOLATE", "FERRITIN", "TIBC", "IRON", "RETICCTPCT" in the last 72 hours. Urine analysis:    Component Value Date/Time   COLORURINE YELLOW 11/14/2021 1255   APPEARANCEUR HAZY (A) 11/14/2021 1255   LABSPEC 1.012 11/14/2021 1255   PHURINE 6.0 11/14/2021 1255   GLUCOSEU NEGATIVE 11/14/2021 1255   HGBUR NEGATIVE 11/14/2021 [place] 11/14/2021 1255   KETONESUR NEGATIVE 11/14/2021 1255   PROTEINUR 100 (A) 11/14/2021 1255   UROBILINOGEN 1.0 04/04/2012 0754   NITRITE NEGATIVE 11/14/2021 1255   LEUKOCYTESUR SMALL (A) 11/14/2021 1255    Radiological Exams on Admission: DG Chest Port 1 View  Result Date: 04/08/2022 CLINICAL DATA:  Rapid heart rate. Chest pain and shortness of breath. Dizziness and weakness. EXAM: PORTABLE CHEST 1 VIEW COMPARISON:  04/03/2012 FINDINGS: Heart size and mediastinal contours are stable. Aortic atherosclerosis. Lung volumes are low. Blunting of the left costophrenic angle noted, similar to previous exam. No signs of interstitial edema or airspace consolidation. IMPRESSION: 1. Low lung volumes. 2. Persistent blunting of the left costophrenic angle compatible with small effusion. Electronically Signed   By: Kerby Moors M.D.   On: 04/08/2022 11:41    EKG: Independently reviewed. SVT HR in 160s.    Assessment/Plan Principal Problem:   Paroxysmal SVT (supraventricular tachycardia) Active Problems:   GERD (gastroesophageal reflux disease)   Hyperlipidemia LDL goal <70   Essential hypertension, benign   Meniere's disease   Aortic atherosclerosis (HCC)   Statin myopathy   Type 2 diabetes mellitus with diabetic nephropathy, without long-term current use of insulin (HCC)   Stage 3a chronic kidney disease (HCC)   Diabetic neuropathy (HCC)   Generalized anxiety disorder   Hypothyroidism   Carotid artery stenosis   Cogwheel rigidity   SOB (shortness of breath)   Intention tremor   Hyponatremia   Leukocytosis   Anemia, unspecified   Chest discomfort   PVD (peripheral vascular disease) (HCC)    Tachyarrhythmias with EKG evidence of SVT  - presented with symptomatic SVT HR 160s  - continue IV diltiazem started in ED  - check magnesium, TSH  - check urine toxicology screen  - admit to stepdown ICU for close monitoring  - hold home oral diltiazem 360 mg  - 2D echocardiogram ordered   Uncontrolled type 2 diabetes mellitus with renal, vascular and neurological complications - as evidenced by an A1c>7% - he is followed by [facility] - he failed management with SGLT with worsening renal function - they are considering GLP1 therapy at next office visit - Semglee 5 units daily with prandial novolog 3 units TID with meals>50% eatent, plus SSI and CBG 5x/day - updated A1c pending  - lipid panel, TSH, 25-OH vitamin D pending  - holding home glipizide while inpatient   Leukocytosis  - secondary to IM decadron injection given on 3/14 at the local urgent care  - recheck CBC/diff in AM   Anemia, Unspecified - he is followed by Dr. Gala Romney from GI service - he has a history of hyperplastic colon polyps   PVD s/p left CEA - he is on clopidogrel 75 mg daily since procedure - continue daily clopidogrel for  now  PTSD with GAD - resume home cymbalta treatment  -  diazepam ordered PRN   Symptomatic diabetic neuropathy  - resume home duloxetine 60 mg   Intention Tremor - started on primidone 50 mg BID by his neurologist   GERD - pantoprazole 40 mg daily for GI protection   Meniere's Disease - diazepam 2 mg tablets PRN symptoms   Hypothyroidism  - resume home dose levothyroxine  - updated TSH pending   Hyperlipidemia/diabetic dyslipidemia - pt is reportedly intolerant to statins and ezetimibe   Stage 3a CKD  - renal function stable from recent tests - renally dose medications as appropriate   Critical Care Procedure Note Authorized and Performed by: Murvin Natal MD  Total Critical Care time:  63 mins  Due to a high probability of clinically significant, life threatening deterioration, the patient required my highest level of preparedness to intervene emergently and I personally spent this critical care time directly and personally managing the patient.  This critical care time included obtaining a history; examining the patient, pulse oximetry; ordering and review of studies; arranging urgent treatment with development of a management plan; evaluation of patient's response of treatment; frequent reassessment; and discussions with other providers.  This critical care time was performed to assess and manage the high probability of imminent and life threatening deterioration that could result in multi-organ failure.  It was exclusive of separately billable procedures and treating other patients and teaching time.    DVT prophylaxis: [place] heparin   Code Status: Full   Family Communication: wife bedside   Disposition Plan: TBD   Consults called: cardiology   Admission status: OBV   Level of care: Stepdown Irwin Brakeman MD [facility] How to contact the [facility] Attending or Consulting provider [place] or covering provider during after hours [place], for this patient?  Check the care team in [facility] and look for a) attending/consulting [facility]  provider listed and b) the [facility] team listed Log into [email or web address] and use [place]'s universal password to access. If you do not have the password, please contact the hospital operator. Locate the [facility] provider you are looking for under [facility] and page to a number that you can be directly reached. If you still have difficulty reaching the provider, please page the [facility] (Director on Call) for the Hospitalists listed on amion for assistance.   If 7PM-7AM, please contact night-coverage [email or web address] Password [facility]  04/08/2022, 3:56 PM

## 2022-04-08 NOTE — Hospital Course (Addendum)
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  The patient reports that the symptoms started yesterday at 5 AM that woke him up from sleeping.  He has been having intermittent bouts of palpitations over the past several weeks.  He has a history of tachyarrhythmia and is treated with daily diltiazem 360 mg.  He receives care with the [facility] and locally with Dr. Lenise Arena [facility].   He reports chest pain and palpitations associated with shortness of breath.  Pain radiates to the head neck and shoulders.  He was recently diagnosed with acute bronchitis last week seen at the local urgent care and treated with intramuscular Decadron and albuterol inhaler.  He was noted to be in SVT with symptoms of heart racing in the emergency department.  His heart rate was in the 160s.  He was treated with adenosine and subsequently placed on IV Cardizem infusion.  His heart rate started to improve after this.  His high-sensitivity troponin tests were 34, 37.  His chest x-ray suggested bronchitis but no pulmonary edema or pneumonia seen.  He is being admitted to the stepdown ICU for further management.

## 2022-04-08 NOTE — ED Triage Notes (Signed)
Pt c/o chest pain with sob, dizziness and weakness that started at 5am yesterday; pt states the pain woke him from sleep  Pt states the pain radiates to his head, neck and shoulders and is worse with movement  Pt states he took tylenol yesterday with some relief  Pt was diagnosed with bronchitis on Thursday

## 2022-04-08 NOTE — ED Notes (Signed)
MD at bedside. Pt will receive adenosine. Pt placed on pads, 2L of O2 in place.  Pt HR 160s at this time.

## 2022-04-09 ENCOUNTER — Observation Stay (HOSPITAL_COMMUNITY): Payer: Medicare Other

## 2022-04-09 ENCOUNTER — Encounter (HOSPITAL_COMMUNITY)
Admission: EM | Disposition: A | Discharge status: Home / Self Care | Payer: Self-pay | Source: Home / Self Care | Attending: Cardiology

## 2022-04-09 ENCOUNTER — Observation Stay (HOSPITAL_BASED_OUTPATIENT_CLINIC_OR_DEPARTMENT_OTHER): Payer: Medicare Other

## 2022-04-09 ENCOUNTER — Other Ambulatory Visit (HOSPITAL_COMMUNITY): Payer: Self-pay | Admitting: *Deleted

## 2022-04-09 DIAGNOSIS — F4310 Post-traumatic stress disorder, unspecified: Secondary | ICD-10-CM | POA: Diagnosis present

## 2022-04-09 DIAGNOSIS — J9601 Acute respiratory failure with hypoxia: Secondary | ICD-10-CM | POA: Diagnosis not present

## 2022-04-09 DIAGNOSIS — E1142 Type 2 diabetes mellitus with diabetic polyneuropathy: Secondary | ICD-10-CM | POA: Diagnosis not present

## 2022-04-09 DIAGNOSIS — I4891 Unspecified atrial fibrillation: Secondary | ICD-10-CM

## 2022-04-09 DIAGNOSIS — E871 Hypo-osmolality and hyponatremia: Secondary | ICD-10-CM | POA: Diagnosis present

## 2022-04-09 DIAGNOSIS — I200 Unstable angina: Secondary | ICD-10-CM | POA: Diagnosis present

## 2022-04-09 DIAGNOSIS — G252 Other specified forms of tremor: Secondary | ICD-10-CM

## 2022-04-09 DIAGNOSIS — E039 Hypothyroidism, unspecified: Secondary | ICD-10-CM | POA: Diagnosis present

## 2022-04-09 DIAGNOSIS — Z789 Other specified health status: Secondary | ICD-10-CM

## 2022-04-09 DIAGNOSIS — Z8673 Personal history of transient ischemic attack (TIA), and cerebral infarction without residual deficits: Secondary | ICD-10-CM | POA: Diagnosis not present

## 2022-04-09 DIAGNOSIS — Z87891 Personal history of nicotine dependence: Secondary | ICD-10-CM | POA: Diagnosis not present

## 2022-04-09 DIAGNOSIS — I700 Atherosclerosis of aorta: Secondary | ICD-10-CM | POA: Diagnosis present

## 2022-04-09 DIAGNOSIS — I480 Paroxysmal atrial fibrillation: Secondary | ICD-10-CM | POA: Diagnosis not present

## 2022-04-09 DIAGNOSIS — Z79899 Other long term (current) drug therapy: Secondary | ICD-10-CM | POA: Diagnosis not present

## 2022-04-09 DIAGNOSIS — F411 Generalized anxiety disorder: Secondary | ICD-10-CM | POA: Diagnosis present

## 2022-04-09 DIAGNOSIS — E7800 Pure hypercholesterolemia, unspecified: Secondary | ICD-10-CM | POA: Diagnosis present

## 2022-04-09 DIAGNOSIS — I4819 Other persistent atrial fibrillation: Secondary | ICD-10-CM | POA: Diagnosis not present

## 2022-04-09 DIAGNOSIS — E1122 Type 2 diabetes mellitus with diabetic chronic kidney disease: Secondary | ICD-10-CM | POA: Diagnosis present

## 2022-04-09 DIAGNOSIS — N1831 Chronic kidney disease, stage 3a: Secondary | ICD-10-CM | POA: Diagnosis present

## 2022-04-09 DIAGNOSIS — I4719 Other supraventricular tachycardia: Secondary | ICD-10-CM | POA: Diagnosis present

## 2022-04-09 DIAGNOSIS — Z8249 Family history of ischemic heart disease and other diseases of the circulatory system: Secondary | ICD-10-CM | POA: Diagnosis not present

## 2022-04-09 DIAGNOSIS — E785 Hyperlipidemia, unspecified: Secondary | ICD-10-CM | POA: Diagnosis not present

## 2022-04-09 DIAGNOSIS — E0590 Thyrotoxicosis, unspecified without thyrotoxic crisis or storm: Secondary | ICD-10-CM | POA: Diagnosis present

## 2022-04-09 DIAGNOSIS — I471 Supraventricular tachycardia, unspecified: Secondary | ICD-10-CM | POA: Diagnosis present

## 2022-04-09 DIAGNOSIS — I10 Essential (primary) hypertension: Secondary | ICD-10-CM | POA: Diagnosis not present

## 2022-04-09 DIAGNOSIS — I129 Hypertensive chronic kidney disease with stage 1 through stage 4 chronic kidney disease, or unspecified chronic kidney disease: Secondary | ICD-10-CM | POA: Diagnosis present

## 2022-04-09 DIAGNOSIS — Z794 Long term (current) use of insulin: Secondary | ICD-10-CM | POA: Diagnosis not present

## 2022-04-09 DIAGNOSIS — F32A Depression, unspecified: Secondary | ICD-10-CM | POA: Diagnosis present

## 2022-04-09 DIAGNOSIS — I5032 Chronic diastolic (congestive) heart failure: Secondary | ICD-10-CM | POA: Diagnosis not present

## 2022-04-09 DIAGNOSIS — I6522 Occlusion and stenosis of left carotid artery: Secondary | ICD-10-CM

## 2022-04-09 DIAGNOSIS — H8103 Meniere's disease, bilateral: Secondary | ICD-10-CM | POA: Diagnosis not present

## 2022-04-09 DIAGNOSIS — K219 Gastro-esophageal reflux disease without esophagitis: Secondary | ICD-10-CM | POA: Diagnosis present

## 2022-04-09 DIAGNOSIS — E1121 Type 2 diabetes mellitus with diabetic nephropathy: Secondary | ICD-10-CM | POA: Diagnosis not present

## 2022-04-09 DIAGNOSIS — E1165 Type 2 diabetes mellitus with hyperglycemia: Secondary | ICD-10-CM | POA: Diagnosis present

## 2022-04-09 DIAGNOSIS — E1151 Type 2 diabetes mellitus with diabetic peripheral angiopathy without gangrene: Secondary | ICD-10-CM | POA: Diagnosis present

## 2022-04-09 DIAGNOSIS — E1140 Type 2 diabetes mellitus with diabetic neuropathy, unspecified: Secondary | ICD-10-CM | POA: Diagnosis present

## 2022-04-09 DIAGNOSIS — J811 Chronic pulmonary edema: Secondary | ICD-10-CM | POA: Diagnosis present

## 2022-04-09 HISTORY — PX: LEFT HEART CATH AND CORONARY ANGIOGRAPHY: CATH118249

## 2022-04-09 LAB — BASIC METABOLIC PANEL
Anion gap: 8 (ref 5–15)
BUN: 26 mg/dL — ABNORMAL HIGH (ref 8–23)
CO2: 23 mmol/L (ref 22–32)
Calcium: 8.1 mg/dL — ABNORMAL LOW (ref 8.9–10.3)
Chloride: 98 mmol/L (ref 98–111)
Creatinine, Ser: 1.28 mg/dL — ABNORMAL HIGH (ref 0.61–1.24)
GFR, Estimated: 57 mL/min — ABNORMAL LOW (ref >60)
Glucose, Bld: 143 mg/dL — ABNORMAL HIGH (ref 70–99)
Potassium: 4 mmol/L (ref 3.5–5.1)
Sodium: 129 mmol/L — ABNORMAL LOW (ref 135–145)

## 2022-04-09 LAB — CREATININE, SERUM
Creatinine, Ser: 1.54 mg/dL — ABNORMAL HIGH (ref 0.61–1.24)
GFR, Estimated: 46 mL/min — ABNORMAL LOW (ref >60)

## 2022-04-09 LAB — GLUCOSE, CAPILLARY
Glucose-Capillary: 125 mg/dL — ABNORMAL HIGH (ref 70–99)
Glucose-Capillary: 151 mg/dL — ABNORMAL HIGH (ref 70–99)
Glucose-Capillary: 207 mg/dL — ABNORMAL HIGH (ref 70–99)
Glucose-Capillary: 119 mg/dL — ABNORMAL HIGH (ref 70–99)

## 2022-04-09 LAB — CBC WITH DIFFERENTIAL/PLATELET
Abs Immature Granulocytes: 0.04 10*3/uL (ref 0.00–0.07)
Basophils Absolute: 0 10*3/uL (ref 0.0–0.1)
Basophils Relative: 0 %
Eosinophils Absolute: 0.2 10*3/uL (ref 0.0–0.5)
Eosinophils Relative: 2 %
HCT: 33.1 % — ABNORMAL LOW (ref 39.0–52.0)
Hemoglobin: 11.1 g/dL — ABNORMAL LOW (ref 13.0–17.0)
Immature Granulocytes: 0 %
Lymphocytes Relative: 14 %
Lymphs Abs: 1.4 10*3/uL (ref 0.7–4.0)
MCH: 30.3 pg (ref 26.0–34.0)
MCHC: 33.5 g/dL (ref 30.0–36.0)
MCV: 90.4 fL (ref 80.0–100.0)
Monocytes Absolute: 1.2 10*3/uL — ABNORMAL HIGH (ref 0.1–1.0)
Monocytes Relative: 13 %
Neutro Abs: 7 10*3/uL (ref 1.7–7.7)
Neutrophils Relative %: 71 %
Platelets: 273 10*3/uL (ref 150–400)
RBC: 3.66 MIL/uL — ABNORMAL LOW (ref 4.22–5.81)
RDW: 13.2 % (ref 11.5–15.5)
WBC: 9.8 10*3/uL (ref 4.0–10.5)
nRBC: 0 % (ref 0.0–0.2)

## 2022-04-09 LAB — RAPID URINE DRUG SCREEN, HOSP PERFORMED
Amphetamines: NOT DETECTED
Barbiturates: NOT DETECTED
Benzodiazepines: POSITIVE — AB
Cocaine: NOT DETECTED
Opiates: NOT DETECTED
Tetrahydrocannabinol: NOT DETECTED

## 2022-04-09 LAB — HEMOGLOBIN A1C
Hgb A1c MFr Bld: 6.9 % — ABNORMAL HIGH (ref 4.8–5.6)
Mean Plasma Glucose: 151 mg/dL

## 2022-04-09 LAB — BRAIN NATRIURETIC PEPTIDE: B Natriuretic Peptide: 180 pg/mL — ABNORMAL HIGH (ref 0.0–100.0)

## 2022-04-09 LAB — TROPONIN I (HIGH SENSITIVITY)
Troponin I (High Sensitivity): 28 ng/L — ABNORMAL HIGH (ref <18)
Troponin I (High Sensitivity): 24 ng/L — ABNORMAL HIGH (ref <18)
Troponin I (High Sensitivity): 23 ng/L — ABNORMAL HIGH (ref <18)
Troponin I (High Sensitivity): 22 ng/L — ABNORMAL HIGH (ref <18)

## 2022-04-09 LAB — ECHOCARDIOGRAM COMPLETE
Area-P 1/2: 3.42 cm2
Height: 69 in
S' Lateral: 2.5 cm
Weight: 3291.03 oz

## 2022-04-09 LAB — LIPID PANEL
Cholesterol: 128 mg/dL (ref 0–200)
HDL: 63 mg/dL (ref >40)
LDL Cholesterol: 41 mg/dL (ref 0–99)
Total CHOL/HDL Ratio: 2 RATIO
Triglycerides: 120 mg/dL (ref <150)
VLDL: 24 mg/dL (ref 0–40)

## 2022-04-09 LAB — CBC
HCT: 29.7 % — ABNORMAL LOW (ref 39.0–52.0)
Hemoglobin: 10 g/dL — ABNORMAL LOW (ref 13.0–17.0)
MCH: 30.5 pg (ref 26.0–34.0)
MCHC: 33.7 g/dL (ref 30.0–36.0)
MCV: 90.5 fL (ref 80.0–100.0)
Platelets: 256 10*3/uL (ref 150–400)
RBC: 3.28 MIL/uL — ABNORMAL LOW (ref 4.22–5.81)
RDW: 13.1 % (ref 11.5–15.5)
WBC: 8.5 10*3/uL (ref 4.0–10.5)
nRBC: 0 % (ref 0.0–0.2)

## 2022-04-09 LAB — T4, FREE: Free T4: 1.31 ng/dL — ABNORMAL HIGH (ref 0.61–1.12)

## 2022-04-09 LAB — MAGNESIUM: Magnesium: 2.7 mg/dL — ABNORMAL HIGH (ref 1.7–2.4)

## 2022-04-09 SURGERY — LEFT HEART CATH AND CORONARY ANGIOGRAPHY
Anesthesia: LOCAL

## 2022-04-09 MED ORDER — VERAPAMIL HCL 2.5 MG/ML IV SOLN
INTRAVENOUS | Status: AC
  Filled 2022-04-09: qty 2

## 2022-04-09 MED ORDER — SODIUM CHLORIDE 0.9 % WEIGHT BASED INFUSION: 3.0000 mL/kg/h | INTRAVENOUS | Status: DC

## 2022-04-09 MED ORDER — HYDRALAZINE HCL 25 MG PO TABS
50.0000 mg | ORAL_TABLET | Freq: Three times a day (TID) | ORAL | Status: DC
  Administered 2022-04-09 (×2): 50 mg via ORAL
  Filled 2022-04-09 (×2): qty 2

## 2022-04-09 MED ORDER — SODIUM CHLORIDE 0.9 % WEIGHT BASED INFUSION: 1.0000 mL/kg/h | INTRAVENOUS | Status: DC

## 2022-04-09 MED ORDER — HEPARIN SODIUM (PORCINE) 1000 UNIT/ML IJ SOLN
INTRAMUSCULAR | Status: AC
  Filled 2022-04-09: qty 10

## 2022-04-09 MED ORDER — LIDOCAINE HCL (PF) 1 % IJ SOLN
INTRAMUSCULAR | Status: AC
  Filled 2022-04-09: qty 30

## 2022-04-09 MED ORDER — CARVEDILOL 6.25 MG PO TABS
6.2500 mg | ORAL_TABLET | Freq: Two times a day (BID) | ORAL | Status: DC
  Administered 2022-04-09 (×2): 6.25 mg via ORAL
  Filled 2022-04-09: qty 2
  Filled 2022-04-09: qty 1

## 2022-04-09 MED ORDER — ASPIRIN 81 MG PO CHEW: 81.0000 mg | CHEWABLE_TABLET | ORAL | Status: DC

## 2022-04-09 MED ORDER — SODIUM CHLORIDE 0.9% FLUSH
3.0000 mL | Freq: Two times a day (BID) | INTRAVENOUS | Status: DC
  Administered 2022-04-09 – 2022-04-11 (×3): 3 mL via INTRAVENOUS

## 2022-04-09 MED ORDER — HEPARIN SODIUM (PORCINE) 1000 UNIT/ML IJ SOLN
INTRAMUSCULAR | Status: DC | PRN
  Administered 2022-04-09: 4500 [IU] via INTRAVENOUS

## 2022-04-09 MED ORDER — HEPARIN (PORCINE) 25000 UT/250ML-% IV SOLN
1100.0000 [IU]/h | INTRAVENOUS | Status: DC
  Administered 2022-04-09: 1100 [IU]/h via INTRAVENOUS
  Filled 2022-04-09 (×2): qty 250

## 2022-04-09 MED ORDER — HEPARIN BOLUS VIA INFUSION
3000.0000 [IU] | Freq: Once | INTRAVENOUS | Status: AC
  Administered 2022-04-09: 3000 [IU] via INTRAVENOUS
  Filled 2022-04-09: qty 3000

## 2022-04-09 MED ORDER — ASPIRIN 81 MG PO CHEW
81.0000 mg | CHEWABLE_TABLET | ORAL | Status: AC
  Administered 2022-04-09: 81 mg via ORAL
  Filled 2022-04-09: qty 1

## 2022-04-09 MED ORDER — LABETALOL HCL 5 MG/ML IV SOLN: 10.0000 mg | INTRAVENOUS | Status: AC | PRN

## 2022-04-09 MED ORDER — HEPARIN SODIUM (PORCINE) 5000 UNIT/ML IJ SOLN
5000.0000 [IU] | Freq: Three times a day (TID) | INTRAMUSCULAR | Status: DC
  Administered 2022-04-09 – 2022-04-10 (×2): 5000 [IU] via SUBCUTANEOUS
  Filled 2022-04-09 (×2): qty 1

## 2022-04-09 MED ORDER — METOPROLOL TARTRATE 5 MG/5ML IV SOLN
5.0000 mg | Freq: Once | INTRAVENOUS | Status: AC
  Administered 2022-04-09: 5 mg via INTRAVENOUS
  Filled 2022-04-09: qty 5

## 2022-04-09 MED ORDER — METOPROLOL TARTRATE 25 MG PO TABS
25.0000 mg | ORAL_TABLET | Freq: Two times a day (BID) | ORAL | Status: DC
  Administered 2022-04-09: 25 mg via ORAL
  Filled 2022-04-09: qty 1

## 2022-04-09 MED ORDER — HEPARIN (PORCINE) IN NACL 1000-0.9 UT/500ML-% IV SOLN
INTRAVENOUS | Status: DC | PRN
  Administered 2022-04-09 (×2): 500 mL

## 2022-04-09 MED ORDER — SODIUM CHLORIDE 0.9 % IV SOLN: 250.0000 mL | INTRAVENOUS | Status: DC | PRN

## 2022-04-09 MED ORDER — SODIUM CHLORIDE 0.9% FLUSH
3.0000 mL | Freq: Two times a day (BID) | INTRAVENOUS | Status: DC
  Administered 2022-04-09 – 2022-04-11 (×5): 3 mL via INTRAVENOUS

## 2022-04-09 MED ORDER — FENTANYL CITRATE (PF) 100 MCG/2ML IJ SOLN
INTRAMUSCULAR | Status: AC
  Filled 2022-04-09: qty 2

## 2022-04-09 MED ORDER — MIDAZOLAM HCL 2 MG/2ML IJ SOLN
INTRAMUSCULAR | Status: DC | PRN
  Administered 2022-04-09 (×2): 1 mg via INTRAVENOUS

## 2022-04-09 MED ORDER — HYDRALAZINE HCL 20 MG/ML IJ SOLN: 10.0000 mg | INTRAMUSCULAR | Status: AC | PRN

## 2022-04-09 MED ORDER — VERAPAMIL HCL 2.5 MG/ML IV SOLN
INTRAVENOUS | Status: DC | PRN
  Administered 2022-04-09: 10 mL via INTRA_ARTERIAL

## 2022-04-09 MED ORDER — MIDAZOLAM HCL 2 MG/2ML IJ SOLN
INTRAMUSCULAR | Status: AC
  Filled 2022-04-09: qty 2

## 2022-04-09 MED ORDER — SODIUM CHLORIDE 0.9% FLUSH: 3.0000 mL | INTRAVENOUS | Status: DC | PRN

## 2022-04-09 MED ORDER — SODIUM CHLORIDE 0.9 % WEIGHT BASED INFUSION
1.0000 mL/kg/h | INTRAVENOUS | Status: DC
  Administered 2022-04-09: 1 mL/kg/h via INTRAVENOUS

## 2022-04-09 MED ORDER — IOHEXOL 350 MG/ML SOLN
INTRAVENOUS | Status: DC | PRN
  Administered 2022-04-09: 45 mL

## 2022-04-09 MED ORDER — FENTANYL CITRATE (PF) 100 MCG/2ML IJ SOLN
INTRAMUSCULAR | Status: DC | PRN
  Administered 2022-04-09 (×2): 25 ug via INTRAVENOUS

## 2022-04-09 MED ORDER — SODIUM CHLORIDE 0.9 % IV SOLN: INTRAVENOUS | Status: AC

## 2022-04-09 MED ORDER — LIDOCAINE HCL (PF) 1 % IJ SOLN
INTRAMUSCULAR | Status: DC | PRN
  Administered 2022-04-09: 2 mL

## 2022-04-09 SURGICAL SUPPLY — 9 items
CATH OPTITORQUE TIG 4.0 5F (CATHETERS) IMPLANT
DEVICE RAD COMP TR BAND LRG (VASCULAR PRODUCTS) IMPLANT
GLIDESHEATH SLEND SS 6F .021 (SHEATH) IMPLANT
GUIDEWIRE INQWIRE 1.5J.035X260 (WIRE) IMPLANT
INQWIRE 1.5J .035X260CM (WIRE) ×1
KIT HEART LEFT (KITS) ×1 IMPLANT
PACK CARDIAC CATHETERIZATION (CUSTOM PROCEDURE TRAY) ×1 IMPLANT
TRANSDUCER W/STOPCOCK (MISCELLANEOUS) ×1 IMPLANT
TUBING CIL FLEX 10 FLL-RA (TUBING) ×1 IMPLANT

## 2022-04-09 NOTE — Progress Notes (Signed)
ANTICOAGULATION CONSULT NOTE - Initial Consult  Pharmacy Consult for heparin Indication: chest pain/ACS  Allergies  Allergen Reactions   Atorvastatin Other (See Comments)    Joint and muscle aches, weakness present   Zetia [Ezetimibe] Other (See Comments)    Elevated liver enzymes   Zocor [Simvastatin] Other (See Comments)    Joint and muscle aches   Codeine Other (See Comments)    Passed out    Patient Measurements: Height: 5\' 9"  (175.3 cm) Weight: 93.3 kg (205 lb 11 oz) IBW/kg (Calculated) : 70.7 Heparin Dosing Weight: 90kg  Vital Signs: Temp: 99.5 F (37.5 C) (03/18 0739) Temp Source: Oral (03/18 0739) BP: 144/94 (03/18 0900) Pulse Rate: 94 (03/18 0900)  Labs: Recent Labs    04/08/22 1053 04/08/22 1305 04/09/22 0304 04/09/22 0621 04/09/22 0831  HGB 12.6*  --  11.1*  --   --   HCT 37.2*  --  33.1*  --   --   PLT 322  --  273  --   --   CREATININE 1.37*  --  1.28*  --   --   TROPONINIHS 34*   < > 24* 23* 22*   < > = values in this interval not displayed.    Estimated Creatinine Clearance: 53.6 mL/min (A) (by C-G formula based on SCr of 1.28 mg/dL (H)).   Medical History: Past Medical History:  Diagnosis Date   Adenomatous colon polyp 11/22/2008   Dr. Gala Romney   Allergy    Carotid artery stenosis    Chronic back pain    CKD (chronic kidney disease)    DDD (degenerative disc disease), lumbar    GERD (gastroesophageal reflux disease)    Heart murmur 2023   Hemorrhoids, external    High cholesterol    History of kidney stones 2013   HTN (hypertension)    Hyperplastic colon polyp 11/22/2008   Dr. Gala Romney   Hypothyroidism    Kidney stone    "14 years ago" per pt   Meniere's disease    Meniere's disease    Pneumonia 1965   PTSD (post-traumatic stress disorder)    Tachyarrhythmia    Type 2 diabetes mellitus ([place])     Assessment: 77 year old male with significant chest pressure and shortness of breath. He was found to be in SVT s/p adenosine. New  orders received to start IV heparin with likely transfer to [facility] for cath this afternoon. Not on anticoagulation prior to admit, hgb just below normal.   Goal of Therapy:  Heparin level 0.3-0.7 units/ml Monitor platelets by anticoagulation protocol: Yes   Plan:  Give 3000 units bolus x 1 Start heparin infusion at 1100 units/hr Check anti-Xa level in 8 hours and daily while on heparin Continue to monitor H&H and platelets  Erin Hearing PharmD., BCPS Clinical Pharmacist 04/09/2022 10:51 AM

## 2022-04-09 NOTE — H&P (View-Only) (Signed)
Cardiology Consultation   Patient ID: Kyle Dunlap MRN: [id number]; DOB: [birth year]  Admit date: 04/08/2022 Date of Consult: 04/09/2022  PCP:  Coral Spikes, [place] [facility] Cardiologist:  Jenkins Rouge, MD        Patient Profile:   Kyle Dunlap is a 77 y.o. 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 Wynetta Emery.  History of Present Illness:   Kyle Dunlap with above PMhx came to the ED with chest pressure into his shoulders and shortness of breath that awakened him from sleep. He was found to be in SVT and slowed down with adenosine and diltiazem but still having frequent runs SVT. Chest pain resolved-never had before but has some ST elevation inf/lat on EKG. Patient has been on diltiazem for years for palpitations.  Coronary calcium score 42 03/2020. . Troponins flat 28,24,23, BNP 180, Mg 2.7(1.8 yest) Na 129, crt 1.28. was seen in urgent care Thurs for cough and told he had bronchitis. Was taking albuterol inhaler and given IM decadron plus Corcidin HBP.     Past Medical History:  Diagnosis Date   Adenomatous colon polyp 11/22/2008   Dr. Gala Romney   Allergy    Carotid artery stenosis    Chronic back pain    CKD (chronic kidney disease)    DDD (degenerative disc disease), lumbar    GERD (gastroesophageal reflux disease)    Heart murmur 2023   Hemorrhoids, external    High cholesterol    History of kidney stones 2013   HTN (hypertension)    Hyperplastic colon polyp 11/22/2008   Dr. Gala Romney   Hypothyroidism    Kidney stone    "14 years ago" per pt   Meniere's disease    Meniere's disease    Pneumonia 1965   PTSD (post-traumatic stress disorder)    Tachyarrhythmia    Type 2 diabetes mellitus ([place])     Past Surgical History:  Procedure Laterality Date   CATARACT EXTRACTION W/PHACO Left 09/28/2016   Procedure: CATARACT EXTRACTION PHACO AND  INTRAOCULAR LENS PLACEMENT ([place]);  Surgeon: Baruch Goldmann, MD;  Location: [facility];  Service: Ophthalmology;  Laterality: Left;  CDE: 4.40   CATARACT EXTRACTION W/PHACO Right 10/26/2016   Procedure: CATARACT EXTRACTION PHACO AND INTRAOCULAR LENS PLACEMENT RIGHT EYE;  Surgeon: Baruch Goldmann, MD;  Location: [facility];  Service: Ophthalmology;  Laterality: Right;  CDE: 6.74   CHOLECYSTECTOMY     CHOLECYSTECTOMY     COLONOSCOPY  12/10/2008   Dr. Gala Romney- L side diverticula, adenomatous polyp, hyperplastic polyp   COLONOSCOPY  11/16/2003   Dr. Laural Golden- performed exam to cecum,3 polyps- no path report available, external hemorrhoids,    COLONOSCOPY N/A 01/04/2014   Procedure: COLONOSCOPY;  Surgeon: Daneil Dolin, MD;  Location: [facility];  Service: Endoscopy;  Laterality: N/A;  8:30am   COLONOSCOPY N/A 02/20/2017   Surgeon: Daneil Dolin, MD; multiple colon polyps ranging 3 to 9 mm in size.  Pathology with tubular adenomas.  Recommended repeat in 3 years.   COLONOSCOPY WITH PROPOFOL N/A 10/03/2020   Procedure: COLONOSCOPY WITH PROPOFOL;  Surgeon: Daneil Dolin, MD;  Location: [facility];  Service: Endoscopy;  Laterality: N/A;  8:30am   ENDARTERECTOMY Left 11/20/2021   Procedure: LEFT CAROTID ENDARTERECTOMY;  Surgeon: Broadus John, MD;  Location: [facility];  Service: Vascular;  Laterality: Left;  ESOPHAGOGASTRODUODENOSCOPY  08/31/2011   [id number] esophageal erosions consistent with mild erosive reflux esophagitis   MOUTH SURGERY     POLYPECTOMY  10/03/2020   Procedure: POLYPECTOMY;  Surgeon: Daneil Dolin, MD;  Location: [facility];  Service: Endoscopy;;     Home Medications:  Prior to Admission medications   Medication Sig Start Date End Date Taking? Authorizing Provider  acetaminophen (TYLENOL) 325 MG tablet Take 650 mg by mouth every 6 (six) hours as needed for moderate pain.    [provider]  albuterol (VENTOLIN HFA) 108 (90 Base) MCG/ACT inhaler TAKE 2 PUFFS BY MOUTH  EVERY 6 HOURS AS NEEDED FOR WHEEZE OR SHORTNESS OF BREATH 11/02/21   Cook, Jayce G, DO  Alirocumab (PRALUENT) 150 MG/ML SOAJ INJECT 1 PEN INTO THE SKIN EVERY 14 (FOURTEEN) DAYS. 01/09/22   Josue Hector, MD  Ascorbic Acid (VITAMIN C) 1000 MG tablet Take 1,000 mg by mouth 3 (three) times a week.    [provider]  b complex vitamins capsule Take 1 capsule by mouth once a week.    [provider]  Cholecalciferol (VITAMIN D3) 125 MCG (5000 UT) CAPS Take 5,000 Units by mouth 2 (two) times a week.    [provider]  clopidogrel (PLAVIX) 75 MG tablet TAKE 1 TABLET BY MOUTH EVERY DAY 02/16/22   Waynetta Sandy, MD  diazepam (VALIUM) 2 MG tablet TAKE ONE TABLET (2MG  TOTAL) BY MOUTH DAILY AS NEEDED FOR DIZZINESS SECONDARY TO MENIERE'S DISEASE 02/13/22   Coral Spikes, DO  diclofenac Sodium (VOLTAREN) 1 % GEL Apply 2 g topically daily as needed (pain). 09/29/21   [provider]  DULoxetine (CYMBALTA) 60 MG capsule Take 1 capsule (60 mg total) by mouth daily. 05/11/21   Coral Spikes, DO  glipiZIDE (GLUCOTROL) 5 MG tablet Take 1 tablet (5 mg total) by mouth 2 (two) times daily before a meal. 01/10/22   Brita Romp, NP  hydrALAZINE (APRESOLINE) 25 MG tablet Take 50 mg by mouth 3 (three) times daily. 10/17/21   [provider]  levothyroxine (SYNTHROID) 25 MCG tablet TAKE ONE TABLET BY MOUTH DAILY FOR HYPOTHYROIDISM 04/12/21   [provider]  Multiple Vitamin (MULTIVITAMIN WITH MINERALS) TABS Take 1 tablet by mouth daily.    [provider]  pantoprazole (PROTONIX) 40 MG tablet TAKE 1 TABLET BY MOUTH EVERY DAY 12/30/20   Coral Spikes, DO  primidone (MYSOLINE) 50 MG tablet Take 1 tablet (50 mg total) by mouth 2 (two) times daily. 03/26/22   Tat, Eustace Quail, DO  sodium chloride (OCEAN) 0.65 % SOLN nasal spray Place 1 spray into both nostrils at bedtime.    [provider]  TIADYLT ER 360 MG 24 hr capsule TAKE 1 CAPSULE BY MOUTH  EVERY DAY 01/02/21   Coral Spikes, DO  traMADol (ULTRAM) 50 MG tablet Take 1 tablet (50 mg total) by mouth every 6 (six) hours as needed. 11/21/21 11/21/22  Dagoberto Ligas, PA-C    Inpatient Medications: Scheduled Meds:  Chlorhexidine Gluconate Cloth  6 each Topical Q0600   clopidogrel  75 mg Oral Daily   DULoxetine  60 mg Oral Daily   heparin  5,000 Units Subcutaneous Q8H   hydrALAZINE  50 mg Oral TID   insulin aspart  0-5 Units Subcutaneous QHS   insulin aspart  0-9 Units Subcutaneous TID WC   insulin aspart  3 Units Subcutaneous TID WC   insulin glargine-yfgn  5 Units Subcutaneous QHS  levothyroxine  25 mcg Oral Q0600   pantoprazole  40 mg Oral QHS   primidone  50 mg Oral BID   sodium chloride  1 spray Each Nare QHS   Continuous Infusions:  sodium chloride 50 mL/hr at 04/09/22 0610   diltiazem (CARDIZEM) infusion 5 mg/hr (04/09/22 0606)   PRN Meds: acetaminophen **OR** acetaminophen, bisacodyl, diazepam, fentaNYL (SUBLIMAZE) injection, hydrALAZINE, ondansetron **OR** ondansetron (ZOFRAN) IV, oxyCODONE, traZODone  Allergies:    Allergies  Allergen Reactions   Atorvastatin Other (See Comments)    Joint and muscle aches, weakness present   Zetia [Ezetimibe] Other (See Comments)    Elevated liver enzymes   Zocor [Simvastatin] Other (See Comments)    Joint and muscle aches   Codeine Other (See Comments)    Passed out    Social History:   Social History   Socioeconomic History   Marital status: Married    Spouse name: Juliann Pulse   Number of children: 2   Years of education: Not on file   Highest education level: Not on file  Occupational History   Occupation: retired    Comment: Clinical biochemist  Tobacco Use   Smoking status: Former    Packs/day: 2.00    Years: 24.00    Additional pack years: 0.00    Total pack years: 48.00    Types: Cigarettes    Quit date: 1985    Years since quitting: 39.2   Smokeless tobacco: Never  Vaping Use   Vaping Use: Never used   Substance and Sexual Activity   Alcohol use: Yes    Comment: occasionally- 4-5 mixed drinks per week.   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  (10/26/2021)   Overall Financial Resource Strain (CARDIA)    Difficulty of Paying Living Expenses: Not hard at all  Food Insecurity: No Food Insecurity (04/08/2022)   Hunger Vital Sign    Worried About Running Out of Food in the Last Year: Never true    Ran Out of Food in the Last Year: Never true  Transportation Needs: No Transportation Needs (04/08/2022)   PRAPARE - Hydrologist (Medical): No    Lack of Transportation (Non-Medical): No  Physical Activity: Inactive (10/26/2021)   Exercise Vital Sign    Days of Exercise per Week: 0 days    Minutes of Exercise per Session: 0 min  Stress: No Stress Concern Present (10/26/2021)   [place]    Feeling of Stress : Not at all  Social Connections: Moderately Integrated (10/26/2021)   Social Connection and Isolation Panel [NHANES]    Frequency of Communication with Friends and Family: Twice a week    Frequency of Social Gatherings with Friends and Family: Twice a week    Attends Religious Services: More than 4 times per year    Active Member of [employer] or Organizations: No    Attends Archivist Meetings: Never    Marital Status: Married  Human resources officer Violence: Not At Risk (04/08/2022)   Humiliation, Afraid, Rape, and Kick questionnaire    Fear of Current or Ex-Partner: No    Emotionally Abused: No    Physically Abused: No    Sexually Abused: No    Family  History:     Family History  Problem Relation Age of Onset   Lung cancer Mother    Heart attack Father    Colon cancer Brother 37    Alzheimer's disease Brother    Alzheimer's disease Brother    Alzheimer's disease Brother    Ankylosing spondylitis Child    Thyroid nodules Child      ROS:  Please see the history of present illness.  Review of Systems  Constitutional: Negative.  HENT: Negative.    Cardiovascular:  Positive for chest pain, dyspnea on exertion and irregular heartbeat.  Respiratory:  Positive for shortness of breath.   Endocrine: Negative.   Hematologic/Lymphatic: Negative.   Musculoskeletal: Negative.   Gastrointestinal: Negative.   Genitourinary: Negative.   Neurological: Negative.     All other ROS reviewed and negative.     Physical Exam/Data:   Vitals:   04/09/22 0508 04/09/22 0530 04/09/22 0600 04/09/22 0739  BP:  (!) 172/54 (!) 158/44   Pulse:  71 67 (!) 122  Resp:  17 14 (!) 21  Temp: 99.3 F (37.4 C)   99.5 F (37.5 C)  TempSrc: Oral   Oral  SpO2:  95% 95% 95%  Weight: 93.3 kg     Height:        Intake/Output Summary (Last 24 hours) at 04/09/2022 0911 Last data filed at 04/09/2022 0800 Gross per 24 hour  Intake 1526.5 ml  Output 400 ml  Net 1126.5 ml      04/09/2022    5:08 AM 04/08/2022    5:30 PM 04/08/2022   10:47 AM  Last 3 Weights  Weight (lbs) 205 lb 11 oz 203 lb 0.7 oz 207 lb 3.7 oz  Weight (kg) 93.3 kg 92.1 kg 94 kg     Body mass index is 30.38 kg/m.  General:  Well nourished, well developed, in no acute distress  HEENT: normal Neck: no JVD Vascular: bilateral carotid bruits; Distal pulses 2+ bilaterally Cardiac:  normal S1, S2; RRR; 2/6 systolic murmur LSB Lungs:  decreased breath sounds bases Abd: soft, nontender, no hepatomegaly  Ext: no edema Musculoskeletal:  No deformities, BUE and BLE strength normal and equal Skin: warm and dry  Neuro:  CNs 2-12 intact, no focal abnormalities noted Psych:  Normal affect   EKG:  The EKG was personally reviewed and demonstrates:  NSR with ST elevation inf//lat Telemetry:  Telemetry was personally reviewed and  demonstrates:  NSR with frequent runs SVT  Relevant CV Studies:   Coronary calcium score 02/2020 ADDENDUM REPORT: 04/01/2020 15:41   CLINICAL DATA:  Risk stratification 77 year old male risk stratification   EXAM: Coronary Calcium Score   TECHNIQUE: The patient was scanned on a [employer]. Axial non-contrast 3 mm slices were carried out through the heart. The data set was analyzed on a dedicated work station and scored using the [place].   FINDINGS: Non-cardiac: See separate report from [facility].   Aorta: Normal size. Mild atherosclerosis aortic valve annulus, arch as well as descending aorta.   Pericardium: Normal   Coronary arteries: Normal origins. Mild RCA, LM and LAD calcifications.   IMPRESSION: 1. Coronary calcium score of 42. This was 77 percentile for age and sex matched control.   2.  Aortic atherosclerosis.   Laboratory Data:  High Sensitivity Troponin:   Recent Labs  Lab 04/08/22 1053 04/08/22 1305 04/08/22 2322 04/09/22 0304 04/09/22 0621  TROPONINIHS 34* 37* 28* 24* 23*     Chemistry Recent  Labs  Lab 04/08/22 1053 04/08/22 1507 04/09/22 0304  NA 131*  --  129*  K 4.1  --  4.0  CL 96*  --  98  CO2 24  --  23  GLUCOSE 161*  --  143*  BUN 26*  --  26*  CREATININE 1.37*  --  1.28*  CALCIUM 8.7*  --  8.1*  MG  --  1.8 2.7*  GFRNONAA 53*  --  57*  ANIONGAP 11  --  8    No results for input(s): "PROT", "ALBUMIN", "AST", "ALT", "ALKPHOS", "BILITOT" in the last 168 hours. Lipids  Recent Labs  Lab 04/09/22 0304  CHOL 128  TRIG 120  HDL 63  LDLCALC 41  CHOLHDL 2.0    Hematology Recent Labs  Lab 04/08/22 1053 04/09/22 0304  WBC 12.4* 9.8  RBC 4.13* 3.66*  HGB 12.6* 11.1*  HCT 37.2* 33.1*  MCV 90.1 90.4  MCH 30.5 30.3  MCHC 33.9 33.5  RDW 13.2 13.2  PLT 322 273   Thyroid  Recent Labs  Lab 04/08/22 1507  TSH 5.147*    BNP Recent Labs  Lab 04/09/22 0304  BNP 180.0*    DDimer No results  for input(s): "DDIMER" in the last 168 hours.   Radiology/Studies:  Portable chest 1 View  Result Date: 04/09/2022 CLINICAL DATA:  Shortness of breath. EXAM: PORTABLE CHEST 1 VIEW COMPARISON:  04/08/2022. FINDINGS: Heart is enlarged and the mediastinal contour is within normal limits. There is atherosclerotic calcification of the aorta. Lung volumes are low with mild atelectasis at the lung bases. No effusion or pneumothorax. No acute osseous abnormality. IMPRESSION: Low lung volumes with mild atelectasis at the lung bases. Electronically Signed   By: Brett Fairy M.D.   On: 04/09/2022 04:15   DG Chest Port 1 View  Result Date: 04/08/2022 CLINICAL DATA:  Rapid heart rate. Chest pain and shortness of breath. Dizziness and weakness. EXAM: PORTABLE CHEST 1 VIEW COMPARISON:  04/03/2012 FINDINGS: Heart size and mediastinal contours are stable. Aortic atherosclerosis. Lung volumes are low. Blunting of the left costophrenic angle noted, similar to previous exam. No signs of interstitial edema or airspace consolidation. IMPRESSION: 1. Low lung volumes. 2. Persistent blunting of the left costophrenic angle compatible with small effusion. Electronically Signed   By: Kerby Moors M.D.   On: 04/08/2022 11:41     Assessment and Plan:   SVT treated with adenosine and IV dilt but still having freq runs. Was given IM decadron on Thurs for bronchitis and using inhalers  Chest pain into shoulders and SOB in setting of SVT now resolved but some ST elevation on EKG-troponins flat, Coronary calcium score 42 02/2020. Will repeat EKG. Add metoprolol-one dose Iv 5mg  then 25 mg bid. Per Dr. Dellia Cloud stop hydralazine as it can cause reflex tachycardia. Await echo but may need to transfer [facility] cath.  HTN BP running high  HLD on Paluent  Left CEA 0000000 complicated by post op TIA on Plavix  CKD 3a,   DM2   Risk Assessment/Risk Scores:     TIMI Risk Score for Unstable Angina or Non-ST Elevation MI:    The patient's TIMI risk score is 4, which indicates a 20% risk of all cause mortality, new or recurrent myocardial infarction or need for urgent revascularization in the next 14 days.          For questions or updates, please contact [place] Please consult [email or web address] for contact info under  Sumner Boast, PA-C  04/09/2022 9:11 AM

## 2022-04-09 NOTE — Interval H&P Note (Signed)
History and Physical Interval Note:  04/09/2022 1:06 PM  Kyle Dunlap  has presented today for surgery, with the diagnosis of UNSTABLE ANGINA.  The various methods of treatment have been discussed with the patient and family. After consideration of risks, benefits and other options for treatment, the patient has consented to  Procedure(s): LEFT HEART CATH AND CORONARY ANGIOGRAPHY (N/A)  PERCUTANEOUS CORONARY INTERVENTION   as a surgical intervention.  The patient's history has been reviewed, patient examined, no change in status, stable for surgery.  I have reviewed the patient's chart and labs.  Questions were answered to the patient's satisfaction.    Cath Lab Visit (complete for each Cath Lab visit)  Clinical Evaluation Leading to the Procedure:   ACS: Yes.    Non-ACS:    Anginal Classification: CCS III  Anti-ischemic medical therapy: Minimal Therapy (1 class of medications)  Non-Invasive Test Results: No non-invasive testing performed  Prior CABG: No previous CABG   Glenetta Hew

## 2022-04-09 NOTE — Progress Notes (Signed)
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  Noted to be in symptomatic SVT in ED and treated with adenosine, subsequently placed on IV Cardizem infusion.  His heart rate started to improve after this.  His high-sensitivity troponin tests were 34>37.  CXR suggested bronchitis but no pulmonary edema or pneumonia seen.  Admitted to stepdown ICU for further management and cardiology consulted.  Of note, patient has a history of tachyarrhythmia and is treated with daily diltiazem 360 mg.  He receives care with the [facility] and locally with Dr. Lenise Arena [facility].  Problem List:   Principal Problem:   Paroxysmal SVT (supraventricular tachycardia) Active Problems:   GERD (gastroesophageal reflux disease)   Hyperlipidemia LDL goal <70   Essential hypertension, benign   Meniere's disease   Aortic atherosclerosis (HCC)   Statin myopathy   Type 2 diabetes mellitus with diabetic nephropathy, without long-term current use of insulin (HCC)   Stage 3a chronic kidney disease (HCC)   Diabetic neuropathy (HCC)   Generalized anxiety disorder   Hypothyroidism   Carotid artery stenosis   Cogwheel rigidity   SOB (shortness of breath)   Intention tremor   Hyponatremia   Leukocytosis   Anemia, unspecified   Chest  discomfort   PVD (peripheral vascular disease) (HCC)  Assessment and Plan:  Unstable angina Per cardiology, patient requires transfer to [facility] for cath in setting of EKG findings and presentation.  Tachyarrhythmias with EKG evidence of SVT Presented with symptomatic SVT HR 160s.  UDS positive for BZA only - f/u TTE - Continue IV diltiazem started in ED, now admitted to stepdown ICU - Hold home oral diltiazem 360 mg   Uncontrolled type 2 diabetes mellitus with renal, vascular and neurological complications CBGs relatively well controlled so far this admission, goal <180.  Poor control outpatient evidenced by an A1c>7%. - Follows with [facility]; failed SGLT 2/2 worsening renal function; considering GLP1 - Semglee 5 units daily with prandial novolog 3 units TID with meals>50% eatent - SSI and CBGs 5x/day - lipid panel, TSH, 25-OH vitamin D pending - holding home glipizide while inpatient  Hyponatremia Na 129. - CTM.  Leukocytosis  He was recently diagnosed with acute bronchitis last week seen at the local urgent care and treated with intramuscular Decadron and albuterol inhaler. - secondary to IM decadron injection given on 3/14 at the local urgent care  - recheck CBC/diff in AM    Anemia, Unspecified - he is followed by Dr. Gala Romney from GI service - he has a history of hyperplastic colon polyps    PVD s/p left CEA - he is on clopidogrel 75 mg daily since procedure - continue daily clopidogrel for now   PTSD with GAD - resume home cymbalta treatment  - diazepam ordered PRN    Symptomatic diabetic neuropathy  - resume home duloxetine 60 mg    Intention Tremor - started on primidone 50 mg BID  by his neurologist    GERD - pantoprazole 40 mg daily for GI protection    Meniere's Disease - diazepam 2 mg tablets PRN symptoms    Hypothyroidism  TSH markedly elevated to 5.147 this admission. - Free T4 ordered - resume home dose levothyroxine    Hyperlipidemia/diabetic dyslipidemia Patient is reportedly intolerant to statins and ezetimibe.  LDL 41 this admission.  Stage 3a CKD - renal function stable from recent tests - renally dose medications as appropriate   DVT prophylaxis: Anderson heparin Code Status: Full Family Communication: Wife at bedside  Disposition Plan: Patient is from: Home Anticipated d/c is to: Pending PT eval Anticipated d/c date is: Pending clinical improvement Patient currently: Pending cardiology workup  Consultants:  Cardiology  Procedures:  TTE pending  Antimicrobials:  None  Subjective: Patient seen and evaluated today with no new acute complaints or concerns, he is not reporting any active chest pain, does endorse shortness of breath.  Objective: Vitals:   04/09/22 0500 04/09/22 0508 04/09/22 0530 04/09/22 0600  BP: (!) 164/51  (!) 172/54 (!) 158/44  Pulse: 76  71 67  Resp: 18  17 14   Temp:  99.3 F (37.4 C)    TempSrc:  Oral    SpO2: 93%  95% 95%  Weight:  93.3 kg    Height:        Intake/Output Summary (Last 24 hours) at 04/09/2022 0730 Last data filed at 04/09/2022 0513 Gross per 24 hour  Intake 667.49 ml  Output 400 ml  Net 267.49 ml   Filed Weights   04/08/22 1047 04/08/22 1730 04/09/22 0508  Weight: 94 kg 92.1 kg 93.3 kg    Examination: General: Elderly obese man resting in bed in no acute distress. HEENT: MMM, NCAT. Lungs: Normal WOB on room air.  CTAB. Cardiovascular: Irregular rhythm, regular rate.  Normal S1/S2.  No murmurs/rubs/gallops.  Intermittent SVT on telemetry.  No JVD. Abdomen: Soft, nondistended. Extremities: Trace bipedal edema.  No clubbing, cyanosis. Skin: Warm, dry.  No rashes grossly. Neuro: Alert and oriented x 4.  No focal neurological deficit.  Data Reviewed: I have personally reviewed following labs and imaging studies.  CBC: Recent Labs  Lab 04/08/22 1053 04/09/22 0304  WBC 12.4* 9.8  NEUTROABS 7.9* 7.0  HGB 12.6* 11.1*  HCT 37.2*  33.1*  MCV 90.1 90.4  PLT 322 [id number]   Basic Metabolic Panel: Recent Labs  Lab 04/08/22 1053 04/08/22 1507 04/09/22 0304  NA 131*  --  129*  K 4.1  --  4.0  CL 96*  --  98  CO2 24  --  23  GLUCOSE 161*  --  143*  BUN 26*  --  26*  CREATININE 1.37*  --  1.28*  CALCIUM 8.7*  --  8.1*  MG  --  1.8 2.7*   GFR: Estimated Creatinine Clearance: 53.6 mL/min (A) (by C-G formula based on SCr of 1.28 mg/dL (H)). Liver Function Tests: No results for input(s): "AST", "ALT", "ALKPHOS", "BILITOT", "PROT", "ALBUMIN" in the last 168 hours. No results for input(s): "LIPASE", "AMYLASE" in the last 168 hours. No results for input(s): "AMMONIA" in the last 168 hours. Coagulation Profile: No results for input(s): "INR", "PROTIME" in the last 168 hours. Cardiac Enzymes: No results for input(s): "CKTOTAL", "CKMB", "CKMBINDEX", "TROPONINI" in the last 168 hours. BNP (last 3 results) No results for input(s): "PROBNP" in the last 8760 hours. HbA1C: No results for input(s): "HGBA1C" in the last 72 hours. CBG: Recent Labs  Lab 04/08/22  1728 04/08/22 2130 04/09/22 0307  GLUCAP 111* 205* 125*   Lipid Profile: Recent Labs    04/09/22 0304  CHOL 128  HDL 63  LDLCALC 41  TRIG 120  CHOLHDL 2.0   Thyroid Function Tests: Recent Labs    04/08/22 1507  TSH 5.147*   Anemia Panel: No results for input(s): "VITAMINB12", "FOLATE", "FERRITIN", "TIBC", "IRON", "RETICCTPCT" in the last 72 hours. Sepsis Labs: No results for input(s): "PROCALCITON", "LATICACIDVEN" in the last 168 hours.  Recent Results (from the past 240 hour(s))  MRSA Next Gen by PCR, Nasal     Status: None   Collection Time: 04/08/22  6:00 PM   Specimen: Nasal Mucosa; Nasal Swab  Result Value Ref Range Status   MRSA by PCR Next Gen NOT DETECTED NOT DETECTED Final    Comment: (NOTE) The GeneXpert MRSA Assay (FDA approved for NASAL specimens only), is one component of a comprehensive MRSA colonization surveillance program. It is  not intended to diagnose MRSA infection nor to guide or monitor treatment for MRSA infections. Test performance is not FDA approved in patients less than 20 years old. Performed at [facility], [address] [place], [place] [address]      Radiology Studies: Portable chest 1 View  Result Date: 04/09/2022 CLINICAL DATA:  Shortness of breath. EXAM: PORTABLE CHEST 1 VIEW COMPARISON:  04/08/2022. FINDINGS: Heart is enlarged and the mediastinal contour is within normal limits. There is atherosclerotic calcification of the aorta. Lung volumes are low with mild atelectasis at the lung bases. No effusion or pneumothorax. No acute osseous abnormality. IMPRESSION: Low lung volumes with mild atelectasis at the lung bases. Electronically Signed   By: Brett Fairy M.D.   On: 04/09/2022 04:15   DG Chest Port 1 View  Result Date: 04/08/2022 CLINICAL DATA:  Rapid heart rate. Chest pain and shortness of breath. Dizziness and weakness. EXAM: PORTABLE CHEST 1 VIEW COMPARISON:  04/03/2012 FINDINGS: Heart size and mediastinal contours are stable. Aortic atherosclerosis. Lung volumes are low. Blunting of the left costophrenic angle noted, similar to previous exam. No signs of interstitial edema or airspace consolidation. IMPRESSION: 1. Low lung volumes. 2. Persistent blunting of the left costophrenic angle compatible with small effusion. Electronically Signed   By: Kerby Moors M.D.   On: 04/08/2022 11:41    Scheduled Meds:  Chlorhexidine Gluconate Cloth  6 each Topical Q0600   clopidogrel  75 mg Oral Daily   DULoxetine  60 mg Oral Daily   heparin  5,000 Units Subcutaneous Q8H   hydrALAZINE  50 mg Oral TID   insulin aspart  0-5 Units Subcutaneous QHS   insulin aspart  0-9 Units Subcutaneous TID WC   insulin aspart  3 Units Subcutaneous TID WC   insulin glargine-yfgn  5 Units Subcutaneous QHS   levothyroxine  25 mcg Oral Q0600   pantoprazole  40 mg Oral QHS   primidone  50 mg Oral BID   sodium chloride  1  spray Each Nare QHS   Continuous Infusions:  sodium chloride 50 mL/hr at 04/09/22 0610   diltiazem (CARDIZEM) infusion 5 mg/hr (04/09/22 0606)     LOS: 0 days   Time spent: 35 minutes  Jaiah Weigel, MS4 [facility]  If 7PM-7AM, please contact night-coverage [email or web address] 04/09/2022, 7:30 AM

## 2022-04-09 NOTE — Progress Notes (Signed)
CRITICAL VALUE STICKER  CRITICAL VALUE: EKG :Acute STEMI  RECEIVER (on-site recipient of call):  DATE & TIME NOTIFIED:   MESSENGER (representative from lab):  MD NOTIFIED: Dr. Nevada Crane  TIME OF NOTIFICATION:05;10  RESPONSE:  awaiting  Patient is not complaining of chest pain, asymptomatic, troponin this AM is 24 from 28

## 2022-04-09 NOTE — Plan of Care (Signed)
  Problem: Education: Goal: Knowledge of General Education information will improve Description Including pain rating scale, medication(s)/side effects and non-pharmacologic comfort measures Outcome: Progressing   Problem: Health Behavior/Discharge Planning: Goal: Ability to manage health-related needs will improve Outcome: Progressing   

## 2022-04-09 NOTE — TOC Progression Note (Signed)
  Transition of Care [facility]) Screening Note   Patient Details  Name: Kyle Dunlap Date of Birth: 01/27/1945   [facility]) CM/SW Contact:    Boneta Lucks, RN Phone Number: 04/09/2022, 1:00 PM    Transition of Care Department [facility]) has reviewed patient and no TOC needs have been identified at this time. We will continue to monitor patient advancement through interdisciplinary progression rounds. If new patient transition needs arise, please place a TOC consult.     Barriers to Discharge: Continued Medical Work up (transported to [employer])  Expected Discharge Plan and [place] arrangements for the past 2 months: [place] Determinants of Health (SDOH) Interventions [place]: No Food Insecurity (04/08/2022)  Housing: Low Risk  (04/08/2022)  Transportation Needs: No Transportation Needs (04/08/2022)  Utilities: Not At Risk (04/08/2022)  Alcohol Screen: Low Risk  (10/26/2021)  Depression (PHQ2-9): Low Risk  (03/02/2022)  Financial Resource Strain: Low Risk  (10/26/2021)  Physical Activity: Inactive (10/26/2021)  Social Connections: Moderately Integrated (10/26/2021)  Stress: No Stress Concern Present (10/26/2021)  Tobacco Use: Medium Risk (04/08/2022)    Readmission Risk Interventions     No data to display

## 2022-04-09 NOTE — Consult Note (Signed)
Cardiology Consultation   Patient ID: Kyle Dunlap MRN: [id number]; DOB: 29-Oct-1945  Admit date: 04/08/2022 Date of Consult: 04/09/2022  PCP:  Coral Spikes, [place] [facility] Cardiologist:  Jenkins Rouge, MD        Patient Profile:   Kyle Dunlap is a 77 y.o. 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 Wynetta Emery.  History of Present Illness:   Kyle Dunlap with above PMhx came to the ED with chest pressure into his shoulders and shortness of breath that awakened him from sleep. He was found to be in SVT and slowed down with adenosine and diltiazem but still having frequent runs SVT. Chest pain resolved-never had before but has some ST elevation inf/lat on EKG. Patient has been on diltiazem for years for palpitations.  Coronary calcium score 42 03/2020. . Troponins flat 28,24,23, BNP 180, Mg 2.7(1.8 yest) Na 129, crt 1.28. was seen in urgent care Thurs for cough and told he had bronchitis. Was taking albuterol inhaler and given IM decadron plus Corcidin HBP.     Past Medical History:  Diagnosis Date   Adenomatous colon polyp 11/22/2008   Dr. Gala Romney   Allergy    Carotid artery stenosis    Chronic back pain    CKD (chronic kidney disease)    DDD (degenerative disc disease), lumbar    GERD (gastroesophageal reflux disease)    Heart murmur 2023   Hemorrhoids, external    High cholesterol    History of kidney stones 2013   HTN (hypertension)    Hyperplastic colon polyp 11/22/2008   Dr. Gala Romney   Hypothyroidism    Kidney stone    "14 years ago" per pt   Meniere's disease    Meniere's disease    Pneumonia 1965   PTSD (post-traumatic stress disorder)    Tachyarrhythmia    Type 2 diabetes mellitus ([place])     Past Surgical History:  Procedure Laterality Date   CATARACT EXTRACTION W/PHACO Left 09/28/2016   Procedure: CATARACT EXTRACTION PHACO AND  INTRAOCULAR LENS PLACEMENT ([place]);  Surgeon: Baruch Goldmann, MD;  Location: [facility];  Service: Ophthalmology;  Laterality: Left;  CDE: 4.40   CATARACT EXTRACTION W/PHACO Right 10/26/2016   Procedure: CATARACT EXTRACTION PHACO AND INTRAOCULAR LENS PLACEMENT RIGHT EYE;  Surgeon: Baruch Goldmann, MD;  Location: [facility];  Service: Ophthalmology;  Laterality: Right;  CDE: 6.74   CHOLECYSTECTOMY     CHOLECYSTECTOMY     COLONOSCOPY  12/10/2008   Dr. Gala Romney- L side diverticula, adenomatous polyp, hyperplastic polyp   COLONOSCOPY  11/16/2003   Dr. Laural Golden- performed exam to cecum,3 polyps- no path report available, external hemorrhoids,    COLONOSCOPY N/A 01/04/2014   Procedure: COLONOSCOPY;  Surgeon: Daneil Dolin, MD;  Location: [facility];  Service: Endoscopy;  Laterality: N/A;  8:30am   COLONOSCOPY N/A 02/20/2017   Surgeon: Daneil Dolin, MD; multiple colon polyps ranging 3 to 9 mm in size.  Pathology with tubular adenomas.  Recommended repeat in 3 years.   COLONOSCOPY WITH PROPOFOL N/A 10/03/2020   Procedure: COLONOSCOPY WITH PROPOFOL;  Surgeon: Daneil Dolin, MD;  Location: [facility];  Service: Endoscopy;  Laterality: N/A;  8:30am   ENDARTERECTOMY Left 11/20/2021   Procedure: LEFT CAROTID ENDARTERECTOMY;  Surgeon: Broadus John, MD;  Location: [facility];  Service: Vascular;  Laterality: Left;  ESOPHAGOGASTRODUODENOSCOPY  08/31/2011   [id number] esophageal erosions consistent with mild erosive reflux esophagitis   MOUTH SURGERY     POLYPECTOMY  10/03/2020   Procedure: POLYPECTOMY;  Surgeon: Daneil Dolin, MD;  Location: [facility];  Service: Endoscopy;;     Home Medications:  Prior to Admission medications   Medication Sig Start Date End Date Taking? Authorizing Provider  acetaminophen (TYLENOL) 325 MG tablet Take 650 mg by mouth every 6 (six) hours as needed for moderate pain.    [provider]  albuterol (VENTOLIN HFA) 108 (90 Base) MCG/ACT inhaler TAKE 2 PUFFS BY MOUTH  EVERY 6 HOURS AS NEEDED FOR WHEEZE OR SHORTNESS OF BREATH 11/02/21   Cook, Jayce G, DO  Alirocumab (PRALUENT) 150 MG/ML SOAJ INJECT 1 PEN INTO THE SKIN EVERY 14 (FOURTEEN) DAYS. 01/09/22   Josue Hector, MD  Ascorbic Acid (VITAMIN C) 1000 MG tablet Take 1,000 mg by mouth 3 (three) times a week.    [provider]  b complex vitamins capsule Take 1 capsule by mouth once a week.    [provider]  Cholecalciferol (VITAMIN D3) 125 MCG (5000 UT) CAPS Take 5,000 Units by mouth 2 (two) times a week.    [provider]  clopidogrel (PLAVIX) 75 MG tablet TAKE 1 TABLET BY MOUTH EVERY DAY 02/16/22   Waynetta Sandy, MD  diazepam (VALIUM) 2 MG tablet TAKE ONE TABLET (2MG  TOTAL) BY MOUTH DAILY AS NEEDED FOR DIZZINESS SECONDARY TO MENIERE'S DISEASE 02/13/22   Coral Spikes, DO  diclofenac Sodium (VOLTAREN) 1 % GEL Apply 2 g topically daily as needed (pain). 09/29/21   [provider]  DULoxetine (CYMBALTA) 60 MG capsule Take 1 capsule (60 mg total) by mouth daily. 05/11/21   Coral Spikes, DO  glipiZIDE (GLUCOTROL) 5 MG tablet Take 1 tablet (5 mg total) by mouth 2 (two) times daily before a meal. 01/10/22   Brita Romp, NP  hydrALAZINE (APRESOLINE) 25 MG tablet Take 50 mg by mouth 3 (three) times daily. 10/17/21   [provider]  levothyroxine (SYNTHROID) 25 MCG tablet TAKE ONE TABLET BY MOUTH DAILY FOR HYPOTHYROIDISM 04/12/21   [provider]  Multiple Vitamin (MULTIVITAMIN WITH MINERALS) TABS Take 1 tablet by mouth daily.    [provider]  pantoprazole (PROTONIX) 40 MG tablet TAKE 1 TABLET BY MOUTH EVERY DAY 12/30/20   Coral Spikes, DO  primidone (MYSOLINE) 50 MG tablet Take 1 tablet (50 mg total) by mouth 2 (two) times daily. 03/26/22   Tat, Eustace Quail, DO  sodium chloride (OCEAN) 0.65 % SOLN nasal spray Place 1 spray into both nostrils at bedtime.    [provider]  TIADYLT ER 360 MG 24 hr capsule TAKE 1 CAPSULE BY MOUTH  EVERY DAY 01/02/21   Coral Spikes, DO  traMADol (ULTRAM) 50 MG tablet Take 1 tablet (50 mg total) by mouth every 6 (six) hours as needed. 11/21/21 11/21/22  Dagoberto Ligas, PA-C    Inpatient Medications: Scheduled Meds:  Chlorhexidine Gluconate Cloth  6 each Topical Q0600   clopidogrel  75 mg Oral Daily   DULoxetine  60 mg Oral Daily   heparin  5,000 Units Subcutaneous Q8H   hydrALAZINE  50 mg Oral TID   insulin aspart  0-5 Units Subcutaneous QHS   insulin aspart  0-9 Units Subcutaneous TID WC   insulin aspart  3 Units Subcutaneous TID WC   insulin glargine-yfgn  5 Units Subcutaneous QHS  levothyroxine  25 mcg Oral Q0600   pantoprazole  40 mg Oral QHS   primidone  50 mg Oral BID   sodium chloride  1 spray Each Nare QHS   Continuous Infusions:  sodium chloride 50 mL/hr at 04/09/22 0610   diltiazem (CARDIZEM) infusion 5 mg/hr (04/09/22 0606)   PRN Meds: acetaminophen **OR** acetaminophen, bisacodyl, diazepam, fentaNYL (SUBLIMAZE) injection, hydrALAZINE, ondansetron **OR** ondansetron (ZOFRAN) IV, oxyCODONE, traZODone  Allergies:    Allergies  Allergen Reactions   Atorvastatin Other (See Comments)    Joint and muscle aches, weakness present   Zetia [Ezetimibe] Other (See Comments)    Elevated liver enzymes   Zocor [Simvastatin] Other (See Comments)    Joint and muscle aches   Codeine Other (See Comments)    Passed out    Social History:   Social History   Socioeconomic History   Marital status: Married    Spouse name: Juliann Pulse   Number of children: 2   Years of education: Not on file   Highest education level: Not on file  Occupational History   Occupation: retired    Comment: Clinical biochemist  Tobacco Use   Smoking status: Former    Packs/day: 2.00    Years: 24.00    Additional pack years: 0.00    Total pack years: 48.00    Types: Cigarettes    Quit date: 1985    Years since quitting: 39.2   Smokeless tobacco: Never  Vaping Use   Vaping Use: Never used   Substance and Sexual Activity   Alcohol use: Yes    Comment: occasionally- 4-5 mixed drinks per week.   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  (10/26/2021)   Overall Financial Resource Strain (CARDIA)    Difficulty of Paying Living Expenses: Not hard at all  Food Insecurity: No Food Insecurity (04/08/2022)   Hunger Vital Sign    Worried About Running Out of Food in the Last Year: Never true    Ran Out of Food in the Last Year: Never true  Transportation Needs: No Transportation Needs (04/08/2022)   PRAPARE - Hydrologist (Medical): No    Lack of Transportation (Non-Medical): No  Physical Activity: Inactive (10/26/2021)   Exercise Vital Sign    Days of Exercise per Week: 0 days    Minutes of Exercise per Session: 0 min  Stress: No Stress Concern Present (10/26/2021)   [place]    Feeling of Stress : Not at all  Social Connections: Moderately Integrated (10/26/2021)   Social Connection and Isolation Panel [NHANES]    Frequency of Communication with Friends and Family: Twice a week    Frequency of Social Gatherings with Friends and Family: Twice a week    Attends Religious Services: More than 4 times per year    Active Member of [employer] or Organizations: No    Attends Archivist Meetings: Never    Marital Status: Married  Human resources officer Violence: Not At Risk (04/08/2022)   Humiliation, Afraid, Rape, and Kick questionnaire    Fear of Current or Ex-Partner: No    Emotionally Abused: No    Physically Abused: No    Sexually Abused: No    Family  History:     Family History  Problem Relation Age of Onset   Lung cancer Mother    Heart attack Father    Colon cancer Brother 51    Alzheimer's disease Brother    Alzheimer's disease Brother    Alzheimer's disease Brother    Ankylosing spondylitis Child    Thyroid nodules Child      ROS:  Please see the history of present illness.  Review of Systems  Constitutional: Negative.  HENT: Negative.    Cardiovascular:  Positive for chest pain, dyspnea on exertion and irregular heartbeat.  Respiratory:  Positive for shortness of breath.   Endocrine: Negative.   Hematologic/Lymphatic: Negative.   Musculoskeletal: Negative.   Gastrointestinal: Negative.   Genitourinary: Negative.   Neurological: Negative.     All other ROS reviewed and negative.     Physical Exam/Data:   Vitals:   04/09/22 0508 04/09/22 0530 04/09/22 0600 04/09/22 0739  BP:  (!) 172/54 (!) 158/44   Pulse:  71 67 (!) 122  Resp:  17 14 (!) 21  Temp: 99.3 F (37.4 C)   99.5 F (37.5 C)  TempSrc: Oral   Oral  SpO2:  95% 95% 95%  Weight: 93.3 kg     Height:        Intake/Output Summary (Last 24 hours) at 04/09/2022 0911 Last data filed at 04/09/2022 0800 Gross per 24 hour  Intake 1526.5 ml  Output 400 ml  Net 1126.5 ml      04/09/2022    5:08 AM 04/08/2022    5:30 PM 04/08/2022   10:47 AM  Last 3 Weights  Weight (lbs) 205 lb 11 oz 203 lb 0.7 oz 207 lb 3.7 oz  Weight (kg) 93.3 kg 92.1 kg 94 kg     Body mass index is 30.38 kg/m.  General:  Well nourished, well developed, in no acute distress  HEENT: normal Neck: no JVD Vascular: bilateral carotid bruits; Distal pulses 2+ bilaterally Cardiac:  normal S1, S2; RRR; 2/6 systolic murmur LSB Lungs:  decreased breath sounds bases Abd: soft, nontender, no hepatomegaly  Ext: no edema Musculoskeletal:  No deformities, BUE and BLE strength normal and equal Skin: warm and dry  Neuro:  CNs 2-12 intact, no focal abnormalities noted Psych:  Normal affect   EKG:  The EKG was personally reviewed and demonstrates:  NSR with ST elevation inf//lat Telemetry:  Telemetry was personally reviewed and  demonstrates:  NSR with frequent runs SVT  Relevant CV Studies:   Coronary calcium score 02/2020 ADDENDUM REPORT: 04/01/2020 15:41   CLINICAL DATA:  Risk stratification 77 year old male risk stratification   EXAM: Coronary Calcium Score   TECHNIQUE: The patient was scanned on a [employer]. Axial non-contrast 3 mm slices were carried out through the heart. The data set was analyzed on a dedicated work station and scored using the [place].   FINDINGS: Non-cardiac: See separate report from [facility].   Aorta: Normal size. Mild atherosclerosis aortic valve annulus, arch as well as descending aorta.   Pericardium: Normal   Coronary arteries: Normal origins. Mild RCA, LM and LAD calcifications.   IMPRESSION: 1. Coronary calcium score of 42. This was 58 percentile for age and sex matched control.   2.  Aortic atherosclerosis.   Laboratory Data:  High Sensitivity Troponin:   Recent Labs  Lab 04/08/22 1053 04/08/22 1305 04/08/22 2322 04/09/22 0304 04/09/22 0621  TROPONINIHS 34* 37* 28* 24* 23*     Chemistry Recent  Labs  Lab 04/08/22 1053 04/08/22 1507 04/09/22 0304  NA 131*  --  129*  K 4.1  --  4.0  CL 96*  --  98  CO2 24  --  23  GLUCOSE 161*  --  143*  BUN 26*  --  26*  CREATININE 1.37*  --  1.28*  CALCIUM 8.7*  --  8.1*  MG  --  1.8 2.7*  GFRNONAA 53*  --  57*  ANIONGAP 11  --  8    No results for input(s): "PROT", "ALBUMIN", "AST", "ALT", "ALKPHOS", "BILITOT" in the last 168 hours. Lipids  Recent Labs  Lab 04/09/22 0304  CHOL 128  TRIG 120  HDL 63  LDLCALC 41  CHOLHDL 2.0    Hematology Recent Labs  Lab 04/08/22 1053 04/09/22 0304  WBC 12.4* 9.8  RBC 4.13* 3.66*  HGB 12.6* 11.1*  HCT 37.2* 33.1*  MCV 90.1 90.4  MCH 30.5 30.3  MCHC 33.9 33.5  RDW 13.2 13.2  PLT 322 273   Thyroid  Recent Labs  Lab 04/08/22 1507  TSH 5.147*    BNP Recent Labs  Lab 04/09/22 0304  BNP 180.0*    DDimer No results  for input(s): "DDIMER" in the last 168 hours.   Radiology/Studies:  Portable chest 1 View  Result Date: 04/09/2022 CLINICAL DATA:  Shortness of breath. EXAM: PORTABLE CHEST 1 VIEW COMPARISON:  04/08/2022. FINDINGS: Heart is enlarged and the mediastinal contour is within normal limits. There is atherosclerotic calcification of the aorta. Lung volumes are low with mild atelectasis at the lung bases. No effusion or pneumothorax. No acute osseous abnormality. IMPRESSION: Low lung volumes with mild atelectasis at the lung bases. Electronically Signed   By: Brett Fairy M.D.   On: 04/09/2022 04:15   DG Chest Port 1 View  Result Date: 04/08/2022 CLINICAL DATA:  Rapid heart rate. Chest pain and shortness of breath. Dizziness and weakness. EXAM: PORTABLE CHEST 1 VIEW COMPARISON:  04/03/2012 FINDINGS: Heart size and mediastinal contours are stable. Aortic atherosclerosis. Lung volumes are low. Blunting of the left costophrenic angle noted, similar to previous exam. No signs of interstitial edema or airspace consolidation. IMPRESSION: 1. Low lung volumes. 2. Persistent blunting of the left costophrenic angle compatible with small effusion. Electronically Signed   By: Kerby Moors M.D.   On: 04/08/2022 11:41     Assessment and Plan:   SVT treated with adenosine and IV dilt but still having freq runs. Was given IM decadron on Thurs for bronchitis and using inhalers  Chest pain into shoulders and SOB in setting of SVT now resolved but some ST elevation on EKG-troponins flat, Coronary calcium score 42 02/2020. Will repeat EKG. Add metoprolol-one dose Iv 5mg  then 25 mg bid. Per Dr. Dellia Cloud stop hydralazine as it can cause reflex tachycardia. Await echo but may need to transfer to [facility] for cath.  HTN BP running high  HLD on Paluent  Left CEA 0000000 complicated by post op TIA on Plavix  CKD 3a,   DM2   Risk Assessment/Risk Scores:     TIMI Risk Score for Unstable Angina or Non-ST Elevation MI:    The patient's TIMI risk score is 4, which indicates a 20% risk of all cause mortality, new or recurrent myocardial infarction or need for urgent revascularization in the next 14 days.          For questions or updates, please contact [place] Please consult [email or web address] for contact info under  Sumner Boast, PA-C  04/09/2022 9:11 AM

## 2022-04-09 NOTE — Progress Notes (Signed)
Abnormal 12 lead EKG with ST T elevation involving inferior leads.  The patient is asymptomatic.  High sensitivity troponins are down trending 24 from 26.  2D echo ordered and pending.    Repeat Trops and 12 lead EKG.

## 2022-04-09 NOTE — Progress Notes (Signed)
Patient transferred care link, report called to [facility] cath lab at [facility]. Patient family at bedside and aware of transfer.  Eldo Umanzor, Tivis Ringer, RN

## 2022-04-09 NOTE — Progress Notes (Signed)
  Echocardiogram 2D Echocardiogram has been performed.  Kyle Dunlap 04/09/2022, 11:13 AM

## 2022-04-10 ENCOUNTER — Other Ambulatory Visit (HOSPITAL_COMMUNITY): Payer: Self-pay

## 2022-04-10 ENCOUNTER — Encounter (HOSPITAL_COMMUNITY): Payer: Self-pay | Admitting: Cardiology

## 2022-04-10 DIAGNOSIS — E785 Hyperlipidemia, unspecified: Secondary | ICD-10-CM | POA: Diagnosis not present

## 2022-04-10 DIAGNOSIS — I471 Supraventricular tachycardia, unspecified: Secondary | ICD-10-CM | POA: Diagnosis not present

## 2022-04-10 DIAGNOSIS — I10 Essential (primary) hypertension: Secondary | ICD-10-CM

## 2022-04-10 DIAGNOSIS — I4819 Other persistent atrial fibrillation: Secondary | ICD-10-CM

## 2022-04-10 LAB — GLUCOSE, CAPILLARY
Glucose-Capillary: 139 mg/dL — ABNORMAL HIGH (ref 70–99)
Glucose-Capillary: 154 mg/dL — ABNORMAL HIGH (ref 70–99)
Glucose-Capillary: 114 mg/dL — ABNORMAL HIGH (ref 70–99)
Glucose-Capillary: 125 mg/dL — ABNORMAL HIGH (ref 70–99)

## 2022-04-10 LAB — CBC WITH DIFFERENTIAL/PLATELET
Abs Immature Granulocytes: 0.02 10*3/uL (ref 0.00–0.07)
Basophils Absolute: 0 10*3/uL (ref 0.0–0.1)
Basophils Relative: 0 %
Eosinophils Absolute: 0.3 10*3/uL (ref 0.0–0.5)
Eosinophils Relative: 4 %
HCT: 27.9 % — ABNORMAL LOW (ref 39.0–52.0)
Hemoglobin: 9.5 g/dL — ABNORMAL LOW (ref 13.0–17.0)
Immature Granulocytes: 0 %
Lymphocytes Relative: 21 %
Lymphs Abs: 1.6 10*3/uL (ref 0.7–4.0)
MCH: 30.6 pg (ref 26.0–34.0)
MCHC: 34.1 g/dL (ref 30.0–36.0)
MCV: 90 fL (ref 80.0–100.0)
Monocytes Absolute: 0.8 10*3/uL (ref 0.1–1.0)
Monocytes Relative: 11 %
Neutro Abs: 4.8 10*3/uL (ref 1.7–7.7)
Neutrophils Relative %: 64 %
Platelets: 243 10*3/uL (ref 150–400)
RBC: 3.1 MIL/uL — ABNORMAL LOW (ref 4.22–5.81)
RDW: 13.2 % (ref 11.5–15.5)
WBC: 7.6 10*3/uL (ref 4.0–10.5)
nRBC: 0 % (ref 0.0–0.2)

## 2022-04-10 LAB — BASIC METABOLIC PANEL
Anion gap: 6 (ref 5–15)
BUN: 25 mg/dL — ABNORMAL HIGH (ref 8–23)
CO2: 24 mmol/L (ref 22–32)
Calcium: 8.1 mg/dL — ABNORMAL LOW (ref 8.9–10.3)
Chloride: 100 mmol/L (ref 98–111)
Creatinine, Ser: 1.39 mg/dL — ABNORMAL HIGH (ref 0.61–1.24)
GFR, Estimated: 52 mL/min — ABNORMAL LOW (ref >60)
Glucose, Bld: 145 mg/dL — ABNORMAL HIGH (ref 70–99)
Potassium: 3.8 mmol/L (ref 3.5–5.1)
Sodium: 130 mmol/L — ABNORMAL LOW (ref 135–145)

## 2022-04-10 LAB — MAGNESIUM: Magnesium: 2.2 mg/dL (ref 1.7–2.4)

## 2022-04-10 MED ORDER — HYDRALAZINE HCL 50 MG PO TABS
50.0000 mg | ORAL_TABLET | Freq: Three times a day (TID) | ORAL | Status: DC
  Administered 2022-04-10 – 2022-04-13 (×9): 50 mg via ORAL
  Filled 2022-04-10 (×9): qty 1

## 2022-04-10 MED ORDER — AMIODARONE HCL IN DEXTROSE 360-4.14 MG/200ML-% IV SOLN
30.0000 mg/h | INTRAVENOUS | Status: DC
  Administered 2022-04-11 – 2022-04-12 (×3): 30 mg/h via INTRAVENOUS
  Filled 2022-04-10 (×3): qty 200

## 2022-04-10 MED ORDER — ASPIRIN 81 MG PO TBEC
81.0000 mg | DELAYED_RELEASE_TABLET | Freq: Every day | ORAL | Status: DC
  Administered 2022-04-11 – 2022-04-13 (×3): 81 mg via ORAL
  Filled 2022-04-10 (×3): qty 1

## 2022-04-10 MED ORDER — AMIODARONE LOAD VIA INFUSION
150.0000 mg | Freq: Once | INTRAVENOUS | Status: AC
  Administered 2022-04-10: 150 mg via INTRAVENOUS
  Filled 2022-04-10: qty 83.34

## 2022-04-10 MED ORDER — AMIODARONE HCL IN DEXTROSE 360-4.14 MG/200ML-% IV SOLN
60.0000 mg/h | INTRAVENOUS | Status: AC
  Administered 2022-04-10 (×2): 60 mg/h via INTRAVENOUS
  Filled 2022-04-10 (×2): qty 200

## 2022-04-10 MED ORDER — CARVEDILOL 25 MG PO TABS
25.0000 mg | ORAL_TABLET | Freq: Two times a day (BID) | ORAL | Status: DC
  Administered 2022-04-10 – 2022-04-12 (×4): 25 mg via ORAL
  Filled 2022-04-10 (×4): qty 1

## 2022-04-10 MED ORDER — APIXABAN 5 MG PO TABS
5.0000 mg | ORAL_TABLET | Freq: Two times a day (BID) | ORAL | Status: DC
  Administered 2022-04-10 – 2022-04-13 (×6): 5 mg via ORAL
  Filled 2022-04-10 (×6): qty 1

## 2022-04-10 MED ORDER — ENOXAPARIN SODIUM 40 MG/0.4ML IJ SOSY: 40.0000 mg | PREFILLED_SYRINGE | Freq: Every day | INTRAMUSCULAR | Status: DC

## 2022-04-10 MED ORDER — CARVEDILOL 12.5 MG PO TABS
12.5000 mg | ORAL_TABLET | Freq: Two times a day (BID) | ORAL | Status: DC
  Administered 2022-04-10: 12.5 mg via ORAL
  Filled 2022-04-10: qty 1

## 2022-04-10 NOTE — TOC Benefit Eligibility Note (Signed)
Patient Teacher, English as a foreign language completed.    The patient is currently admitted and upon discharge could be taking Eliquis 5 mg.  The current 30 day co-pay is $30.00.   The patient is currently admitted and upon discharge could be taking Xarelto 20 mg.  The current 30 day co-pay is $30.00.   The patient is insured through [place] Medicare part D   Lyndel Safe, [place] Patient Advocate Specialist [place] Patient Advocate Team Direct Number: [phone]  Fax: [phone]

## 2022-04-10 NOTE — Progress Notes (Addendum)
ANTICOAGULATION CONSULT NOTE - Initial Consult  Pharmacy Consult for apixaban Indication: atrial fibrillation  Allergies  Allergen Reactions   Atorvastatin Other (See Comments)    Joint and muscle aches, weakness present   Zetia [Ezetimibe] Other (See Comments)    Elevated liver enzymes   Zocor [Simvastatin] Other (See Comments)    Joint and muscle aches   Codeine Other (See Comments)    Passed out    Patient Measurements: Height: 5\' 9"  (175.3 cm) Weight: 93.3 kg (205 lb 11 oz) IBW/kg (Calculated) : 70.7   Vital Signs: Temp: 98 F (36.7 C) (03/19 1131) Temp Source: Oral (03/19 1131) BP: 174/84 (03/19 1148) Pulse Rate: 119 (03/19 1131)  Labs: Recent Labs    04/09/22 0304 04/09/22 0621 04/09/22 0831 04/09/22 1448 04/10/22 0212  HGB 11.1*  --   --  10.0* 9.5*  HCT 33.1*  --   --  29.7* 27.9*  PLT 273  --   --  256 243  CREATININE 1.28*  --   --  1.54* 1.39*  TROPONINIHS 24* 23* 22*  --   --     Estimated Creatinine Clearance: 49.4 mL/min (A) (by C-G formula based on SCr of 1.39 mg/dL (H)).   Medical History: Past Medical History:  Diagnosis Date   Adenomatous colon polyp 11/22/2008   Dr. Gala Romney   Allergy    Carotid artery stenosis    Chronic back pain    CKD (chronic kidney disease)    DDD (degenerative disc disease), lumbar    GERD (gastroesophageal reflux disease)    Heart murmur 2023   Hemorrhoids, external    High cholesterol    History of kidney stones 2013   HTN (hypertension)    Hyperplastic colon polyp 11/22/2008   Dr. Gala Romney   Hypothyroidism    Kidney stone    "14 years ago" per pt   Meniere's disease    Meniere's disease    Pneumonia 1965   PTSD (post-traumatic stress disorder)    Tachyarrhythmia    Type 2 diabetes mellitus ([place])     Medications:  Medications Prior to Admission  Medication Sig Dispense Refill Last Dose   acetaminophen (TYLENOL) 325 MG tablet Take 650 mg by mouth every 6 (six) hours as needed for moderate pain.    Past Week   albuterol (VENTOLIN HFA) 108 (90 Base) MCG/ACT inhaler TAKE 2 PUFFS BY MOUTH EVERY 6 HOURS AS NEEDED FOR WHEEZE OR SHORTNESS OF BREATH (Patient taking differently: Inhale 1 puff into the lungs every 6 (six) hours as needed for shortness of breath.) 18 g 6 Past Week   Alirocumab (PRALUENT) 150 MG/ML SOAJ INJECT 1 PEN INTO THE SKIN EVERY 14 (FOURTEEN) DAYS. 6 mL 3 03/23/2022   Ascorbic Acid (VITAMIN C) 1000 MG tablet Take 1,000 mg by mouth 3 (three) times a week.   04/07/2022   b complex vitamins capsule Take 1 capsule by mouth once a week.   04/04/2022   Cholecalciferol (VITAMIN D3) 125 MCG (5000 UT) CAPS Take 5,000 Units by mouth 2 (two) times a week.   Past Week   clopidogrel (PLAVIX) 75 MG tablet TAKE 1 TABLET BY MOUTH EVERY DAY 90 tablet 4 04/07/2022   diazepam (VALIUM) 2 MG tablet TAKE ONE TABLET (2MG  TOTAL) BY MOUTH DAILY AS NEEDED FOR DIZZINESS SECONDARY TO MENIERE'S DISEASE (Patient taking differently: Take 2 mg by mouth every 8 (eight) hours as needed for anxiety (For dizziness secondary to meniere's disease).) 90 tablet 0 Past Week   diclofenac  Sodium (VOLTAREN) 1 % GEL Apply 2 g topically daily as needed (pain).   Past Week   DULoxetine (CYMBALTA) 60 MG capsule Take 1 capsule (60 mg total) by mouth daily. 90 capsule 1 04/07/2022   glipiZIDE (GLUCOTROL) 5 MG tablet Take 1 tablet (5 mg total) by mouth 2 (two) times daily before a meal. 360 tablet 1 04/07/2022   hydrALAZINE (APRESOLINE) 25 MG tablet Take 50 mg by mouth 3 (three) times daily.   04/07/2022   levothyroxine (SYNTHROID) 25 MCG tablet Take 25 mcg by mouth daily before breakfast.   04/07/2022   Multiple Vitamin (MULTIVITAMIN WITH MINERALS) TABS Take 1 tablet by mouth daily.   04/07/2022   pantoprazole (PROTONIX) 40 MG tablet TAKE 1 TABLET BY MOUTH EVERY DAY (Patient taking differently: Take 40 mg by mouth daily.) 90 tablet 1 04/07/2022   primidone (MYSOLINE) 50 MG tablet Take 1 tablet (50 mg total) by mouth 2 (two) times daily.  (Patient taking differently: Take 50 mg by mouth daily.) 180 tablet 1 04/07/2022   sodium chloride (OCEAN) 0.65 % SOLN nasal spray Place 1 spray into both nostrils at bedtime.   Past Week   TIADYLT ER 360 MG 24 hr capsule TAKE 1 CAPSULE BY MOUTH EVERY DAY (Patient taking differently: Take 360 mg by mouth daily.) 90 capsule 1 04/07/2022   timolol (BETIMOL) 0.5 % ophthalmic solution Place 1 drop into both eyes daily.   04/07/2022   traMADol (ULTRAM) 50 MG tablet Take 1 tablet (50 mg total) by mouth every 6 (six) hours as needed. (Patient taking differently: Take 50 mg by mouth every 6 (six) hours as needed for moderate pain.) 15 tablet 0 Past Week   Scheduled:   [START ON 04/11/2022] aspirin EC  81 mg Oral Daily   carvedilol  25 mg Oral BID WC   Chlorhexidine Gluconate Cloth  6 each Topical Q0600   DULoxetine  60 mg Oral Daily   enoxaparin (LOVENOX) injection  40 mg Subcutaneous QHS   hydrALAZINE  50 mg Oral Q8H   insulin aspart  0-5 Units Subcutaneous QHS   insulin aspart  0-9 Units Subcutaneous TID WC   insulin aspart  3 Units Subcutaneous TID WC   insulin glargine-yfgn  5 Units Subcutaneous QHS   levothyroxine  25 mcg Oral Q0600   pantoprazole  40 mg Oral QHS   primidone  50 mg Oral BID   sodium chloride  1 spray Each Nare QHS   sodium chloride flush  3 mL Intravenous Q12H   sodium chloride flush  3 mL Intravenous Q12H    Assessment: 77 yo male with afib vs aflutter to start apixaban. He is on plavix PTA for CEA in 10/2021 and plans are to discontinue. Pharmacy consulted to dose apixaban -SCr= 1.38, wt ~ 93kg -hg= 9.5 -cost of apixaban= $30  He is on primidone for tremors which was started ~ 3/5. He is was taking 50mg  po once daily (with the goal to increase to 50mg  bid).  Primidone induces the metabolism of apixaban and the combination is typically avoided. With a low primidone dose and time needed to reach steady state, I think the the primidone can be discontinued and apixaban can be  started today  I spoke with the patient and he was agreeable to discontinuing apixaban. I asked him to follow up with his Neurologist if he needs an alternative.   Goal of Therapy:  Monitor platelets by anticoagulation protocol: Yes   Plan:  -apixaban 5mg  po bid -  discontinue primidone  Hildred Laser, PharmD Clinical Pharmacist **Pharmacist phone directory can now be found on [email or web address] ([email or web address]).  Listed under [place].

## 2022-04-10 NOTE — Consult Note (Addendum)
ELECTROPHYSIOLOGY CONSULT NOTE    Patient ID: Kyle Dunlap MRN: [id number], DOB/AGE: July 06, 1945 77 y.o.  Admit date: 04/08/2022 Date of Consult: 04/10/2022  Primary Physician: Coral Spikes, DO Primary Cardiologist: Jenkins Rouge, MD  Electrophysiologist: New   Referring Provider: Dr. Ellyn Hack  Patient Profile: Kyle Dunlap is a 77 y.o. 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 Ellyn Hack.  HPI:  Pt presented to [facility] 04/08/2022 with chest pressure into his shoulder with SOB that awakeded him from his sleep.  Found to be in SVT that "slowed down" with adenosine and diltiazem but was still having frequent runs. Pt did have some ST elevation inf/lat on EKG, so was transferred to [facility] for cath and further observation.   Pt has continued to have brief paroxysms of SVT on cardizem. This had further SVT -> NSR -> Irregular rhythm concerning for AF vs AFL.  EP asked to see for AAD recommendations given pts symptoms.   Currently, he is feeling OK at rest. Denies any further CP while in AF. No SOB, edema, or syncope.  He initially stated he had some occasional palpitations, but denied on repeat questioning. He has had some short episodes of fatigue.    Labs Potassium3.8 (03/19 [id number]) Magnesium  2.2 (03/19 [id number]) Creatinine, ser  1.39* (03/19 [id number]) PLT  243 (03/19 0212) HGB  9.5* (03/19 0212) WBC 7.6 (03/19 0212) Troponin I (High Sensitivity)22* (03/18 0831).    Allergies, Past Medical, Surgical, Social, and Family Histories have been reviewed and are referenced here-in when relevant for medical decision making.   Inpatient Medications:   carvedilol  12.5 mg Oral BID WC   Chlorhexidine Gluconate Cloth  6 each Topical Q0600   clopidogrel  75 mg Oral Daily   DULoxetine  60 mg Oral Daily   enoxaparin (LOVENOX) injection  40 mg Subcutaneous QHS   hydrALAZINE  50 mg Oral Q8H    insulin aspart  0-5 Units Subcutaneous QHS   insulin aspart  0-9 Units Subcutaneous TID WC   insulin aspart  3 Units Subcutaneous TID WC   insulin glargine-yfgn  5 Units Subcutaneous QHS   levothyroxine  25 mcg Oral Q0600   pantoprazole  40 mg Oral QHS   primidone  50 mg Oral BID   sodium chloride  1 spray Each Nare QHS   sodium chloride flush  3 mL Intravenous Q12H   sodium chloride flush  3 mL Intravenous Q12H    Physical Exam: Vitals:   04/10/22 0016 04/10/22 0626 04/10/22 0647 04/10/22 0740  BP: (!) 155/67  (!) 200/86 (!) 179/76  Pulse: 66 88  73  Resp: 18 18  20   Temp: 97.9 F (36.6 C) 97.7 F (36.5 C)  97.7 F (36.5 C)  TempSrc: Oral Oral  Oral  SpO2: 97% 96%  97%  Weight:      Height:        GEN- NAD, A&O x 3, normal affect HEENT: Normocephalic, atraumatic Lungs- CTAB, Normal effort.  Heart- Regular rate and rhythm, No M/G/R.  GI- Soft, NT, ND.  Extremities- No clubbing, cyanosis, or edema   Radiology/Studies:  LHC 04/09/2022  Mid LM to Dist LM lesion is 35% stenosed.  Prox RCA lesion is 45% stenosed.  LVEDP of 23 mmHg.  There is no aortic valve stenosis.   Echo 04/09/2022  1. Left ventricular ejection fraction, by  estimation, is 70 to 75%. The left ventricle has hyperdynamic function. Left ventricular endocardial border not optimally defined to evaluate regional wall motion. Left ventricular diastolic parameters are indeterminate.   2. Right ventricular systolic function is normal. The right ventricular size is normal.   3. Left atrial size was moderately dilated.   4. The mitral valve is normal in structure. Trivial mitral valve regurgitation. No evidence of mitral stenosis.   5. The aortic valve is tricuspid. Aortic valve regurgitation is not visualized. No aortic stenosis is present.   CXR 04/04/22  Increased central airway and fissural thickening compared with prior study, suggesting bronchitis. No evidence of pneumonia or edema. Follow-up as clinically  warranted.   EKG:on arrival 3/13 showed NSR at 70 bpm (personally reviewed)  EKG 3/17 shows narrow complex tachycardia at 164 bpm ? SVT vs 2:1 flutter  TELEMETRY: NSR has been replaced by  (personally reviewed)  Assessment/Plan: SVT AF vs AFL  EKG on arrival shows narrow, regular tachycardia ~ 160 bpm CHA2DS2VASc is at least 7 (Age, TIA, HTN, Vasc, DM). Bulah Lurie need to start on Eliquis. Edith Groleau discuss plavix with neuro as below.  Increase coreg to 25 mg BID for tonights dose.  Pt declines prolonged stay for Tikosyn.  Pt would prefer amiodarone for now with consideration of ablation down the road.  Elizah Mierzwa recommend IV load and DCC in 1-2 days if does not convert.  Outpatient EP f/u made.   Chest pain Cath without target lesion, likely demand ischemia in setting of SVT No pain currently in AF with variable rates.   HTN Coreg increased this am.   CKD stage 3a Cr 1.39 today, baseline 1.3-14 CrCl by actual weight today is 57.8.   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  OK per neuro and [facility] to stop plavix and start Eliquis + ASA  Hyperthyroidism TSH 5.147 and Free T4 1.31 on admission Long term amiodarone not a great option, but also on very low dose of levothyroxine.   Dr. Curt Bears has seen. Recommend amiodarone as bridge to ablation. Switch from Plavix to Eliquis + ASA (Ok per Neuro and [facility]).   For questions or updates, please contact [place] Please consult [email or web address] for contact info under Cardiology/STEMI.  Signed, Shirley Friar, PA-C  04/10/2022 11:32 AM   I have seen and examined this patient with Oda Kilts.  Agree with above, note added to reflect my findings.  Patient with a history of hypertension, hyperlipidemia, left CEA complicated by TIA presented to hospital with episodes of SVT.  He was having chest pressure during these episodes.  He was started on carvedilol.  He has not had any further episodes  of SVT on carvedilol, but has gone into atrial fibrillation.  He has intermittent episodes of fatigue and weakness as well as shortness of breath.  GEN: Well nourished, well developed, in no acute distress  HEENT: normal  Neck: no JVD, carotid bruits, or masses Cardiac: irregular; no murmurs, rubs, or gallops,no edema  Respiratory:  clear to auscultation bilaterally, normal work of breathing GI: soft, nontender, nondistended, + BS MS: no deformity or atrophy  Skin: warm and dry Neuro:  Strength and sensation are intact Psych: euthymic mood, full affect   SVT Atrial fibrillation EKG on arrival shows episodes of SVT.  He has gone into atrial fibrillation as well.  He has a history of TIA around the time of carotid endarterectomy.  Frankie Scipio require anticoagulation.  Adelaine Roppolo start  Eliquis today.  As he went into atrial fibrillation today, Jeannine Pennisi plan for IV amiodarone load.  If he does not convert and is rate controlled, could plan for cardioversion as an outpatient.  If he is poorly controlled, Bowen Goyal likely need cardioversion as an inpatient.  Sadeel Fiddler arrange for follow-up in EP clinic to discuss further options including ablation.  Zemirah Krasinski also increase carvedilol to 25 mg for improved rate control.  When to charge, would plan for amiodarone 400 mg twice daily for 2 weeks followed by 200 mg daily.  Hypothyroidism: Currently on Synthroid.  Velena Keegan plan to limit length of amiodarone usage.  Kaikoa Magro M. Kendall Justo MD 04/10/2022 5:18 PM

## 2022-04-10 NOTE — Discharge Instructions (Signed)

## 2022-04-10 NOTE — Plan of Care (Signed)

## 2022-04-10 NOTE — Progress Notes (Addendum)
Rounding Note    Patient Name: Kyle Dunlap Date of Encounter: 04/10/2022  [place] Cardiologist: Jenkins Rouge, MD   Subjective   Feeling well this morning. No chest pain. While in the room, he converted into a narrow complex tachycardia, then back into a sinus rhythm, then back to a more irregular rhythm.   Inpatient Medications    Scheduled Meds:  carvedilol  6.25 mg Oral BID WC   Chlorhexidine Gluconate Cloth  6 each Topical Q0600   clopidogrel  75 mg Oral Daily   DULoxetine  60 mg Oral Daily   heparin  5,000 Units Subcutaneous Q8H   insulin aspart  0-5 Units Subcutaneous QHS   insulin aspart  0-9 Units Subcutaneous TID WC   insulin aspart  3 Units Subcutaneous TID WC   insulin glargine-yfgn  5 Units Subcutaneous QHS   levothyroxine  25 mcg Oral Q0600   pantoprazole  40 mg Oral QHS   primidone  50 mg Oral BID   sodium chloride  1 spray Each Nare QHS   sodium chloride flush  3 mL Intravenous Q12H   sodium chloride flush  3 mL Intravenous Q12H   Continuous Infusions:  sodium chloride 50 mL/hr at 04/09/22 2300   sodium chloride     sodium chloride     diltiazem (CARDIZEM) infusion Stopped (04/09/22 1511)   PRN Meds: sodium chloride, sodium chloride, acetaminophen **OR** acetaminophen, bisacodyl, diazepam, fentaNYL (SUBLIMAZE) injection, hydrALAZINE, ondansetron **OR** ondansetron (ZOFRAN) IV, oxyCODONE, sodium chloride flush, sodium chloride flush, traZODone   Vital Signs    Vitals:   04/10/22 0016 04/10/22 0626 04/10/22 0647 04/10/22 0740  BP: (!) 155/67  (!) 200/86 (!) 179/76  Pulse: 66 88  73  Resp: 18 18  20   Temp: 97.9 F (36.6 C) 97.7 F (36.5 C)  97.7 F (36.5 C)  TempSrc: Oral Oral  Oral  SpO2: 97% 96%  97%  Weight:      Height:        Intake/Output Summary (Last 24 hours) at 04/10/2022 0901 Last data filed at 04/09/2022 2300 Gross per 24 hour  Intake 976.92 ml  Output --  Net 976.92 ml      04/09/2022    5:08 AM 04/08/2022     5:30 PM 04/08/2022   10:47 AM  Last 3 Weights  Weight (lbs) 205 lb 11 oz 203 lb 0.7 oz 207 lb 3.7 oz  Weight (kg) 93.3 kg 92.1 kg 94 kg      Telemetry    Sinus Rhythm-->narrow complex tachycardia--> atrial fib? With PVCs - Personally Reviewed  ECG    No new tracing this morning  Physical Exam   GEN: No acute distress.   Neck: No JVD Cardiac: tachycardia, no murmurs, rubs, or gallops.  Respiratory: Clear to auscultation bilaterally. GI: Soft, nontender, non-distended  MS: No edema; No deformity. Right radial cath site stable.  Neuro:  Nonfocal  Psych: Normal affect   Labs    High Sensitivity Troponin:   Recent Labs  Lab 04/08/22 1305 04/08/22 2322 04/09/22 0304 04/09/22 0621 04/09/22 0831  TROPONINIHS 37* 28* 24* 23* 22*     Chemistry Recent Labs  Lab 04/08/22 1053 04/08/22 1507 04/09/22 0304 04/09/22 1448 04/10/22 0212  NA 131*  --  129*  --  130*  K 4.1  --  4.0  --  3.8  CL 96*  --  98  --  100  CO2 24  --  23  --  24  GLUCOSE 161*  --  143*  --  145*  BUN 26*  --  26*  --  25*  CREATININE 1.37*  --  1.28* 1.54* 1.39*  CALCIUM 8.7*  --  8.1*  --  8.1*  MG  --  1.8 2.7*  --  2.2  GFRNONAA 53*  --  57* 46* 52*  ANIONGAP 11  --  8  --  6    Lipids  Recent Labs  Lab 04/09/22 0304  CHOL 128  TRIG 120  HDL 63  LDLCALC 41  CHOLHDL 2.0    Hematology Recent Labs  Lab 04/09/22 0304 04/09/22 1448 04/10/22 0212  WBC 9.8 8.5 7.6  RBC 3.66* 3.28* 3.10*  HGB 11.1* 10.0* 9.5*  HCT 33.1* 29.7* 27.9*  MCV 90.4 90.5 90.0  MCH 30.3 30.5 30.6  MCHC 33.5 33.7 34.1  RDW 13.2 13.1 13.2  PLT 273 256 243   Thyroid  Recent Labs  Lab 04/08/22 1507 04/09/22 0910  TSH 5.147*  --   FREET4  --  1.31*    BNP Recent Labs  Lab 04/09/22 0304  BNP 180.0*    DDimer No results for input(s): "DDIMER" in the last 168 hours.   Radiology    CARDIAC CATHETERIZATION  Result Date: 04/09/2022   Mid LM to Dist LM lesion is 35% stenosed.   Prox RCA lesion  is 45% stenosed.   LVEDP of 23 mmHg.   There is no aortic valve stenosis. For the most part, angiographically minimal CAD with the exception of moderate lesions in the distal Left Main 35% and proximal RCA 45% Moderately elevated LVEDP of 23 mmHg.  (Preserved EF on Echo) Glenetta Hew, MD  ECHOCARDIOGRAM COMPLETE  Result Date: 04/09/2022    ECHOCARDIOGRAM REPORT   Patient Name:   Kyle Dunlap Date of Exam: 04/09/2022 Medical Rec #:  [id number]          Height:       69.0 in Accession #:    [id number]         Weight:       205.7 lb Date of Birth:  Jun 01, 1945           BSA:          2.091 m Patient Age:    77 years           BP:           144/94 mmHg Patient Gender: M                  HR:           66 bpm. Exam Location:  [facility] Procedure: 2D Echo, Color Doppler and Cardiac Doppler Indications:    Atrial Fibrillation I48.91  History:        Patient has no prior history of Echocardiogram examinations.                 Carotid Disease, Arrythmias:Tachycardia and Atrial Fibrillation,                 Signs/Symptoms:Murmur; Risk Factors:Hypertension, Dyslipidemia                 and Diabetes.  Sonographer:    Greer Pickerel Referring Phys: [id number] Murlean Iba  Sonographer Comments: No subcostal window. Image acquisition challenging due to patient body habitus and Image acquisition challenging due to respiratory motion. IMPRESSIONS  1. Left ventricular ejection fraction, by estimation, is 70 to 75%. The left ventricle  has hyperdynamic function. Left ventricular endocardial border not optimally defined to evaluate regional wall motion. Left ventricular diastolic parameters are indeterminate.  2. Right ventricular systolic function is normal. The right ventricular size is normal.  3. Left atrial size was moderately dilated.  4. The mitral valve is normal in structure. Trivial mitral valve regurgitation. No evidence of mitral stenosis.  5. The aortic valve is tricuspid. Aortic valve regurgitation is not visualized.  No aortic stenosis is present. Comparison(s): No prior Echocardiogram. FINDINGS  Left Ventricle: Left ventricular ejection fraction, by estimation, is 70 to 75%. The left ventricle has hyperdynamic function. Left ventricular endocardial border not optimally defined to evaluate regional wall motion. The left ventricular internal cavity size was normal in size. There is no left ventricular hypertrophy. Left ventricular diastolic parameters are indeterminate. Right Ventricle: The right ventricular size is normal. No increase in right ventricular wall thickness. Right ventricular systolic function is normal. Left Atrium: Left atrial size was moderately dilated. Right Atrium: Right atrial size was normal in size. Pericardium: Trivial pericardial effusion is present. Mitral Valve: The mitral valve is normal in structure. Trivial mitral valve regurgitation. No evidence of mitral valve stenosis. Tricuspid Valve: The tricuspid valve is normal in structure. Tricuspid valve regurgitation is not demonstrated. No evidence of tricuspid stenosis. Aortic Valve: The aortic valve is tricuspid. Aortic valve regurgitation is not visualized. No aortic stenosis is present. Pulmonic Valve: The pulmonic valve was not well visualized. Pulmonic valve regurgitation is not visualized. No evidence of pulmonic stenosis. Aorta: The aortic root and ascending aorta are structurally normal, with no evidence of dilitation. Venous: The inferior vena cava was not well visualized. IAS/Shunts: The interatrial septum was not well visualized.  LEFT VENTRICLE PLAX 2D LVIDd:         4.20 cm   Diastology LVIDs:         2.50 cm   LV e' medial:    8.24 cm/s LV PW:         1.10 cm   LV E/e' medial:  14.4 LV IVS:        1.10 cm   LV e' lateral:   14.10 cm/s LVOT diam:     1.70 cm   LV E/e' lateral: 8.4 LV SV:         53 LV SV Index:   25 LVOT Area:     2.27 cm  RIGHT VENTRICLE RV S prime:     9.32 cm/s TAPSE (M-mode): 1.6 cm LEFT ATRIUM              Index         RIGHT ATRIUM           Index LA diam:        4.10 cm  1.96 cm/m   RA Area:     11.40 cm LA Vol (A2C):   108.0 ml 51.66 ml/m  RA Volume:   22.40 ml  10.71 ml/m LA Vol (A4C):   86.3 ml  41.28 ml/m LA Biplane Vol: 99.1 ml  47.40 ml/m  AORTIC VALVE LVOT Vmax:   125.00 cm/s LVOT Vmean:  75.500 cm/s LVOT VTI:    0.233 m  AORTA Ao Root diam: 3.40 cm Ao Asc diam:  3.30 cm MITRAL VALVE                TRICUSPID VALVE MV Area (PHT): 3.42 cm     TR Peak grad:   28.3 mmHg MV Decel Time: 222 msec  TR Vmax:        266.00 cm/s MV E velocity: 119.00 cm/s MV A velocity: 91.30 cm/s   SHUNTS MV E/A ratio:  1.30         Systemic VTI:  0.23 m                             Systemic Diam: 1.70 cm Vishnu Priya Mallipeddi Electronically signed by Lorelee Cover Mallipeddi Signature Date/Time: 04/09/2022/1:15:12 PM    Final    Portable chest 1 View  Result Date: 04/09/2022 CLINICAL DATA:  Shortness of breath. EXAM: PORTABLE CHEST 1 VIEW COMPARISON:  04/08/2022. FINDINGS: Heart is enlarged and the mediastinal contour is within normal limits. There is atherosclerotic calcification of the aorta. Lung volumes are low with mild atelectasis at the lung bases. No effusion or pneumothorax. No acute osseous abnormality. IMPRESSION: Low lung volumes with mild atelectasis at the lung bases. Electronically Signed   By: Brett Fairy M.D.   On: 04/09/2022 04:15   DG Chest Port 1 View  Result Date: 04/08/2022 CLINICAL DATA:  Rapid heart rate. Chest pain and shortness of breath. Dizziness and weakness. EXAM: PORTABLE CHEST 1 VIEW COMPARISON:  04/03/2012 FINDINGS: Heart size and mediastinal contours are stable. Aortic atherosclerosis. Lung volumes are low. Blunting of the left costophrenic angle noted, similar to previous exam. No signs of interstitial edema or airspace consolidation. IMPRESSION: 1. Low lung volumes. 2. Persistent blunting of the left costophrenic angle compatible with small effusion. Electronically Signed   By: Kerby Moors  M.D.   On: 04/08/2022 11:41    Cardiac Studies   Cath: 04/09/2022    Mid LM to Dist LM lesion is 35% stenosed.   Prox RCA lesion is 45% stenosed.   LVEDP of 23 mmHg.   There is no aortic valve stenosis.   For the most part, angiographically minimal CAD with the exception of moderate lesions in the distal Left Main 35% and proximal RCA 45% Moderately elevated LVEDP of 23 mmHg.  (Preserved EF on Echo)   Glenetta Hew, MD  Echo: 04/09/2022  IMPRESSIONS     1. Left ventricular ejection fraction, by estimation, is 70 to 75%. The  left ventricle has hyperdynamic function. Left ventricular endocardial  border not optimally defined to evaluate regional wall motion. Left  ventricular diastolic parameters are  indeterminate.   2. Right ventricular systolic function is normal. The right ventricular  size is normal.   3. Left atrial size was moderately dilated.   4. The mitral valve is normal in structure. Trivial mitral valve  regurgitation. No evidence of mitral stenosis.   5. The aortic valve is tricuspid. Aortic valve regurgitation is not  visualized. No aortic stenosis is present.   Comparison(s): No prior Echocardiogram.   FINDINGS   Left Ventricle: Left ventricular ejection fraction, by estimation, is 70  to 75%. The left ventricle has hyperdynamic function. Left ventricular  endocardial border not optimally defined to evaluate regional wall motion.  The left ventricular internal  cavity size was normal in size. There is no left ventricular hypertrophy.  Left ventricular diastolic parameters are indeterminate.   Right Ventricle: The right ventricular size is normal. No increase in  right ventricular wall thickness. Right ventricular systolic function is  normal.   Left Atrium: Left atrial size was moderately dilated.   Right Atrium: Right atrial size was normal in size.   Pericardium: Trivial pericardial effusion is present.   Mitral  Valve: The mitral valve is normal in  structure. Trivial mitral  valve regurgitation. No evidence of mitral valve stenosis.   Tricuspid Valve: The tricuspid valve is normal in structure. Tricuspid  valve regurgitation is not demonstrated. No evidence of tricuspid  stenosis.   Aortic Valve: The aortic valve is tricuspid. Aortic valve regurgitation is  not visualized. No aortic stenosis is present.   Pulmonic Valve: The pulmonic valve was not well visualized. Pulmonic valve  regurgitation is not visualized. No evidence of pulmonic stenosis.   Aorta: The aortic root and ascending aorta are structurally normal, with  no evidence of dilitation.   Venous: The inferior vena cava was not well visualized.   IAS/Shunts: The interatrial septum was not well visualized.    Patient Profile     77 y.o. 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 Wynetta Emery.   Assessment & Plan    SVT Narrow complex tachycardia -- Found to be in SVT which was slowed with adenosine and placed on diltiazem drip.  This was in the setting of steroid injection and inhalers in the setting of recent bronchitis. Diltiazem was weaned and transition to carvedilol 6.25 mg twice daily-->increased to 12.5mg  BID -- this morning while in the room developed a narrow complex tachycardia then into SR and back to more irregular rhythm, atrial fibrilllation vs atrial flutter? -- will review further with MD  Chest pain -- Initially presented with chest pain which woke him from sleep and radiated into his neck and jaw.  This was in the setting of SVT.  Given concern for ACS he was transferred to [facility] underwent cardiac catheterization noted above with minimal CAD.  Mentations for continued medical therapy.  HTN -- Poorly controlled. -- increase coreg 12.5mg  BID, given significantly elevated BPs will resume hydralazine 50mg  TID  HLD -- LDL 41, HDL 63 -- continue  statin, continue Praluent at discharge  CKD stage 3a -- baseline 1.3-1.4  DM -- On SSI -- Hemoglobin A1c 6.9  For questions or updates, please contact [place] Please consult [email or web address] for contact info under        Signed, Reino Bellis, NP  04/10/2022, 9:01 AM    I have examined the patient and reviewed assessment and plan and discussed with patient.  Agree with above as stated.    Appears to be in AFib, borderline rate control.  Start Eliquis for stroke prevention.  Primidone stopped due to interaction with Eliquis.  Coreg for rate control.   Continue with efforts for BP control.   Larae Grooms

## 2022-04-11 ENCOUNTER — Inpatient Hospital Stay (HOSPITAL_COMMUNITY): Payer: Medicare Other

## 2022-04-11 DIAGNOSIS — I480 Paroxysmal atrial fibrillation: Secondary | ICD-10-CM

## 2022-04-11 DIAGNOSIS — N1831 Chronic kidney disease, stage 3a: Secondary | ICD-10-CM

## 2022-04-11 DIAGNOSIS — J9601 Acute respiratory failure with hypoxia: Secondary | ICD-10-CM

## 2022-04-11 LAB — GLUCOSE, CAPILLARY
Glucose-Capillary: 182 mg/dL — ABNORMAL HIGH (ref 70–99)
Glucose-Capillary: 101 mg/dL — ABNORMAL HIGH (ref 70–99)
Glucose-Capillary: 156 mg/dL — ABNORMAL HIGH (ref 70–99)
Glucose-Capillary: 196 mg/dL — ABNORMAL HIGH (ref 70–99)

## 2022-04-11 LAB — BASIC METABOLIC PANEL
Anion gap: 8 (ref 5–15)
BUN: 23 mg/dL (ref 8–23)
CO2: 20 mmol/L — ABNORMAL LOW (ref 22–32)
Calcium: 8.1 mg/dL — ABNORMAL LOW (ref 8.9–10.3)
Chloride: 102 mmol/L (ref 98–111)
Creatinine, Ser: 1.29 mg/dL — ABNORMAL HIGH (ref 0.61–1.24)
GFR, Estimated: 57 mL/min — ABNORMAL LOW (ref >60)
Glucose, Bld: 184 mg/dL — ABNORMAL HIGH (ref 70–99)
Potassium: 4 mmol/L (ref 3.5–5.1)
Sodium: 130 mmol/L — ABNORMAL LOW (ref 135–145)

## 2022-04-11 LAB — LIPOPROTEIN A (LPA): Lipoprotein (a): 27.2 nmol/L (ref <75.0)

## 2022-04-11 MED ORDER — FUROSEMIDE 10 MG/ML IJ SOLN
60.0000 mg | Freq: Once | INTRAMUSCULAR | Status: AC
  Administered 2022-04-11: 60 mg via INTRAVENOUS

## 2022-04-11 MED ORDER — NITROGLYCERIN 2 % TD OINT
1.0000 [in_us] | TOPICAL_OINTMENT | Freq: Four times a day (QID) | TRANSDERMAL | Status: DC
  Administered 2022-04-11 (×2): 1 [in_us] via TOPICAL
  Filled 2022-04-11 (×2): qty 1

## 2022-04-11 MED ORDER — FUROSEMIDE 10 MG/ML IJ SOLN
INTRAMUSCULAR | Status: AC
  Filled 2022-04-11: qty 6

## 2022-04-11 NOTE — Progress Notes (Addendum)
Rounding Note    Patient Name: Kyle Dunlap Date of Encounter: 04/11/2022  [place] Cardiologist: Jenkins Rouge, MD   Subjective   Called to bedside this morning with pt desat into the 60s, crackles throughout anterior lung field. Placed on NRB. BP> 123456 systolic.   Inpatient Medications    Scheduled Meds:  furosemide       apixaban  5 mg Oral BID   aspirin EC  81 mg Oral Daily   carvedilol  25 mg Oral BID WC   DULoxetine  60 mg Oral Daily   hydrALAZINE  50 mg Oral Q8H   insulin aspart  0-5 Units Subcutaneous QHS   insulin aspart  0-9 Units Subcutaneous TID WC   insulin aspart  3 Units Subcutaneous TID WC   insulin glargine-yfgn  5 Units Subcutaneous QHS   levothyroxine  25 mcg Oral Q0600   nitroGLYCERIN  1 inch Topical Q6H   pantoprazole  40 mg Oral QHS   sodium chloride  1 spray Each Nare QHS   sodium chloride flush  3 mL Intravenous Q12H   sodium chloride flush  3 mL Intravenous Q12H   Continuous Infusions:  sodium chloride 50 mL/hr at 04/10/22 2104   sodium chloride     sodium chloride     amiodarone 30 mg/hr (04/11/22 0141)   PRN Meds: furosemide, sodium chloride, sodium chloride, acetaminophen **OR** acetaminophen, bisacodyl, diazepam, fentaNYL (SUBLIMAZE) injection, hydrALAZINE, ondansetron **OR** ondansetron (ZOFRAN) IV, oxyCODONE, sodium chloride flush, sodium chloride flush, traZODone   Vital Signs    Vitals:   04/11/22 0307 04/11/22 0308 04/11/22 0509 04/11/22 0822  BP: (!) 157/64 (!) 157/64 (!) 152/75   Pulse: (!) 55 (!) 55 62 72  Resp: 16  16   Temp: (!) 97.5 F (36.4 C)  97.9 F (36.6 C)   TempSrc: Oral  Oral   SpO2: 92% 96% 97%   Weight:   96.2 kg   Height:        Intake/Output Summary (Last 24 hours) at 04/11/2022 0854 Last data filed at 04/11/2022 0219 Gross per 24 hour  Intake 1149.59 ml  Output 300 ml  Net 849.59 ml      04/11/2022    5:09 AM 04/09/2022    5:08 AM 04/08/2022    5:30 PM  Last 3 Weights  Weight  (lbs) 212 lb 205 lb 11 oz 203 lb 0.7 oz  Weight (kg) 96.163 kg 93.3 kg 92.1 kg      Telemetry    Sinus Rhythm, PVCs - Personally Reviewed  ECG    No new tracing this morning  Physical Exam   GEN: Increased WOB, on NRB, diaphoretic    Neck: + JVD Cardiac: RRR, no murmurs, rubs, or gallops.  Respiratory: Crackles throughout lungs GI: Soft, nontender, non-distended  MS: No edema; No deformity. Neuro:  Nonfocal  Psych: Normal affect   Labs    High Sensitivity Troponin:   Recent Labs  Lab 04/08/22 1305 04/08/22 2322 04/09/22 0304 04/09/22 0621 04/09/22 0831  TROPONINIHS 37* 28* 24* 23* 22*     Chemistry Recent Labs  Lab 04/08/22 1507 04/09/22 0304 04/09/22 1448 04/10/22 0212 04/11/22 0152  NA  --  129*  --  130* 130*  K  --  4.0  --  3.8 4.0  CL  --  98  --  100 102  CO2  --  23  --  24 20*  GLUCOSE  --  143*  --  145* 184*  BUN  --  26*  --  25* 23  CREATININE  --  1.28* 1.54* 1.39* 1.29*  CALCIUM  --  8.1*  --  8.1* 8.1*  MG 1.8 2.7*  --  2.2  --   GFRNONAA  --  57* 46* 52* 57*  ANIONGAP  --  8  --  6 8    Lipids  Recent Labs  Lab 04/09/22 0304  CHOL 128  TRIG 120  HDL 63  LDLCALC 41  CHOLHDL 2.0    Hematology Recent Labs  Lab 04/09/22 0304 04/09/22 1448 04/10/22 0212  WBC 9.8 8.5 7.6  RBC 3.66* 3.28* 3.10*  HGB 11.1* 10.0* 9.5*  HCT 33.1* 29.7* 27.9*  MCV 90.4 90.5 90.0  MCH 30.3 30.5 30.6  MCHC 33.5 33.7 34.1  RDW 13.2 13.1 13.2  PLT 273 256 243   Thyroid  Recent Labs  Lab 04/08/22 1507 04/09/22 0910  TSH 5.147*  --   FREET4  --  1.31*    BNP Recent Labs  Lab 04/09/22 0304  BNP 180.0*    DDimer No results for input(s): "DDIMER" in the last 168 hours.   Radiology    CARDIAC CATHETERIZATION  Result Date: 04/09/2022   Mid LM to Dist LM lesion is 35% stenosed.   Prox RCA lesion is 45% stenosed.   LVEDP of 23 mmHg.   There is no aortic valve stenosis. For the most part, angiographically minimal CAD with the exception of  moderate lesions in the distal Left Main 35% and proximal RCA 45% Moderately elevated LVEDP of 23 mmHg.  (Preserved EF on Echo) Glenetta Hew, MD  ECHOCARDIOGRAM COMPLETE  Result Date: 04/09/2022    ECHOCARDIOGRAM REPORT   Patient Name:   Kyle Dunlap Date of Exam: 04/09/2022 Medical Rec #:  [id number]          Height:       69.0 in Accession #:    [id number]         Weight:       205.7 lb Date of Birth:  1945-10-26           BSA:          2.091 m Patient Age:    77 years           BP:           144/94 mmHg Patient Gender: M                  HR:           66 bpm. Exam Location:  [facility] Procedure: 2D Echo, Color Doppler and Cardiac Doppler Indications:    Atrial Fibrillation I48.91  History:        Patient has no prior history of Echocardiogram examinations.                 Carotid Disease, Arrythmias:Tachycardia and Atrial Fibrillation,                 Signs/Symptoms:Murmur; Risk Factors:Hypertension, Dyslipidemia                 and Diabetes.  Sonographer:    Greer Pickerel Referring Phys: [id number] Murlean Iba  Sonographer Comments: No subcostal window. Image acquisition challenging due to patient body habitus and Image acquisition challenging due to respiratory motion. IMPRESSIONS  1. Left ventricular ejection fraction, by estimation, is 70 to 75%. The left ventricle has hyperdynamic function. Left ventricular endocardial border not optimally defined to  evaluate regional wall motion. Left ventricular diastolic parameters are indeterminate.  2. Right ventricular systolic function is normal. The right ventricular size is normal.  3. Left atrial size was moderately dilated.  4. The mitral valve is normal in structure. Trivial mitral valve regurgitation. No evidence of mitral stenosis.  5. The aortic valve is tricuspid. Aortic valve regurgitation is not visualized. No aortic stenosis is present. Comparison(s): No prior Echocardiogram. FINDINGS  Left Ventricle: Left ventricular ejection fraction, by  estimation, is 70 to 75%. The left ventricle has hyperdynamic function. Left ventricular endocardial border not optimally defined to evaluate regional wall motion. The left ventricular internal cavity size was normal in size. There is no left ventricular hypertrophy. Left ventricular diastolic parameters are indeterminate. Right Ventricle: The right ventricular size is normal. No increase in right ventricular wall thickness. Right ventricular systolic function is normal. Left Atrium: Left atrial size was moderately dilated. Right Atrium: Right atrial size was normal in size. Pericardium: Trivial pericardial effusion is present. Mitral Valve: The mitral valve is normal in structure. Trivial mitral valve regurgitation. No evidence of mitral valve stenosis. Tricuspid Valve: The tricuspid valve is normal in structure. Tricuspid valve regurgitation is not demonstrated. No evidence of tricuspid stenosis. Aortic Valve: The aortic valve is tricuspid. Aortic valve regurgitation is not visualized. No aortic stenosis is present. Pulmonic Valve: The pulmonic valve was not well visualized. Pulmonic valve regurgitation is not visualized. No evidence of pulmonic stenosis. Aorta: The aortic root and ascending aorta are structurally normal, with no evidence of dilitation. Venous: The inferior vena cava was not well visualized. IAS/Shunts: The interatrial septum was not well visualized.  LEFT VENTRICLE PLAX 2D LVIDd:         4.20 cm   Diastology LVIDs:         2.50 cm   LV e' medial:    8.24 cm/s LV PW:         1.10 cm   LV E/e' medial:  14.4 LV IVS:        1.10 cm   LV e' lateral:   14.10 cm/s LVOT diam:     1.70 cm   LV E/e' lateral: 8.4 LV SV:         53 LV SV Index:   25 LVOT Area:     2.27 cm  RIGHT VENTRICLE RV S prime:     9.32 cm/s TAPSE (M-mode): 1.6 cm LEFT ATRIUM              Index        RIGHT ATRIUM           Index LA diam:        4.10 cm  1.96 cm/m   RA Area:     11.40 cm LA Vol (A2C):   108.0 ml 51.66 ml/m  RA  Volume:   22.40 ml  10.71 ml/m LA Vol (A4C):   86.3 ml  41.28 ml/m LA Biplane Vol: 99.1 ml  47.40 ml/m  AORTIC VALVE LVOT Vmax:   125.00 cm/s LVOT Vmean:  75.500 cm/s LVOT VTI:    0.233 m  AORTA Ao Root diam: 3.40 cm Ao Asc diam:  3.30 cm MITRAL VALVE                TRICUSPID VALVE MV Area (PHT): 3.42 cm     TR Peak grad:   28.3 mmHg MV Decel Time: 222 msec     TR Vmax:        266.00  cm/s MV E velocity: 119.00 cm/s MV A velocity: 91.30 cm/s   SHUNTS MV E/A ratio:  1.30         Systemic VTI:  0.23 m                             Systemic Diam: 1.70 cm Vishnu Priya Mallipeddi Electronically signed by Lorelee Cover Mallipeddi Signature Date/Time: 04/09/2022/1:15:12 PM    Final     Cardiac Studies   Cath: 04/09/2022     Mid LM to Dist LM lesion is 35% stenosed.   Prox RCA lesion is 45% stenosed.   LVEDP of 23 mmHg.   There is no aortic valve stenosis.   For the most part, angiographically minimal CAD with the exception of moderate lesions in the distal Left Main 35% and proximal RCA 45% Moderately elevated LVEDP of 23 mmHg.  (Preserved EF on Echo)   Glenetta Hew, MD   Echo: 04/09/2022   IMPRESSIONS     1. Left ventricular ejection fraction, by estimation, is 70 to 75%. The  left ventricle has hyperdynamic function. Left ventricular endocardial  border not optimally defined to evaluate regional wall motion. Left  ventricular diastolic parameters are  indeterminate.   2. Right ventricular systolic function is normal. The right ventricular  size is normal.   3. Left atrial size was moderately dilated.   4. The mitral valve is normal in structure. Trivial mitral valve  regurgitation. No evidence of mitral stenosis.   5. The aortic valve is tricuspid. Aortic valve regurgitation is not  visualized. No aortic stenosis is present.   Comparison(s): No prior Echocardiogram.   FINDINGS   Left Ventricle: Left ventricular ejection fraction, by estimation, is 70  to 75%. The left ventricle has  hyperdynamic function. Left ventricular  endocardial border not optimally defined to evaluate regional wall motion.  The left ventricular internal  cavity size was normal in size. There is no left ventricular hypertrophy.  Left ventricular diastolic parameters are indeterminate.   Right Ventricle: The right ventricular size is normal. No increase in  right ventricular wall thickness. Right ventricular systolic function is  normal.   Left Atrium: Left atrial size was moderately dilated.   Right Atrium: Right atrial size was normal in size.   Pericardium: Trivial pericardial effusion is present.   Mitral Valve: The mitral valve is normal in structure. Trivial mitral  valve regurgitation. No evidence of mitral valve stenosis.   Tricuspid Valve: The tricuspid valve is normal in structure. Tricuspid  valve regurgitation is not demonstrated. No evidence of tricuspid  stenosis.   Aortic Valve: The aortic valve is tricuspid. Aortic valve regurgitation is  not visualized. No aortic stenosis is present.   Pulmonic Valve: The pulmonic valve was not well visualized. Pulmonic valve  regurgitation is not visualized. No evidence of pulmonic stenosis.   Aorta: The aortic root and ascending aorta are structurally normal, with  no evidence of dilitation.   Venous: The inferior vena cava was not well visualized.   IAS/Shunts: The interatrial septum was not well visualized.     Patient Profile     77 y.o. 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 Wynetta Emery.   Assessment & Plan    SVT Atrial fibrillation -- Found to be in SVT which was slowed with adenosine and  placed on diltiazem drip.  This was in the setting of steroid injection and inhalers in the setting of recent bronchitis. Diltiazem was weaned and transition to carvedilol.  -- the morning of 3/19 he developed narrow complex  tachycardia, followed but clear atrial fibrillation. EP consulted with recommendations for amiodarone short term with bridge to ablation. Will continue IV amiodarone this morning, and plan to transition to PO since respiratory status improves. -- back in sinus rhythm yesterday afternoon -- continue Eliquis 5mg  BID -- of note, primidone stopped in the setting of interaction with Eliquis  Pulmonary Edema Hypoxic respiratory failure -- desat to 69% this morning with respiratory distress. Sats improved with NRB -- LVEDP was mildly elevated on cath, net + >2L -- IV lasix 60mg  x1 now (already with some urine output noted) -- nitropaste -- CXR -- Bipap if does not improve   Chest pain -- Initially presented with chest pain which woke him from sleep and radiated into his neck and jaw.  This was in the setting of SVT.  Given concern for ACS he was transferred to [facility] underwent cardiac catheterization noted above with minimal CAD.  Recommendations for continued medical therapy.   HTN -- Poorly controlled, was improved but elevated again in the setting of respiratory distress -- continue coreg 25mg  BID, hydralazine 50mg  TID   HLD -- LDL 41, HDL 63 -- continue statin, continue Praluent at discharge   CKD stage 3a -- baseline 1.3-1.4 -- follow BMET   DM -- On SSI -- Hemoglobin A1c 6.9  For questions or updates, please contact [place] Please consult [email or web address] for contact info under        Signed, Reino Bellis, NP  04/11/2022, 8:54 AM    I have examined the patient and reviewed assessment and plan and discussed with patient.  Agree with above as stated.    I was present when the patient was having respiratory distress.  He improved after urinating post Lasix 60 mg IV x 1.  Nitropaste was placed to help with blood pressure.  BiPAP was considered but he stabilized on a facemask only.  Blood pressure came down.  Likely volume overload in the setting of diastolic  dysfunction from atrial fibrillation and severe hypertension. Follow renal function given the IV diuretics.  Larae Grooms

## 2022-04-11 NOTE — Progress Notes (Signed)
Servo air BIPAP in room if needed. Patient doing well at this time.

## 2022-04-11 NOTE — Progress Notes (Signed)
Rapid Response Event Note   Called to bedside for increased WOB and oxygen needs--patient now on NRB sats 95%, prior on RA. Arrived to find Cardiology team already at bedside placing orders: Lasix IV 60mg , CXR, Nitro paste q6h, Bipap. Assessment: Chest with crackles L>R, mild accessory muscle use, answering in short sentences, skin warm and dry. Patient remains A&O x4. BP noted to by quite elevated, other VS stable currently. No RRT needs at this time. Please call for further needs.   Newman Nickels, RN

## 2022-04-11 NOTE — TOC Initial Note (Signed)
Transition of Care [facility]) - Initial/Assessment Note    Patient Details  Name: Kyle Dunlap MRN: [id number] Date of Birth: [birth year]  [facility] CM/SW Contact:    Bethena Roys, RN Phone Number: 04/11/2022, 3:26 PM  Clinical Narrative: Risk for readmission assessment completed. Patient presented with chest pain as a transfer from [facility]. PTA patient was independent from home with spouse. Patient has PCP and gets to appointments without any issues. Case Manager will continue to follow for transition of care needs as the patient progresses.                  Expected Discharge Plan: Home/Self Care Barriers to Discharge: Continued Medical Work up   Patient Goals and CMS Choice Patient states their goals for this hospitalization and ongoing recovery are:: to return home   Expected Discharge Plan and Services   Discharge Planning Services: CM Consult   Living arrangements for the past 2 months: Single Family Home   Prior Living Arrangements/Services Living arrangements for the past 2 months: Single Family Home Lives with:: Spouse Patient language and need for interpreter reviewed:: Yes        Need for Family Participation in Patient Care: Yes (Comment) Care giver support system in place?: Yes (comment)   Criminal Activity/Legal Involvement Pertinent to Current Situation/Hospitalization: No - Comment as needed  Activities of Daily Living Home Assistive Devices/Equipment: None ADL Screening (condition at time of admission) Patient's cognitive ability adequate to safely complete daily activities?: Yes Is the patient deaf or have difficulty hearing?: No Does the patient have difficulty seeing, even when wearing glasses/contacts?: No Does the patient have difficulty concentrating, remembering, or making decisions?: No Patient able to express need for assistance with ADLs?: Yes Does the patient have difficulty dressing or bathing?:  No Independently performs ADLs?: Yes (appropriate for developmental age) Does the patient have difficulty walking or climbing stairs?: No Weakness of Legs: None Weakness of Arms/Hands: None  Permission Sought/Granted Permission sought to share information with : Case Manager, Family Supports   Emotional Assessment Appearance:: Appears stated age Attitude/Demeanor/Rapport: Engaged Affect (typically observed): Appropriate Orientation: : Oriented to Self, Oriented to Place, Oriented to  Time Alcohol / Substance Use: Not Applicable Psych Involvement: No (comment)  Admission diagnosis:  SVT (supraventricular tachycardia) [I47.10] Paroxysmal SVT (supraventricular tachycardia) [I47.10] Patient Active Problem List   Diagnosis Date Noted   Unstable angina ([place]) 04/09/2022   Paroxysmal SVT (supraventricular tachycardia) 04/08/2022   Intention tremor 04/08/2022   Hyponatremia 04/08/2022   Leukocytosis 04/08/2022   Anemia, unspecified 04/08/2022   Chest discomfort 04/08/2022   PVD (peripheral vascular disease) ([place]) 04/08/2022   SOB (shortness of breath) 03/02/2022   Cogwheel rigidity 12/04/2021   Carotid artery stenosis 11/20/2021   Generalized anxiety disorder 10/26/2021   Hypothyroidism 10/26/2021   Stage 3a chronic kidney disease ([place]) 10/26/2021   Type 2 diabetes mellitus with diabetic nephropathy, without long-term current use of insulin ([place]) 11/15/2020   Stage 3a chronic kidney disease ([place]) 11/15/2020   Chronic right-sided low back pain without sciatica 11/15/2020   Diabetic neuropathy ([place]) 11/15/2020   Statin myopathy 05/09/2020   Aortic atherosclerosis ([place]) 05/06/2020   Meniere's disease 03/20/2015   Hyperlipidemia LDL goal <70 07/15/2012   Essential hypertension, benign 07/15/2012   GERD (gastroesophageal reflux disease) 08/03/2011   PCP:  Coral Spikes, DO Pharmacy:   [id number] [place], [place] [address]  [place] [address] Phone: [phone] Fax: [phone]  [place], [address] [place] [address] Phone: [phone] Fax: [phone]  [id number] [place], [address] [place] [address] Phone: [phone] Fax: [phone]  Social Determinants of Health (SDOH) Social History: [place]: No Food Insecurity (04/08/2022)  Housing: Low Risk  (04/08/2022)  Transportation Needs: No Transportation Needs (04/08/2022)  Utilities: Not At Risk (04/08/2022)  Alcohol Screen: Low Risk  (10/26/2021)  Depression (PHQ2-9): Low Risk  (03/02/2022)  Financial Resource Strain: Low Risk  (10/26/2021)  Physical Activity: Inactive (10/26/2021)  Social Connections: Moderately Integrated (10/26/2021)  Stress: No Stress Concern Present (10/26/2021)  Tobacco Use: Medium Risk (04/10/2022)   Readmission Risk Interventions    04/11/2022    3:24 PM  Readmission Risk Prevention Plan  Transportation Screening Complete  HRI or Home Care Consult Complete  Social Work Consult for [place] Planning/Counseling Complete  Palliative Care Screening Not Applicable  Medication Review Press photographer) Referral to Pharmacy

## 2022-04-12 DIAGNOSIS — I10 Essential (primary) hypertension: Secondary | ICD-10-CM | POA: Diagnosis not present

## 2022-04-12 DIAGNOSIS — E785 Hyperlipidemia, unspecified: Secondary | ICD-10-CM | POA: Diagnosis not present

## 2022-04-12 DIAGNOSIS — I4819 Other persistent atrial fibrillation: Secondary | ICD-10-CM | POA: Diagnosis not present

## 2022-04-12 LAB — BASIC METABOLIC PANEL
Anion gap: 14 (ref 5–15)
BUN: 28 mg/dL — ABNORMAL HIGH (ref 8–23)
CO2: 21 mmol/L — ABNORMAL LOW (ref 22–32)
Calcium: 8.6 mg/dL — ABNORMAL LOW (ref 8.9–10.3)
Chloride: 96 mmol/L — ABNORMAL LOW (ref 98–111)
Creatinine, Ser: 1.54 mg/dL — ABNORMAL HIGH (ref 0.61–1.24)
GFR, Estimated: 46 mL/min — ABNORMAL LOW (ref >60)
Glucose, Bld: 145 mg/dL — ABNORMAL HIGH (ref 70–99)
Potassium: 4 mmol/L (ref 3.5–5.1)
Sodium: 131 mmol/L — ABNORMAL LOW (ref 135–145)

## 2022-04-12 LAB — GLUCOSE, CAPILLARY
Glucose-Capillary: 132 mg/dL — ABNORMAL HIGH (ref 70–99)
Glucose-Capillary: 171 mg/dL — ABNORMAL HIGH (ref 70–99)
Glucose-Capillary: 125 mg/dL — ABNORMAL HIGH (ref 70–99)
Glucose-Capillary: 166 mg/dL — ABNORMAL HIGH (ref 70–99)

## 2022-04-12 MED ORDER — AMIODARONE HCL 200 MG PO TABS
200.0000 mg | ORAL_TABLET | Freq: Two times a day (BID) | ORAL | Status: DC
  Administered 2022-04-12 – 2022-04-13 (×3): 200 mg via ORAL
  Filled 2022-04-12 (×3): qty 1

## 2022-04-12 MED ORDER — CARVEDILOL 12.5 MG PO TABS
12.5000 mg | ORAL_TABLET | Freq: Two times a day (BID) | ORAL | Status: DC
  Administered 2022-04-12 – 2022-04-13 (×2): 12.5 mg via ORAL
  Filled 2022-04-12 (×2): qty 1

## 2022-04-12 MED ORDER — AMLODIPINE BESYLATE 10 MG PO TABS
10.0000 mg | ORAL_TABLET | Freq: Every day | ORAL | Status: DC
  Administered 2022-04-12 – 2022-04-13 (×2): 10 mg via ORAL
  Filled 2022-04-12 (×2): qty 1

## 2022-04-12 NOTE — Progress Notes (Signed)
Mobility Specialist Progress Note:   04/12/22 1125  Mobility  Activity Ambulated independently in hallway  Level of Assistance Independent  Assistive Device None  Distance Ambulated (ft) 500 ft  Activity Response Tolerated well  Mobility Referral Yes  $Mobility charge 1 Mobility   Pt agreeable to mobility session. Required no physical assistance throughout. HR 60s throughout, no SOB noted. Pt back in bed with all needs met.   Nelta Numbers Mobility Specialist Please contact via SecureChat or  Rehab office at [phone]

## 2022-04-12 NOTE — Care Management Important Message (Signed)
Important Message  Patient Details  Name: TROYCE SWEE MRN: [id number] Date of Birth: [birth year]   Medicare Important Message Given:  Yes     Shelda Altes 04/12/2022, 9:19 AM

## 2022-04-12 NOTE — Progress Notes (Addendum)
Rounding Note    Patient Name: Kyle Dunlap Date of Encounter: 04/12/2022  [place] Cardiologist: Jenkins Rouge, MD   Subjective   Much better today, no complaints.   Inpatient Medications    Scheduled Meds:  amiodarone  200 mg Oral BID   apixaban  5 mg Oral BID   aspirin EC  81 mg Oral Daily   carvedilol  12.5 mg Oral BID WC   DULoxetine  60 mg Oral Daily   hydrALAZINE  50 mg Oral Q8H   insulin aspart  0-5 Units Subcutaneous QHS   insulin aspart  0-9 Units Subcutaneous TID WC   insulin aspart  3 Units Subcutaneous TID WC   insulin glargine-yfgn  5 Units Subcutaneous QHS   levothyroxine  25 mcg Oral Q0600   nitroGLYCERIN  1 inch Topical Q6H   pantoprazole  40 mg Oral QHS   sodium chloride  1 spray Each Nare QHS   sodium chloride flush  3 mL Intravenous Q12H   sodium chloride flush  3 mL Intravenous Q12H   Continuous Infusions:  sodium chloride 50 mL/hr at 04/10/22 2104   sodium chloride     sodium chloride     PRN Meds: sodium chloride, sodium chloride, acetaminophen **OR** acetaminophen, bisacodyl, diazepam, fentaNYL (SUBLIMAZE) injection, hydrALAZINE, ondansetron **OR** ondansetron (ZOFRAN) IV, oxyCODONE, sodium chloride flush, sodium chloride flush, traZODone   Vital Signs    Vitals:   04/11/22 2153 04/12/22 0012 04/12/22 0434 04/12/22 0810  BP: (!) 126/57 120/61 (!) 157/63 (!) 160/61  Pulse: (!) 44 (!) 51 (!) 53 (!) 52  Resp: 18 18  18   Temp: 97.7 F (36.5 C) 97.8 F (36.6 C)  98.1 F (36.7 C)  TempSrc: Oral Oral  Oral  SpO2: 96% 97% 98% 94%  Weight:   97.6 kg   Height:        Intake/Output Summary (Last 24 hours) at 04/12/2022 1014 Last data filed at 04/12/2022 0900 Gross per 24 hour  Intake 854.63 ml  Output 975 ml  Net -120.37 ml      04/12/2022    4:34 AM 04/11/2022    5:09 AM 04/09/2022    5:08 AM  Last 3 Weights  Weight (lbs) 215 lb 2.7 oz 212 lb 205 lb 11 oz  Weight (kg) 97.6 kg 96.163 kg 93.3 kg      Telemetry     Sinus bradycardia, prolonged QT appears around 500 on telemetry - Personally Reviewed  ECG    No new tracing  Physical Exam   GEN: No acute distress.   Neck: No JVD Cardiac: RRR, no murmurs, rubs, or gallops.  Respiratory: Clear to auscultation bilaterally. GI: Soft, nontender, non-distended  MS: No edema; No deformity. Neuro:  Nonfocal  Psych: Normal affect   Labs    High Sensitivity Troponin:   Recent Labs  Lab 04/08/22 1305 04/08/22 2322 04/09/22 0304 04/09/22 0621 04/09/22 0831  TROPONINIHS 37* 28* 24* 23* 22*     Chemistry Recent Labs  Lab 04/08/22 1507 04/09/22 0304 04/09/22 1448 04/10/22 0212 04/11/22 0152 04/12/22 0205  NA  --  129*  --  130* 130* 131*  K  --  4.0  --  3.8 4.0 4.0  CL  --  98  --  100 102 96*  CO2  --  23  --  24 20* 21*  GLUCOSE  --  143*  --  145* 184* 145*  BUN  --  26*  --  25* 23  28*  CREATININE  --  1.28*   < > 1.39* 1.29* 1.54*  CALCIUM  --  8.1*  --  8.1* 8.1* 8.6*  MG 1.8 2.7*  --  2.2  --   --   GFRNONAA  --  57*   < > 52* 57* 46*  ANIONGAP  --  8  --  6 8 14    < > = values in this interval not displayed.    Lipids  Recent Labs  Lab 04/09/22 0304  CHOL 128  TRIG 120  HDL 63  LDLCALC 41  CHOLHDL 2.0    Hematology Recent Labs  Lab 04/09/22 0304 04/09/22 1448 04/10/22 0212  WBC 9.8 8.5 7.6  RBC 3.66* 3.28* 3.10*  HGB 11.1* 10.0* 9.5*  HCT 33.1* 29.7* 27.9*  MCV 90.4 90.5 90.0  MCH 30.3 30.5 30.6  MCHC 33.5 33.7 34.1  RDW 13.2 13.1 13.2  PLT 273 256 243   Thyroid  Recent Labs  Lab 04/08/22 1507 04/09/22 0910  TSH 5.147*  --   FREET4  --  1.31*    BNP Recent Labs  Lab 04/09/22 0304  BNP 180.0*    DDimer No results for input(s): "DDIMER" in the last 168 hours.   Radiology    DG CHEST PORT 1 VIEW  Result Date: 04/11/2022 CLINICAL DATA:  Shortness of breath EXAM: PORTABLE CHEST 1 VIEW COMPARISON:  04/09/2022 FINDINGS: Cardiac shadow is stable. Aortic calcifications are again seen. Minimal  basilar atelectasis is again noted. No sizable effusion is seen. No bony abnormality is noted. IMPRESSION: Minimal basilar atelectasis stable from the prior exam. Electronically Signed   By: Inez Catalina M.D.   On: 04/11/2022 10:21    Cardiac Studies   Cath: 04/09/2022     Mid LM to Dist LM lesion is 35% stenosed.   Prox RCA lesion is 45% stenosed.   LVEDP of 23 mmHg.   There is no aortic valve stenosis.   For the most part, angiographically minimal CAD with the exception of moderate lesions in the distal Left Main 35% and proximal RCA 45% Moderately elevated LVEDP of 23 mmHg.  (Preserved EF on Echo)   Glenetta Hew, MD   Echo: 04/09/2022   IMPRESSIONS     1. Left ventricular ejection fraction, by estimation, is 70 to 75%. The  left ventricle has hyperdynamic function. Left ventricular endocardial  border not optimally defined to evaluate regional wall motion. Left  ventricular diastolic parameters are  indeterminate.   2. Right ventricular systolic function is normal. The right ventricular  size is normal.   3. Left atrial size was moderately dilated.   4. The mitral valve is normal in structure. Trivial mitral valve  regurgitation. No evidence of mitral stenosis.   5. The aortic valve is tricuspid. Aortic valve regurgitation is not  visualized. No aortic stenosis is present.   Comparison(s): No prior Echocardiogram.   FINDINGS   Left Ventricle: Left ventricular ejection fraction, by estimation, is 70  to 75%. The left ventricle has hyperdynamic function. Left ventricular  endocardial border not optimally defined to evaluate regional wall motion.  The left ventricular internal  cavity size was normal in size. There is no left ventricular hypertrophy.  Left ventricular diastolic parameters are indeterminate.   Right Ventricle: The right ventricular size is normal. No increase in  right ventricular wall thickness. Right ventricular systolic function is  normal.   Left  Atrium: Left atrial size was moderately dilated.   Right Atrium: Right  atrial size was normal in size.   Pericardium: Trivial pericardial effusion is present.   Mitral Valve: The mitral valve is normal in structure. Trivial mitral  valve regurgitation. No evidence of mitral valve stenosis.   Tricuspid Valve: The tricuspid valve is normal in structure. Tricuspid  valve regurgitation is not demonstrated. No evidence of tricuspid  stenosis.   Aortic Valve: The aortic valve is tricuspid. Aortic valve regurgitation is  not visualized. No aortic stenosis is present.   Pulmonic Valve: The pulmonic valve was not well visualized. Pulmonic valve  regurgitation is not visualized. No evidence of pulmonic stenosis.   Aorta: The aortic root and ascending aorta are structurally normal, with  no evidence of dilitation.   Venous: The inferior vena cava was not well visualized.   IAS/Shunts: The interatrial septum was not well visualized.   Patient Profile     77 y.o. 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 Wynetta Emery.   Assessment & Plan    SVT Atrial fibrillation -- Found to be in SVT which was slowed with adenosine and placed on diltiazem drip.  This was in the setting of steroid injection and inhalers in the setting of recent bronchitis. Diltiazem was weaned and transition to carvedilol.  -- the morning of 3/19 he developed narrow complex tachycardia, followed but clear atrial fibrillation. EP consulted with recommendations for amiodarone short term with bridge to ablation.  -- remains in SR -- will transition from IV to PO amiodarone 200mg  BID today (QT slightly prolonged) x7 days, then drop to 200mg  daily  -- drop coreg rto 12.5mg  BID with bradycardia and amio use -- continue Eliquis 5mg  BID -- of note, primidone stopped in the setting of interaction with Eliquis   Pulmonary  Edema Hypoxic respiratory failure -- desat to 69% the morning of 3/20 with respiratory distress. Sats improved with NRB -- LVEDP was mildly elevated on cath, net + >2L -- treated with IV lasix with much improvement, weaned back to RA   Chest pain -- Initially presented with chest pain which woke him from sleep and radiated into his neck and jaw.  This was in the setting of SVT.  Given concern for ACS he was transferred to [facility] underwent cardiac catheterization noted above with minimal CAD.  Recommendations for continued medical therapy.   HTN -- improving  -- continue coreg 12.5mg  BID, hydralazine 50mg  TID, add norvasc 10mg  daily   HLD -- LDL 41, HDL 63 -- continue statin, continue Praluent at discharge   CKD stage 3a -- baseline 1.3-1.4, slight increase to 1.54 this morning, s/p IV lasix yesterday -- follow BMET   DM -- On SSI -- Hemoglobin A1c 6.9   Needs to ambulate today  For questions or updates, please contact [place] Please consult [email or web address] for contact info under        Signed, Reino Bellis, NP  04/12/2022, 10:14 AM    I have examined the patient and reviewed assessment and plan and discussed with patient.  Agree with above as stated.    DOing well today.  Maintaining NSR.  Switch to PO Amio.  He declined Tikosyn. Watch HR.  Due to bradycardia, decrease Coreg.  Breathing much improved.  Watch renal function. Possible discharge in AM. Has EO f/u for consideration of ablation if medical therapy fails.   Larae Grooms

## 2022-04-13 ENCOUNTER — Other Ambulatory Visit (HOSPITAL_COMMUNITY): Payer: Self-pay

## 2022-04-13 DIAGNOSIS — E1121 Type 2 diabetes mellitus with diabetic nephropathy: Secondary | ICD-10-CM

## 2022-04-13 DIAGNOSIS — E785 Hyperlipidemia, unspecified: Secondary | ICD-10-CM | POA: Diagnosis not present

## 2022-04-13 DIAGNOSIS — I480 Paroxysmal atrial fibrillation: Secondary | ICD-10-CM | POA: Diagnosis not present

## 2022-04-13 DIAGNOSIS — I5032 Chronic diastolic (congestive) heart failure: Secondary | ICD-10-CM

## 2022-04-13 LAB — GLUCOSE, CAPILLARY: Glucose-Capillary: 135 mg/dL — ABNORMAL HIGH (ref 70–99)

## 2022-04-13 LAB — BASIC METABOLIC PANEL
Anion gap: 7 (ref 5–15)
BUN: 21 mg/dL (ref 8–23)
CO2: 24 mmol/L (ref 22–32)
Calcium: 8.5 mg/dL — ABNORMAL LOW (ref 8.9–10.3)
Chloride: 99 mmol/L (ref 98–111)
Creatinine, Ser: 1.32 mg/dL — ABNORMAL HIGH (ref 0.61–1.24)
GFR, Estimated: 55 mL/min — ABNORMAL LOW (ref >60)
Glucose, Bld: 154 mg/dL — ABNORMAL HIGH (ref 70–99)
Potassium: 3.8 mmol/L (ref 3.5–5.1)
Sodium: 130 mmol/L — ABNORMAL LOW (ref 135–145)

## 2022-04-13 MED ORDER — HYDRALAZINE HCL 25 MG PO TABS
75.0000 mg | ORAL_TABLET | Freq: Three times a day (TID) | ORAL | 11 refills | Status: DC
  Filled 2022-04-13: qty 270, 30d supply, fill #0

## 2022-04-13 MED ORDER — APIXABAN 5 MG PO TABS
5.0000 mg | ORAL_TABLET | Freq: Two times a day (BID) | ORAL | 11 refills | Status: DC
  Filled 2022-04-13: qty 60, 30d supply, fill #0

## 2022-04-13 MED ORDER — AMIODARONE HCL 200 MG PO TABS
ORAL_TABLET | ORAL | 7 refills | Status: DC
  Filled 2022-04-13: qty 60, 54d supply, fill #0

## 2022-04-13 MED ORDER — ASPIRIN 81 MG PO TBEC
81.0000 mg | DELAYED_RELEASE_TABLET | Freq: Every day | ORAL | 3 refills | Status: DC
  Filled 2022-04-13: qty 90, 90d supply, fill #0

## 2022-04-13 MED ORDER — AMLODIPINE BESYLATE 10 MG PO TABS
10.0000 mg | ORAL_TABLET | Freq: Every day | ORAL | 11 refills | Status: DC
  Filled 2022-04-13: qty 30, 30d supply, fill #0

## 2022-04-13 MED ORDER — CARVEDILOL 12.5 MG PO TABS
12.5000 mg | ORAL_TABLET | Freq: Two times a day (BID) | ORAL | 11 refills | Status: DC
  Filled 2022-04-13: qty 60, 30d supply, fill #0

## 2022-04-13 MED ORDER — TRAMADOL HCL 50 MG PO TABS: 50.0000 mg | ORAL_TABLET | Freq: Four times a day (QID) | ORAL | Status: DC | PRN

## 2022-04-13 MED ORDER — FUROSEMIDE 40 MG PO TABS
40.0000 mg | ORAL_TABLET | Freq: Every day | ORAL | 11 refills | Status: DC | PRN
  Filled 2022-04-13: qty 30, 30d supply, fill #0

## 2022-04-13 NOTE — Progress Notes (Signed)
Pt refused bipap at this time

## 2022-04-13 NOTE — Progress Notes (Signed)
Notified by CCMD of Afib. Patient asymptomatic resting in bed. EKG obtained. Irregular rhythm with heart rate 90s-120. Paged cardiology on-call.

## 2022-04-13 NOTE — Discharge Summary (Addendum)
Discharge Summary    Patient ID: Kyle Dunlap MRN: [id number]; DOB: [birth year]  Admit date: 04/08/2022 Discharge date: 04/13/2022  PCP:  Coral Spikes, [place] [facility] Cardiologist:  Jenkins Rouge, MD    Discharge Diagnoses    Principal Problem:   Paroxysmal SVT (supraventricular tachycardia) Active Problems:   GERD (gastroesophageal reflux disease)   Hyperlipidemia LDL goal <70   Essential hypertension, benign   Aortic atherosclerosis ([place])   Type 2 diabetes mellitus with diabetic nephropathy, without long-term current use of insulin (HCC)   Stage 3a chronic kidney disease (HCC)   Hypothyroidism   Carotid artery stenosis   SOB (shortness of breath)   Hyponatremia   Chest discomfort   PVD (peripheral vascular disease) ([place])   Unstable angina ([place])  Diagnostic Studies/Procedures    Cath: 04/09/2022     Mid LM to Dist LM lesion is 35% stenosed.   Prox RCA lesion is 45% stenosed.   LVEDP of 23 mmHg.   There is no aortic valve stenosis.   For the most part, angiographically minimal CAD with the exception of moderate lesions in the distal Left Main 35% and proximal RCA 45% Moderately elevated LVEDP of 23 mmHg.  (Preserved EF on Echo)   Glenetta Hew, MD   Echo: 04/09/2022   IMPRESSIONS     1. Left ventricular ejection fraction, by estimation, is 70 to 75%. The  left ventricle has hyperdynamic function. Left ventricular endocardial  border not optimally defined to evaluate regional wall motion. Left  ventricular diastolic parameters are  indeterminate.   2. Right ventricular systolic function is normal. The right ventricular  size is normal.   3. Left atrial size was moderately dilated.   4. The mitral valve is normal in structure. Trivial mitral valve  regurgitation. No evidence of mitral stenosis.   5. The aortic valve is tricuspid. Aortic valve regurgitation is not  visualized. No aortic stenosis is present.   Comparison(s): No prior  Echocardiogram.   FINDINGS   Left Ventricle: Left ventricular ejection fraction, by estimation, is 70  to 75%. The left ventricle has hyperdynamic function. Left ventricular  endocardial border not optimally defined to evaluate regional wall motion.  The left ventricular internal  cavity size was normal in size. There is no left ventricular hypertrophy.  Left ventricular diastolic parameters are indeterminate.   Right Ventricle: The right ventricular size is normal. No increase in  right ventricular wall thickness. Right ventricular systolic function is  normal.   Left Atrium: Left atrial size was moderately dilated.   Right Atrium: Right atrial size was normal in size.   Pericardium: Trivial pericardial effusion is present.   Mitral Valve: The mitral valve is normal in structure. Trivial mitral  valve regurgitation. No evidence of mitral valve stenosis.   Tricuspid Valve: The tricuspid valve is normal in structure. Tricuspid  valve regurgitation is not demonstrated. No evidence of tricuspid  stenosis.   Aortic Valve: The aortic valve is tricuspid. Aortic valve regurgitation is  not visualized. No aortic stenosis is present.   Pulmonic Valve: The pulmonic valve was not well visualized. Pulmonic valve  regurgitation is not visualized. No evidence of pulmonic stenosis.   Aorta: The aortic root and ascending aorta are structurally normal, with  no evidence of dilitation.   Venous: The inferior vena cava was not well visualized.   IAS/Shunts: The interatrial septum was not well visualized.    _____________   History of Present Illness  Kyle Dunlap is a 77 y.o. male with PMH of HTN, HLD, DM2, LCEA complicated by TIA [id number] on plavix who was seen 04/09/2022 for the evaluation of SVT while at [facility]. He came to the ED with chest pressure into his shoulders and shortness of breath that awakened him from sleep. He was found to be in SVT and slowed down with adenosine and  diltiazem but still having frequent runs SVT. Chest pain resolved-never had before but has some ST elevation inf/lat on EKG. Patient has been on diltiazem for years for palpitations. Coronary calcium score 42 03/2020. . Troponins flat 28,24,23, BNP 180, Mg 2.7(1.8 yest) Na 129, crt 1.28. was seen in urgent care Thurs prior to [facility] for cough and told he had bronchitis. Was taking albuterol inhaler and given IM decadron plus Corcidin HBP. Given concerns he was evaluated by cardiology and transferred to [facility] for further work up.   Hospital Course     Consultants: EP   SVT Atrial fibrillation -- Found to be in SVT which was slowed with adenosine and placed on diltiazem drip.  This was in the setting of steroid injection and inhalers in the setting of recent bronchitis. Diltiazem was weaned and transition to carvedilol.  -- the morning of 3/19 he developed narrow complex tachycardia, followed but clear atrial fibrillation. EP consulted with recommendations for amiodarone short term with bridge to ablation.  -- remains in SR -- was transitioned from IV to PO amiodarone 200mg  BID today (QT slightly prolonged) x7 days, then drop to 200mg  daily  -- dropped coreg to 12.5mg  BID with bradycardia and amio use -- continue Eliquis 5mg  BID -- of note, primidone stopped in the setting of interaction with Eliquis   Pulmonary Edema Hypoxic respiratory failure -- desat to 69% the morning of 3/20 with respiratory distress. Sats improved with NRB -- LVEDP was mildly elevated on cath, net + >2L -- treated with IV lasix with much improvement, weaned back to RA -- lasix 40mg  PRN at discharge, instructed on daily weights    Chest pain -- Initially presented with chest pain which woke him from sleep and radiated into his neck and jaw.  This was in the setting of SVT.  Given concern for ACS he was transferred to [facility] underwent cardiac catheterization noted above with minimal CAD.   -- Recommendations for  continued medical therapy.   HTN -- improving  -- continue coreg 12.5mg  BID, hydralazine 75mg  TID, norvasc 10mg  daily -- asked to keep a log of BPs at discharge and bring to follow up   HLD -- LDL 41, HDL 63 -- continue statin, continue Praluent at discharge   CKD stage 3a -- baseline 1.3-1.4. Stable at 1.32  DM -- SSI while inpatient, resume glipizide at discharge -- Hemoglobin A1c 6.9  S/p L CEA complicated by TIA -- per neurology notes felt this was related to hypoperfusion post surgery -- per neuro/VVS, ok to stop plavix with need for Eliquis +ASA  General: Well developed, well nourished, male appearing in no acute distress. Head: Normocephalic, atraumatic.  Neck: Supple without bruits, JVD. Lungs:  Resp regular and unlabored, CTA. Heart: RRR, S1, S2, no S3, S4, or murmur; no rub. Abdomen: Soft, non-tender, non-distended with normoactive bowel sounds. No hepatomegaly. No rebound/guarding. No obvious abdominal masses. Extremities: No clubbing, cyanosis, edema. Distal pedal pulses are 2+ bilaterally. Right cath site stable without bruising or hematoma Neuro: Alert and oriented X 3. Moves all extremities spontaneously. Psych: Normal affect.   Did  the patient have an acute coronary syndrome (MI, NSTEMI, STEMI, etc) this admission?:  No                               Did the patient have a percutaneous coronary intervention (stent / angioplasty)?:  No.      The patient will be scheduled for a TOC follow up appointment in 10-14 days.  A message has been sent to the [facility] and Scheduling Pool at the office where the patient should be seen for follow up.  _____________  Discharge Vitals Blood pressure (!) 174/75, pulse 64, temperature 98.5 F (36.9 C), temperature source Oral, resp. rate 16, height 5\' 9"  (1.753 m), weight 96.1 kg, SpO2 95 %.  Filed Weights   04/11/22 0509 04/12/22 0434 04/13/22 0543  Weight: 96.2 kg 97.6 kg 96.1 kg    Labs & Radiologic Studies     CBC No results for input(s): "WBC", "NEUTROABS", "HGB", "HCT", "MCV", "PLT" in the last 72 hours. Basic Metabolic Panel Recent Labs    04/12/22 0205 04/13/22 0251  NA 131* 130*  K 4.0 3.8  CL 96* 99  CO2 21* 24  GLUCOSE 145* 154*  BUN 28* 21  CREATININE 1.54* 1.32*  CALCIUM 8.6* 8.5*   Liver Function Tests No results for input(s): "AST", "ALT", "ALKPHOS", "BILITOT", "PROT", "ALBUMIN" in the last 72 hours. No results for input(s): "LIPASE", "AMYLASE" in the last 72 hours. High Sensitivity Troponin:   Recent Labs  Lab 04/08/22 1305 04/08/22 2322 04/09/22 0304 04/09/22 0621 04/09/22 0831  TROPONINIHS 37* 28* 24* 23* 22*    BNP Invalid input(s): "POCBNP" D-Dimer No results for input(s): "DDIMER" in the last 72 hours. Hemoglobin A1C No results for input(s): "HGBA1C" in the last 72 hours. Fasting Lipid Panel No results for input(s): "CHOL", "HDL", "LDLCALC", "TRIG", "CHOLHDL", "LDLDIRECT" in the last 72 hours. Thyroid Function Tests No results for input(s): "TSH", "T4TOTAL", "T3FREE", "THYROIDAB" in the last 72 hours.  Invalid input(s): "FREET3" _____________  DG CHEST PORT 1 VIEW  Result Date: 04/11/2022 CLINICAL DATA:  Shortness of breath EXAM: PORTABLE CHEST 1 VIEW COMPARISON:  04/09/2022 FINDINGS: Cardiac shadow is stable. Aortic calcifications are again seen. Minimal basilar atelectasis is again noted. No sizable effusion is seen. No bony abnormality is noted. IMPRESSION: Minimal basilar atelectasis stable from the prior exam. Electronically Signed   By: Inez Catalina M.D.   On: 04/11/2022 10:21   CARDIAC CATHETERIZATION  Result Date: 04/09/2022   Mid LM to Dist LM lesion is 35% stenosed.   Prox RCA lesion is 45% stenosed.   LVEDP of 23 mmHg.   There is no aortic valve stenosis. For the most part, angiographically minimal CAD with the exception of moderate lesions in the distal Left Main 35% and proximal RCA 45% Moderately elevated LVEDP of 23 mmHg.  (Preserved EF on  Echo) Glenetta Hew, MD  ECHOCARDIOGRAM COMPLETE  Result Date: 04/09/2022    ECHOCARDIOGRAM REPORT   Patient Name:   Kyle Dunlap Date of Exam: 04/09/2022 Medical Rec #:  [id number]          Height:       69.0 in Accession #:    [id number]         Weight:       205.7 lb Date of Birth:  May 04, 1945           BSA:          2.091  m Patient Age:    77 years           BP:           144/94 mmHg Patient Gender: M                  HR:           66 bpm. Exam Location:  [facility] Procedure: 2D Echo, Color Doppler and Cardiac Doppler Indications:    Atrial Fibrillation I48.91  History:        Patient has no prior history of Echocardiogram examinations.                 Carotid Disease, Arrythmias:Tachycardia and Atrial Fibrillation,                 Signs/Symptoms:Murmur; Risk Factors:Hypertension, Dyslipidemia                 and Diabetes.  Sonographer:    Greer Pickerel Referring Phys: [id number] Murlean Iba  Sonographer Comments: No subcostal window. Image acquisition challenging due to patient body habitus and Image acquisition challenging due to respiratory motion. IMPRESSIONS  1. Left ventricular ejection fraction, by estimation, is 70 to 75%. The left ventricle has hyperdynamic function. Left ventricular endocardial border not optimally defined to evaluate regional wall motion. Left ventricular diastolic parameters are indeterminate.  2. Right ventricular systolic function is normal. The right ventricular size is normal.  3. Left atrial size was moderately dilated.  4. The mitral valve is normal in structure. Trivial mitral valve regurgitation. No evidence of mitral stenosis.  5. The aortic valve is tricuspid. Aortic valve regurgitation is not visualized. No aortic stenosis is present. Comparison(s): No prior Echocardiogram. FINDINGS  Left Ventricle: Left ventricular ejection fraction, by estimation, is 70 to 75%. The left ventricle has hyperdynamic function. Left ventricular endocardial border not optimally defined  to evaluate regional wall motion. The left ventricular internal cavity size was normal in size. There is no left ventricular hypertrophy. Left ventricular diastolic parameters are indeterminate. Right Ventricle: The right ventricular size is normal. No increase in right ventricular wall thickness. Right ventricular systolic function is normal. Left Atrium: Left atrial size was moderately dilated. Right Atrium: Right atrial size was normal in size. Pericardium: Trivial pericardial effusion is present. Mitral Valve: The mitral valve is normal in structure. Trivial mitral valve regurgitation. No evidence of mitral valve stenosis. Tricuspid Valve: The tricuspid valve is normal in structure. Tricuspid valve regurgitation is not demonstrated. No evidence of tricuspid stenosis. Aortic Valve: The aortic valve is tricuspid. Aortic valve regurgitation is not visualized. No aortic stenosis is present. Pulmonic Valve: The pulmonic valve was not well visualized. Pulmonic valve regurgitation is not visualized. No evidence of pulmonic stenosis. Aorta: The aortic root and ascending aorta are structurally normal, with no evidence of dilitation. Venous: The inferior vena cava was not well visualized. IAS/Shunts: The interatrial septum was not well visualized.  LEFT VENTRICLE PLAX 2D LVIDd:         4.20 cm   Diastology LVIDs:         2.50 cm   LV e' medial:    8.24 cm/s LV PW:         1.10 cm   LV E/e' medial:  14.4 LV IVS:        1.10 cm   LV e' lateral:   14.10 cm/s LVOT diam:     1.70 cm   LV E/e' lateral: 8.4 LV SV:  53 LV SV Index:   25 LVOT Area:     2.27 cm  RIGHT VENTRICLE RV S prime:     9.32 cm/s TAPSE (M-mode): 1.6 cm LEFT ATRIUM              Index        RIGHT ATRIUM           Index LA diam:        4.10 cm  1.96 cm/m   RA Area:     11.40 cm LA Vol (A2C):   108.0 ml 51.66 ml/m  RA Volume:   22.40 ml  10.71 ml/m LA Vol (A4C):   86.3 ml  41.28 ml/m LA Biplane Vol: 99.1 ml  47.40 ml/m  AORTIC VALVE LVOT Vmax:    125.00 cm/s LVOT Vmean:  75.500 cm/s LVOT VTI:    0.233 m  AORTA Ao Root diam: 3.40 cm Ao Asc diam:  3.30 cm MITRAL VALVE                TRICUSPID VALVE MV Area (PHT): 3.42 cm     TR Peak grad:   28.3 mmHg MV Decel Time: 222 msec     TR Vmax:        266.00 cm/s MV E velocity: 119.00 cm/s MV A velocity: 91.30 cm/s   SHUNTS MV E/A ratio:  1.30         Systemic VTI:  0.23 m                             Systemic Diam: 1.70 cm Vishnu Priya Mallipeddi Electronically signed by Lorelee Cover Mallipeddi Signature Date/Time: 04/09/2022/1:15:12 PM    Final    Portable chest 1 View  Result Date: 04/09/2022 CLINICAL DATA:  Shortness of breath. EXAM: PORTABLE CHEST 1 VIEW COMPARISON:  04/08/2022. FINDINGS: Heart is enlarged and the mediastinal contour is within normal limits. There is atherosclerotic calcification of the aorta. Lung volumes are low with mild atelectasis at the lung bases. No effusion or pneumothorax. No acute osseous abnormality. IMPRESSION: Low lung volumes with mild atelectasis at the lung bases. Electronically Signed   By: Brett Fairy M.D.   On: 04/09/2022 04:15   DG Chest Port 1 View  Result Date: 04/08/2022 CLINICAL DATA:  Rapid heart rate. Chest pain and shortness of breath. Dizziness and weakness. EXAM: PORTABLE CHEST 1 VIEW COMPARISON:  04/03/2012 FINDINGS: Heart size and mediastinal contours are stable. Aortic atherosclerosis. Lung volumes are low. Blunting of the left costophrenic angle noted, similar to previous exam. No signs of interstitial edema or airspace consolidation. IMPRESSION: 1. Low lung volumes. 2. Persistent blunting of the left costophrenic angle compatible with small effusion. Electronically Signed   By: Kerby Moors M.D.   On: 04/08/2022 11:41   DG Chest 2 View  Result Date: 04/04/2022 CLINICAL DATA:  Chest tightness with shortness of breath for 3 days. EXAM: CHEST - 2 VIEW COMPARISON:  Cardiac CT 04/01/2020.  Chest radiographs 03/26/2010. FINDINGS: The heart size and  mediastinal contours are stable. There is aortic atherosclerosis. Compared with the prior study, there is increased central airway and fissural thickening. No edema, confluent airspace opacity, pleural effusion or pneumothorax. There are stable mild degenerative changes in the spine. Cholecystectomy clips are noted. IMPRESSION: Increased central airway and fissural thickening compared with prior study, suggesting bronchitis. No evidence of pneumonia or edema. Follow-up as clinically warranted. Electronically Signed   By: Richardean Sale  M.D.   On: 04/04/2022 15:41   Disposition   Pt is being discharged home today in good condition.  Follow-up Plans & Appointments     Follow-up Information     Shirley Friar, PA-C Follow up on 04/27/2022.   Specialty: Cardiology Why: at 12pm for your follow up appt Contact information: [place] [id number] [phone]         Josue Hector, MD Follow up on 05/14/2022.   Specialty: Cardiology Why: at 1:15pm for your follow up appt Contact information: [address] [place]  [address] [phone]                Discharge Instructions     Call MD for:  difficulty breathing, headache or visual disturbances   Complete by: As directed    Call MD for:  persistant dizziness or light-headedness   Complete by: As directed    Call MD for:  redness, tenderness, or signs of infection (pain, swelling, redness, odor or green/yellow discharge around incision site)   Complete by: As directed    Diet - low sodium heart healthy   Complete by: As directed    Discharge instructions   Complete by: As directed    Radial Site Care Refer to this sheet in the next few weeks. These instructions provide you with information on caring for yourself after your procedure. Your caregiver may also give you more specific instructions. Your treatment has been planned according to current medical practices, but problems sometimes  occur. Call your caregiver if you have any problems or questions after your procedure. HOME CARE INSTRUCTIONS You may shower the day after the procedure. Remove the bandage (dressing) and gently wash the site with plain soap and water. Gently pat the site dry.  Do not apply powder or lotion to the site.  Do not submerge the affected site in water for 3 to 5 days.  Inspect the site at least twice daily.  Do not flex or bend the affected arm for 24 hours.  No lifting over 5 pounds (2.3 kg) for 5 days after your procedure.  Do not drive home if you are discharged the same day of the procedure. Have someone else drive you.  You may drive 24 hours after the procedure unless otherwise instructed by your caregiver.  What to expect: Any bruising will usually fade within 1 to 2 weeks.  Blood that collects in the tissue (hematoma) may be painful to the touch. It should usually decrease in size and tenderness within 1 to 2 weeks.  SEEK IMMEDIATE MEDICAL CARE IF: You have unusual pain at the radial site.  You have redness, warmth, swelling, or pain at the radial site.  You have drainage (other than a small amount of blood on the dressing).  You have chills.  You have a fever or persistent symptoms for more than 72 hours.  You have a fever and your symptoms suddenly get worse.  Your arm becomes pale, cool, tingly, or numb.  You have heavy bleeding from the site. Hold pressure on the site.   If you notice any bleeding such as blood in stool, black tarry stools, blood in urine, nosebleeds or any other unusual bleeding, call your doctor immediately. It is not normal to have this kind of bleeding while on a blood thinner and usually indicates there is an underlying problem with one of your body systems that needs to be checked out.   Increase activity slowly  Complete by: As directed         Discharge Medications   Allergies as of 04/13/2022       Reactions   Atorvastatin Other (See Comments)    Joint and muscle aches, weakness present   Zetia [ezetimibe] Other (See Comments)   Elevated liver enzymes   Zocor [simvastatin] Other (See Comments)   Joint and muscle aches   Codeine Other (See Comments)   Passed out        Medication List     STOP taking these medications    clopidogrel 75 MG tablet Commonly known as: PLAVIX   primidone 50 MG tablet Commonly known as: MYSOLINE   Tiadylt ER 360 MG 24 hr capsule Generic drug: diltiazem       TAKE these medications    acetaminophen 325 MG tablet Commonly known as: TYLENOL Take 650 mg by mouth every 6 (six) hours as needed for moderate pain.   albuterol 108 (90 Base) MCG/ACT inhaler Commonly known as: VENTOLIN HFA TAKE 2 PUFFS BY MOUTH EVERY 6 HOURS AS NEEDED FOR WHEEZE OR SHORTNESS OF BREATH What changed:  how much to take how to take this when to take this reasons to take this additional instructions   amiodarone 200 MG tablet Commonly known as: PACERONE Take one tablet twice daily for 6 days then take 1 tablet daily starting 04/19/22   amLODipine 10 MG tablet Commonly known as: NORVASC Take 1 tablet (10 mg total) by mouth daily.   apixaban 5 MG Tabs tablet Commonly known as: ELIQUIS Take 1 tablet (5 mg total) by mouth 2 (two) times daily.   aspirin EC 81 MG tablet Take 1 tablet (81 mg total) by mouth daily. Swallow whole.   b complex vitamins capsule Take 1 capsule by mouth once a week.   carvedilol 12.5 MG tablet Commonly known as: COREG Take 1 tablet (12.5 mg total) by mouth 2 (two) times daily with a meal.   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   diclofenac Sodium 1 % Gel Commonly known as: VOLTAREN Apply 2 g topically daily as needed (pain).   DULoxetine 60 MG capsule Commonly known as: CYMBALTA Take 1 capsule (60 mg total) by mouth  daily.   furosemide 40 MG tablet Commonly known as: Lasix Take 1 tablet (40 mg total) by mouth daily as needed. For weight gain of 3 pounds or more in 24 hours or 5 pounds in one week   glipiZIDE 5 MG tablet Commonly known as: GLUCOTROL Take 1 tablet (5 mg total) by mouth 2 (two) times daily before a meal.   hydrALAZINE 25 MG tablet Commonly known as: APRESOLINE Take 3 tablets (75 mg total) by mouth 3 (three) times daily. What changed: how much to take   levothyroxine 25 MCG tablet Commonly known as: SYNTHROID Take 25 mcg by mouth daily before breakfast.   multivitamin with minerals Tabs tablet Take 1 tablet by mouth daily.   pantoprazole 40 MG tablet Commonly known as: PROTONIX TAKE 1 TABLET BY MOUTH EVERY DAY   Praluent 150 MG/ML [place] Generic drug: Alirocumab INJECT 1 PEN INTO THE SKIN EVERY 14 (FOURTEEN) DAYS.   sodium chloride 0.65 % Soln nasal spray Commonly known as: OCEAN Place 1 spray into both nostrils at bedtime.   timolol 0.5 % ophthalmic solution Commonly known as: BETIMOL Place 1 drop into  both eyes daily.   traMADol 50 MG tablet Commonly known as: Ultram Take 1 tablet (50 mg total) by mouth every 6 (six) hours as needed for moderate pain.   vitamin C 1000 MG tablet Take 1,000 mg by mouth 3 (three) times a week.   Vitamin D3 125 MCG (5000 UT) capsule Take 5,000 Units by mouth 2 (two) times a week.         Outstanding Labs/Studies   BMET at follow up appt  Duration of Discharge Encounter   Greater than 30 minutes including physician time.  Signed, Reino Bellis, NP 04/13/2022, 10:11 AM  I have examined the patient and reviewed assessment and plan and discussed with patient.  Agree with above as stated.    GEN: Well nourished, well developed, in no acute distress  HEENT: normal  Neck: no JVD, carotid bruits, or masses Cardiac: RRR; no murmurs, rubs, or gallops,no edema  Respiratory:  clear to auscultation bilaterally, normal work of  breathing GI: soft, nontender, nondistended,  MS: no deformity or atrophy  Skin: warm and dry, no rash Neuro:  Strength and sensation are intact Psych: euthymic mood, full affect  Doing well today.  Maintaining sinus rhythm on amiodarone.  Lower dose of carvedilol to prevent bradycardia.  Will also send home on Lasix 40 mg daily given his episode of acute diastolic heart failure in the hospital in which he ended up on a facemask.  I asked him to weigh himself at home.  His wife is already familiar with using diuretics and adjusting the dose based on weight.  He is comfortable with this as well.  Plan for discharge later today.  He has follow-up with electrophysiology to further decide whether or not ablation should be considered.  Hopefully, as his amiodarone loads, the A-fib episodes will be less frequent.  Larae Grooms

## 2022-04-15 ENCOUNTER — Encounter: Payer: Self-pay | Admitting: Family Medicine

## 2022-04-16 ENCOUNTER — Encounter: Payer: Self-pay | Admitting: Neurology

## 2022-04-16 NOTE — Telephone Encounter (Signed)
Thersa Salt G, DO     Needs to be seen.

## 2022-04-19 ENCOUNTER — Ambulatory Visit (HOSPITAL_COMMUNITY): Admission: RE | Admit: 2022-04-19 | Payer: Medicare Other | Source: Ambulatory Visit

## 2022-04-26 ENCOUNTER — Encounter: Payer: Self-pay | Admitting: Cardiology

## 2022-04-26 ENCOUNTER — Encounter: Payer: Self-pay | Admitting: Family Medicine

## 2022-04-27 ENCOUNTER — Ambulatory Visit: Payer: Medicare Other | Attending: Student | Admitting: Student

## 2022-04-27 ENCOUNTER — Encounter: Payer: Self-pay | Admitting: Student

## 2022-04-27 VITALS — BP 136/68 | HR 67 | Ht 68.5 in | Wt 200.6 lb

## 2022-04-27 DIAGNOSIS — I480 Paroxysmal atrial fibrillation: Secondary | ICD-10-CM | POA: Diagnosis not present

## 2022-04-27 DIAGNOSIS — E039 Hypothyroidism, unspecified: Secondary | ICD-10-CM | POA: Diagnosis not present

## 2022-04-27 DIAGNOSIS — I10 Essential (primary) hypertension: Secondary | ICD-10-CM | POA: Diagnosis not present

## 2022-04-27 NOTE — Progress Notes (Signed)
  Electrophysiology Office Note:   Date:  04/27/2022  ID:  Kyle Dunlap, DOB September 20, 1945, MRN [id number]  Primary Cardiologist: Charlton Haws, MD Electrophysiologist: Regan Lemming, MD   History of Present Illness:   Kyle Dunlap is a 77 y.o. male with h/o paroxysmal AF, HLD, DM2, LCEA complicated by TIA 10/2021 on plavix  seen today for post hospital follow up.    Admitted 3/18 - 3/22 for chest pressure and SOB and was found to be having frequent runs of NSVT. Cath as below without target lesion, and echo with normal EF. He converted on diltiazem, but then went into AF and EP was consulted. Started on po amidoarone as bridge to ablation, and pt again converted to NSR.   Since discharge from hospital the patient reports doing well. He had a brief episode of HR in the 120s this am that lasted for a couple of hours, but otherwise has had no issues. he denies chest pain, dyspnea, PND, orthopnea, nausea, vomiting, dizziness, syncope, edema, weight gain, or early satiety.   Review of systems complete and found to be negative unless listed in HPI.   Studies Reviewed:    EKG is ordered today. Personal review shows NSR at 67 bpm  Cath: 04/09/2022     Mid LM to Dist LM lesion is 35% stenosed.   Prox RCA lesion is 45% stenosed.   LVEDP of 23 mmHg.   There is no aortic valve stenosis.    Echo: 04/09/2022  1. Left ventricular ejection fraction, by estimation, is 70 to 75%. The left ventricle has hyperdynamic function. Left ventricular endocardial border not optimally defined to evaluate regional wall motion. Left  ventricular diastolic parameters are indeterminate.   2. Right ventricular systolic function is normal. The right ventricular size is normal.   3. Left atrial size was moderately dilated.   4. The mitral valve is normal in structure. Trivial mitral valve regurgitation. No evidence of mitral stenosis.   5. The aortic valve is tricuspid. Aortic valve regurgitation is not   visualized. No aortic stenosis is present.   Risk Assessment/Calculations:          Physical Exam:   VS:  BP 136/68   Pulse 67   Ht 5' 8.5" (1.74 m)   Wt 200 lb 9.6 oz (91 kg)   SpO2 95%   BMI 30.06 kg/m    Wt Readings from Last 3 Encounters:  04/27/22 200 lb 9.6 oz (91 kg)  04/13/22 211 lb 13.8 oz (96.1 kg)  03/26/22 208 lb (94.3 kg)     GEN: Well nourished, well developed in no acute distress NECK: No JVD; No carotid bruits CARDIAC: Regular rate and rhythm, no murmurs, rubs, gallops RESPIRATORY:  Clear to auscultation without rales, wheezing or rhonchi  ABDOMEN: Soft, non-tender, non-distended EXTREMITIES:  No edema; No deformity   ASSESSMENT AND PLAN:   Paroxysmal AF SVT Continue amiodarone 200 mg daily.  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  Sees Dr. Elberta Fortis next month to discuss and plan  CAD As above. Continue ASA and statin Not candidate for 1c AAD. Post ablation, could consider Tikosyn down the road if he has further AF s/p ablation and off amio.    Follow up with Dr. Elberta Fortis in 4 weeks as scheduled to discuss ablation  Signed, Graciella Freer, PA-C

## 2022-04-27 NOTE — Patient Instructions (Signed)
Medication Instructions:  Your physician recommends that you continue on your current medications as directed. Please refer to the Current Medication list given to you today.  *If you need a refill on your cardiac medications before your next appointment, please call your pharmacy*  Lab Work: CMET, TSH, FREE T4--TODAY If you have labs (blood work) drawn today and your tests are completely normal, you will receive your results only by: MyChart Message (if you have MyChart) OR A paper copy in the mail If you have any lab test that is abnormal or we need to change your treatment, we will call you to review the results.  Follow-Up: At [facility], you and your health needs are our priority.  As part of our continuing mission to provide you with exceptional heart care, we have created designated Provider Care Teams.  These Care Teams include your primary Cardiologist (physician) and Advanced Practice Providers (APPs -  Physician Assistants and Nurse Practitioners) who all work together to provide you with the care you need, when you need it.  Your next appointment:   05/28/2022 at 3:30 PM  Provider:   Loman Brooklyn, MD

## 2022-04-28 LAB — COMPREHENSIVE METABOLIC PANEL
ALT: 26 IU/L (ref 0–44)
AST: 23 IU/L (ref 0–40)
Albumin/Globulin Ratio: 1.5 (ref 1.2–2.2)
Albumin: 4 g/dL (ref 3.8–4.8)
Alkaline Phosphatase: 125 IU/L — ABNORMAL HIGH (ref 44–121)
BUN/Creatinine Ratio: 18 (ref 10–24)
BUN: 29 mg/dL — ABNORMAL HIGH (ref 8–27)
Bilirubin Total: 0.4 mg/dL (ref 0.0–1.2)
CO2: 22 mmol/L (ref 20–29)
Calcium: 9.1 mg/dL (ref 8.6–10.2)
Chloride: 96 mmol/L (ref 96–106)
Creatinine, Ser: 1.64 mg/dL — ABNORMAL HIGH (ref 0.76–1.27)
Globulin, Total: 2.7 g/dL (ref 1.5–4.5)
Glucose: 179 mg/dL — ABNORMAL HIGH (ref 70–99)
Potassium: 4.6 mmol/L (ref 3.5–5.2)
Sodium: 135 mmol/L (ref 134–144)
Total Protein: 6.7 g/dL (ref 6.0–8.5)
eGFR: 43 mL/min/{1.73_m2} — ABNORMAL LOW (ref >59)

## 2022-04-28 LAB — T4, FREE: Free T4: 1.89 ng/dL — ABNORMAL HIGH (ref 0.82–1.77)

## 2022-04-28 LAB — TSH: TSH: 3.66 u[IU]/mL (ref 0.450–4.500)

## 2022-04-30 NOTE — Progress Notes (Signed)
Electrophysiology Office Note:   Date:  05/14/2022  ID:  Kyle Shinhillip D Seat, DOB 11/29/1945, MRN [id number]  Primary Cardiologist: Charlton HawsPeter Vidya Bamford, MD Electrophysiologist: Regan LemmingWill Martin Camnitz, MD   History of Present Illness:   Kyle Dunlap is a 77 y.o. male with h/o paroxysmal AF, HLD, DM2, LCEA complicated by TIA 10/2021 on plavix     Admitted 3/18 - 04/13/22 for chest pressure and SOB and was found to be having frequent runs of SVT RX with cardizem and adenosine . Cath as below without target lesion ( 45% prox RCA and 35% LM ) , and echo with normal EF 70-75% . He converted on diltiazem, but then went into AF and EP was consulted. Started on po amidoarone as bridge to ablation, and pt again converted to NSR.   Since discharge from hospital the patient reports doing well.    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   Studies Reviewed:    EKG is ordered today. Personal review shows NSR at 67 bpm  Cath: 04/09/2022     Mid LM to Dist LM lesion is 35% stenosed.   Prox RCA lesion is 45% stenosed.   LVEDP of 23 mmHg.   There is no aortic valve stenosis.    Echo: 04/09/2022  1. Left ventricular ejection fraction, by estimation, is 70 to 75%. The left ventricle has hyperdynamic function. Left ventricular endocardial border not optimally defined to evaluate regional wall motion. Left  ventricular diastolic parameters are indeterminate.   2. Right ventricular systolic function is normal. The right ventricular size is normal.   3. Left atrial size was moderately dilated.   4. The mitral valve is normal in structure. Trivial mitral valve regurgitation. No evidence of mitral stenosis.   5. The aortic valve is  tricuspid. Aortic valve regurgitation is not  visualized. No aortic stenosis is present.   Risk Assessment/Calculations:          Physical Exam:   VS:  BP (!) 148/68   Pulse 73   Ht 5' 8.5" (1.74 m)   Wt 209 lb 9.6 oz (95.1 kg)   SpO2 96%   BMI 31.41 kg/m    Wt Readings from Last 3 Encounters:  05/14/22 209 lb 9.6 oz (95.1 kg)  04/27/22 200 lb 9.6 oz (91 kg)  04/13/22 211 lb 13.8 oz (96.1 kg)    Affect appropriate Healthy:  appears stated age HEENT: normal Neck post left CEA  JVP normal no bruits no thyromegaly Lungs clear with no wheezing and good diaphragmatic motion Heart:  S1/S2 SEM  murmur, no rub, gallop or click PMI normal Abdomen: benighn, BS positve, no tenderness, no AAA no bruit.  No HSM or HJR Distal pulses intact with no bruits No edema Neuro non-focal Skin warm and dry No muscular weakness   ASSESSMENT AND PLAN:   Paroxysmal AF SVT Continue amiodarone 200 mg daily.  TSH 3.6 04/27/22 normal AST/ALT  F/U Camnitz to discuss ablation   CAD As above. Continue ASA and statin Not candidate for 1c AAD. Post ablation, could consider Tikosyn down the road if he has further AF s/p ablation and off amio.   HLD:  intolerant to zetia and statins On  Praluent LDL 41 04/09/22   PV:  post left CEA Dr Sherral Hammers Duplex 01/17/22 plaque no residual stenosis   Murmur:   04/09/22 TTE with hyperdynamic EF no valve dx observe    Follow up with Dr. Elberta Fortis  May 6 as scheduled to discuss ablation  Signed, Charlton Haws, MD

## 2022-05-03 NOTE — Addendum Note (Signed)
Addended by: Lacy Duverney R on: 05/03/2022 05:13 PM   Modules accepted: Orders

## 2022-05-09 ENCOUNTER — Ambulatory Visit: Payer: Medicare Other | Admitting: Nurse Practitioner

## 2022-05-09 DIAGNOSIS — E782 Mixed hyperlipidemia: Secondary | ICD-10-CM

## 2022-05-09 DIAGNOSIS — E039 Hypothyroidism, unspecified: Secondary | ICD-10-CM

## 2022-05-09 DIAGNOSIS — N1831 Chronic kidney disease, stage 3a: Secondary | ICD-10-CM

## 2022-05-09 DIAGNOSIS — I10 Essential (primary) hypertension: Secondary | ICD-10-CM

## 2022-05-14 ENCOUNTER — Encounter: Payer: Self-pay | Admitting: Cardiovascular Disease

## 2022-05-14 ENCOUNTER — Ambulatory Visit: Payer: Medicare Other | Attending: Cardiovascular Disease | Admitting: Cardiovascular Disease

## 2022-05-14 VITALS — BP 148/68 | HR 73 | Ht 68.5 in | Wt 209.6 lb

## 2022-05-14 DIAGNOSIS — I480 Paroxysmal atrial fibrillation: Secondary | ICD-10-CM

## 2022-05-14 DIAGNOSIS — I10 Essential (primary) hypertension: Secondary | ICD-10-CM

## 2022-05-14 DIAGNOSIS — R0989 Other specified symptoms and signs involving the circulatory and respiratory systems: Secondary | ICD-10-CM | POA: Diagnosis not present

## 2022-05-14 DIAGNOSIS — E782 Mixed hyperlipidemia: Secondary | ICD-10-CM | POA: Diagnosis not present

## 2022-05-14 NOTE — Patient Instructions (Signed)
Medication Instructions:  Your physician recommends that you continue on your current medications as directed. Please refer to the Current Medication list given to you today.  *If you need a refill on your cardiac medications before your next appointment, please call your pharmacy*   Lab Work: NONE   If you have labs (blood work) drawn today and your tests are completely normal, you will receive your results only by: MyChart Message (if you have MyChart) OR A paper copy in the mail If you have any lab test that is abnormal or we need to change your treatment, we will call you to review the results.   Testing/Procedures: NONE    Follow-Up: At [facility], you and your health needs are our priority.  As part of our continuing mission to provide you with exceptional heart care, we have created designated Provider Care Teams.  These Care Teams include your primary Cardiologist (physician) and Advanced Practice Providers (APPs -  Physician Assistants and Nurse Practitioners) who all work together to provide you with the care you need, when you need it.  We recommend signing up for the patient portal called "MyChart".  Sign up information is provided on this After Visit Summary.  MyChart is used to connect with patients for Virtual Visits (Telemedicine).  Patients are able to view lab/test results, encounter notes, upcoming appointments, etc.  Non-urgent messages can be sent to your provider as well.   To learn more about what you can do with MyChart, go to [email or web address].    Your next appointment:   1 year(s)  Provider:   You may see Peter Nishan, MD or one of the following Advanced Practice Providers on your designated Care Team:   Brittany Strader, PA-C  Michele Lenze, PA-C     Other Instructions Thank you for choosing [facility]!    

## 2022-05-17 ENCOUNTER — Encounter: Payer: Self-pay | Admitting: Cardiovascular Disease

## 2022-05-21 ENCOUNTER — Other Ambulatory Visit: Payer: Self-pay | Admitting: Family Medicine

## 2022-05-21 DIAGNOSIS — H8103 Meniere's disease, bilateral: Secondary | ICD-10-CM

## 2022-05-24 ENCOUNTER — Encounter: Payer: Self-pay | Admitting: Nurse Practitioner

## 2022-05-24 ENCOUNTER — Ambulatory Visit: Payer: Medicare Other | Admitting: Nurse Practitioner

## 2022-05-24 VITALS — BP 136/60 | HR 65 | Ht 68.5 in | Wt 208.0 lb

## 2022-05-24 DIAGNOSIS — E1122 Type 2 diabetes mellitus with diabetic chronic kidney disease: Secondary | ICD-10-CM | POA: Diagnosis not present

## 2022-05-24 DIAGNOSIS — I10 Essential (primary) hypertension: Secondary | ICD-10-CM | POA: Diagnosis not present

## 2022-05-24 DIAGNOSIS — E782 Mixed hyperlipidemia: Secondary | ICD-10-CM | POA: Diagnosis not present

## 2022-05-24 DIAGNOSIS — E039 Hypothyroidism, unspecified: Secondary | ICD-10-CM | POA: Diagnosis not present

## 2022-05-24 DIAGNOSIS — N1831 Chronic kidney disease, stage 3a: Secondary | ICD-10-CM

## 2022-05-24 NOTE — Progress Notes (Signed)
Endocrinology Consult Note       05/24/2022, 4:15 PM   Subjective:    Patient ID: Kyle Dunlap, male    DOB: 1945-03-10.  Kyle Dunlap is being seen in consultation for management of currently uncontrolled symptomatic diabetes requested by  Tommie Sams, DO.   Past Medical History:  Diagnosis Date   Adenomatous colon polyp 11/22/2008   Dr. Jena Gauss   Allergy    Carotid artery stenosis    Chronic back pain    CKD (chronic kidney disease)    DDD (degenerative disc disease), lumbar    GERD (gastroesophageal reflux disease)    Heart murmur 2023   Hemorrhoids, external    High cholesterol    History of kidney stones 2013   HTN (hypertension)    Hyperplastic colon polyp 11/22/2008   Dr. Jena Gauss   Hypothyroidism    Kidney stone    "14 years ago" per pt   Meniere's disease    Meniere's disease    Pneumonia 1965   PTSD (post-traumatic stress disorder)    Tachyarrhythmia    Type 2 diabetes mellitus (HCC)     Past Surgical History:  Procedure Laterality Date   CATARACT EXTRACTION W/PHACO Left 09/28/2016   Procedure: CATARACT EXTRACTION PHACO AND INTRAOCULAR LENS PLACEMENT (IOC);  Surgeon: Fabio Pierce, MD;  Location: [facility];  Service: Ophthalmology;  Laterality: Left;  CDE: 4.40   CATARACT EXTRACTION W/PHACO Right 10/26/2016   Procedure: CATARACT EXTRACTION PHACO AND INTRAOCULAR LENS PLACEMENT RIGHT EYE;  Surgeon: Fabio Pierce, MD;  Location: [facility];  Service: Ophthalmology;  Laterality: Right;  CDE: 6.74   CHOLECYSTECTOMY     CHOLECYSTECTOMY     COLONOSCOPY  12/10/2008   Dr. Jena Gauss- L side diverticula, adenomatous polyp, hyperplastic polyp   COLONOSCOPY  11/16/2003   Dr. Karilyn Cota- performed exam to cecum,3 polyps- no path report available, external hemorrhoids,    COLONOSCOPY N/A 01/04/2014   Procedure: COLONOSCOPY;  Surgeon: Corbin Ade, MD;  Location: [facility];  Service: Endoscopy;   Laterality: N/A;  8:30am   COLONOSCOPY N/A 02/20/2017   Surgeon: Corbin Ade, MD; multiple colon polyps ranging 3 to 9 mm in size.  Pathology with tubular adenomas.  Recommended repeat in 3 years.   COLONOSCOPY WITH PROPOFOL N/A 10/03/2020   Procedure: COLONOSCOPY WITH PROPOFOL;  Surgeon: Corbin Ade, MD;  Location: [facility];  Service: Endoscopy;  Laterality: N/A;  8:30am   ENDARTERECTOMY Left 11/20/2021   Procedure: LEFT CAROTID ENDARTERECTOMY;  Surgeon: Victorino Sparrow, MD;  Location: [facility] OR;  Service: Vascular;  Laterality: Left;   ESOPHAGOGASTRODUODENOSCOPY  08/31/2011   [id number] esophageal erosions consistent with mild erosive reflux esophagitis   LEFT HEART CATH AND CORONARY ANGIOGRAPHY N/A 04/09/2022   Procedure: LEFT HEART CATH AND CORONARY ANGIOGRAPHY;  Surgeon: Marykay Lex, MD;  Location: [facility] INVASIVE CV LAB;  Service: Cardiovascular;  Laterality: N/A;   MOUTH SURGERY     POLYPECTOMY  10/03/2020   Procedure: POLYPECTOMY;  Surgeon: Corbin Ade, MD;  Location: [facility];  Service: Endoscopy;;    Social History   Socioeconomic History   Marital status: Married    Spouse name: Olegario Messier  Number of children: 2   Years of education: Not on file   Highest education level: Not on file  Occupational History   Occupation: retired    Comment: Personnel officer  Tobacco Use   Smoking status: Former    Packs/day: 2.00    Years: 24.00    Additional pack years: 0.00    Total pack years: 48.00    Types: Cigarettes    Quit date: 1985    Years since quitting: 39.3   Smokeless tobacco: Never  Vaping Use   Vaping Use: Never used  Substance and Sexual Activity   Alcohol use: Yes    Comment: occasionally- 4-5 mixed drinks per week.   Drug use: No   Sexual activity: Yes    Birth control/protection: None  Other Topics Concern   Not on file  Social History Narrative   Right handed    Retired   Chemical engineer Strain: Low Risk   (10/26/2021)   Overall Financial Resource Strain (CARDIA)    Difficulty of Paying Living Expenses: Not hard at all  Food Insecurity: No Food Insecurity (04/08/2022)   Hunger Vital Sign    Worried About Running Out of Food in the Last Year: Never true    Ran Out of Food in the Last Year: Never true  Transportation Needs: No Transportation Needs (04/08/2022)   PRAPARE - Administrator, Civil Service (Medical): No    Lack of Transportation (Non-Medical): No  Physical Activity: Inactive (10/26/2021)   Exercise Vital Sign    Days of Exercise per Week: 0 days    Minutes of Exercise per Session: 0 min  Stress: No Stress Concern Present (10/26/2021)   [employer] of Occupational Health - Occupational Stress Questionnaire    Feeling of Stress : Not at all  Social Connections: Moderately Integrated (10/26/2021)   Social Connection and Isolation Panel [NHANES]    Frequency of Communication with Friends and Family: Twice a week    Frequency of Social Gatherings with Friends and Family: Twice a week    Attends Religious Services: More than 4 times per year    Active Member of [employer] or Organizations: No    Attends Banker Meetings: Never    Marital Status: Married    Family History  Problem Relation Age of Onset   Lung cancer Mother    Heart attack Father    Colon cancer Brother 37   Alzheimer's disease Brother    Alzheimer's disease Brother    Alzheimer's disease Brother    Ankylosing spondylitis Child    Thyroid nodules Child     Outpatient Encounter Medications as of 05/24/2022  Medication Sig   acetaminophen (TYLENOL) 325 MG tablet Take 650 mg by mouth every 6 (six) hours as needed for moderate pain.   albuterol (VENTOLIN HFA) 108 (90 Base) MCG/ACT inhaler TAKE 2 PUFFS BY MOUTH EVERY 6 HOURS AS NEEDED FOR WHEEZE OR SHORTNESS OF BREATH (Patient taking differently: Inhale 1 puff into the lungs every 6 (six) hours as needed for shortness of breath.)   Alirocumab  (PRALUENT) 150 MG/ML SOAJ INJECT 1 PEN INTO THE SKIN EVERY 14 (FOURTEEN) DAYS.   amiodarone (PACERONE) 200 MG tablet Take one tablet twice daily for 6 days then take 1 tablet daily starting 04/19/22   amLODipine (NORVASC) 10 MG tablet Take 1 tablet (10 mg total) by mouth daily.   apixaban (ELIQUIS) 5 MG TABS tablet Take 1 tablet (5 mg total) by  mouth 2 (two) times daily.   Ascorbic Acid (VITAMIN C) 1000 MG tablet Take 1,000 mg by mouth 3 (three) times a week.   aspirin EC 81 MG tablet Take 1 tablet (81 mg total) by mouth daily. Swallow whole.   b complex vitamins capsule Take 1 capsule by mouth once a week.   carvedilol (COREG) 12.5 MG tablet Take 1 tablet (12.5 mg total) by mouth 2 (two) times daily with a meal.   Cholecalciferol (VITAMIN D3) 125 MCG (5000 UT) CAPS Take 5,000 Units by mouth 2 (two) times a week.   diazepam (VALIUM) 2 MG tablet Take 1 tablet (2 mg total) by mouth every 8 (eight) hours as needed for anxiety (For dizziness secondary to meniere's disease).   diclofenac Sodium (VOLTAREN) 1 % GEL Apply 2 g topically daily as needed (pain).   DULoxetine (CYMBALTA) 60 MG capsule Take 1 capsule (60 mg total) by mouth daily.   furosemide (LASIX) 40 MG tablet Take 1 tablet (40 mg total) by mouth daily as needed for weight gain of 3 pounds or more in 24 hours or 5 pounds in one week (Patient taking differently: Take 40 mg by mouth daily as needed. One half tablet (20 mg) twice daily.)   glipiZIDE (GLUCOTROL) 5 MG tablet Take 1 tablet (5 mg total) by mouth 2 (two) times daily before a meal.   hydrALAZINE (APRESOLINE) 25 MG tablet Take 3 tablets (75 mg total) by mouth 3 (three) times daily.   Multiple Vitamin (MULTIVITAMIN WITH MINERALS) TABS Take 1 tablet by mouth daily.   pantoprazole (PROTONIX) 40 MG tablet TAKE 1 TABLET BY MOUTH EVERY DAY (Patient taking differently: Take 40 mg by mouth daily.)   sodium chloride (OCEAN) 0.65 % SOLN nasal spray Place 1 spray into both nostrils at bedtime.    timolol (BETIMOL) 0.5 % ophthalmic solution Place 1 drop into both eyes daily.   traMADol (ULTRAM) 50 MG tablet Take 1 tablet (50 mg total) by mouth every 6 (six) hours as needed for moderate pain.   [DISCONTINUED] levothyroxine (SYNTHROID) 25 MCG tablet Take 25 mcg by mouth daily before breakfast.   [DISCONTINUED] diazepam (VALIUM) 2 MG tablet TAKE ONE TABLET (2MG  TOTAL) BY MOUTH DAILY AS NEEDED FOR DIZZINESS SECONDARY TO MENIERE'S DISEASE (Patient taking differently: Take 2 mg by mouth every 8 (eight) hours as needed for anxiety (For dizziness secondary to meniere's disease).)   No facility-administered encounter medications on file as of 05/24/2022.    ALLERGIES: Allergies  Allergen Reactions   Atorvastatin Other (See Comments)    Joint and muscle aches, weakness present   Zetia [Ezetimibe] Other (See Comments)    Elevated liver enzymes   Zocor [Simvastatin] Other (See Comments)    Joint and muscle aches   Codeine Other (See Comments)    Passed out    VACCINATION STATUS: Immunization History  Administered Date(s) Administered   Fluad Quad(high Dose 65+) 11/15/2020, 10/24/2021   H1N1 01/01/2008   Influenza,inj,Quad PF,6+ Mos 10/22/2014, 10/25/2015, 11/07/2016, 10/23/2017, 10/24/2018, 10/05/2019   Influenza-Unspecified 10/23/2010, 10/28/2012, 10/22/2013, 10/28/2019   Moderna Sars-Covid-2 Vaccination 03/09/2019, 04/06/2019, 11/17/2019   PFIZER(Purple Top)SARS-COV-2 Vaccination 02/17/2019, 03/10/2019, 10/22/2019   Pneumococcal Conjugate-13 03/15/2014   Pneumococcal Polysaccharide-23 09/22/2009   Td 07/17/2007   Tdap 09/29/2021   Zoster Recombinat (Shingrix) 03/10/2020   Zoster, Live 03/05/2013    Diabetes He presents for his follow-up diabetic visit. He has type 2 diabetes mellitus. Onset time: Diagnosed at approx age of 2. His disease course has been improving. There  are no hypoglycemic associated symptoms. Associated symptoms include fatigue and foot paresthesias. There are no  hypoglycemic complications. Symptoms are stable. Diabetic complications include nephropathy and peripheral neuropathy. Risk factors for coronary artery disease include diabetes mellitus, dyslipidemia, family history, male sex, hypertension and sedentary lifestyle. Current diabetic treatment includes oral agent (monotherapy). He is compliant with treatment most of the time. His weight is decreasing steadily. He is following a generally healthy diet. When asked about meal planning, he reported none. He has not had a previous visit with a dietitian. He rarely participates in exercise. His home blood glucose trend is decreasing steadily. His breakfast blood glucose range is generally 110-130 mg/dl. His bedtime blood glucose range is generally 180-200 mg/dl. (He presents today, accompanied by his wife, with his logs showing at goal fasting and slightly above target postprandial readings.  His most recent A1c, checked during recent hospitalization for Afib was 6.9%, improving from last visit of 7.8%.  He denies any hypoglycemia, has been doing well with his diet. ) An ACE inhibitor/angiotensin II receptor blocker is being taken. He sees a podiatrist.Eye exam is current.     Review of systems  Constitutional: + slowly decreasing body weight, current Body mass index is 31.17 kg/m., no fatigue, no subjective hyperthermia, no subjective hypothermia, HOH Eyes: no blurry vision, no xerophthalmia ENT: no sore throat, no nodules palpated in throat, no dysphagia/odynophagia, no hoarseness Cardiovascular: no chest pain, no shortness of breath, no palpitations, no leg swelling Respiratory: no cough, no shortness of breath Gastrointestinal: no nausea/vomiting/diarrhea Musculoskeletal: no muscle/joint aches Skin: no rashes, no hyperemia Neurological: no tremors, + numbness/tingling to BLE, no dizziness Psychiatric: no depression, no anxiety  Objective:     BP 136/60 (BP Location: Left Arm, Patient Position:  Sitting, Cuff Size: Large)   Pulse 65   Ht 5' 8.5" (1.74 m)   Wt 208 lb (94.3 kg)   BMI 31.17 kg/m   Wt Readings from Last 3 Encounters:  05/24/22 208 lb (94.3 kg)  05/14/22 209 lb 9.6 oz (95.1 kg)  04/27/22 200 lb 9.6 oz (91 kg)     BP Readings from Last 3 Encounters:  05/24/22 136/60  05/14/22 (!) 148/68  04/27/22 136/68     Physical Exam- Limited  Constitutional:  Body mass index is 31.17 kg/m. , not in acute distress, normal state of mind Eyes:  EOMI, no exophthalmos Musculoskeletal: no gross deformities, strength intact in all four extremities, no gross restriction of joint movements Skin:  no rashes, no hyperemia Neurological: no tremor with outstretched hands  Diabetic Foot Exam - Simple   No data filed     CMP ( most recent) CMP     Component Value Date/Time   NA 135 04/27/2022 1159   K 4.6 04/27/2022 1159   CL 96 04/27/2022 1159   CO2 22 04/27/2022 1159   GLUCOSE 179 (H) 04/27/2022 1159   GLUCOSE 154 (H) 04/13/2022 0251   BUN 29 (H) 04/27/2022 1159   CREATININE 1.64 (H) 04/27/2022 1159   CREATININE 1.22 03/15/2014 1054   CALCIUM 9.1 04/27/2022 1159   PROT 6.7 04/27/2022 1159   ALBUMIN 4.0 04/27/2022 1159   AST 23 04/27/2022 1159   ALT 26 04/27/2022 1159   ALKPHOS 125 (H) 04/27/2022 1159   BILITOT 0.4 04/27/2022 1159   GFRNONAA 55 (L) 04/13/2022 0251   GFRAA 48 (L) 01/01/2020 1101     Diabetic Labs (most recent): Lab Results  Component Value Date   HGBA1C 6.9 (H) 04/08/2022  HGBA1C 7.8 (H) 12/01/2021   HGBA1C 7.7 (H) 08/11/2021   MICROALBUR 60.2 (H) 03/15/2014     Lipid Panel ( most recent) Lipid Panel     Component Value Date/Time   CHOL 128 04/09/2022 0304   CHOL 203 (H) 08/11/2021 1352   TRIG 120 04/09/2022 0304   HDL 63 04/09/2022 0304   HDL 39 (L) 08/11/2021 1352   CHOLHDL 2.0 04/09/2022 0304   VLDL 24 04/09/2022 0304   LDLCALC 41 04/09/2022 0304   LDLCALC 121 (H) 08/11/2021 1352   LABVLDL 43 (H) 08/11/2021 1352      Lab  Results  Component Value Date   TSH 3.660 04/27/2022   TSH 5.147 (H) 04/08/2022   TSH 2.060 08/11/2021   FREET4 1.89 (H) 04/27/2022   FREET4 1.31 (H) 04/09/2022           Assessment & Plan:   1) Type 2 diabetes mellitus with stage 3a chronic kidney disease, without long-term current use of insulin (HCC)  He presents today, accompanied by his wife, with his logs showing at goal fasting and slightly above target postprandial readings.  His most recent A1c, checked during recent hospitalization for Afib was 6.9%, improving from last visit of 7.8%.  He denies any hypoglycemia, has been doing well with his diet.   - Kyle Dunlap has currently uncontrolled symptomatic type 2 DM since 77 years of age.   -Recent labs reviewed.  - I had a long discussion with him about the progressive nature of diabetes and the pathology behind its complications. -his diabetes is complicated by CKD stage 3 and neuropathy and he remains at a high risk for more acute and chronic complications which include CAD, CVA, CKD, retinopathy, and neuropathy. These are all discussed in detail with him.  The following Lifestyle Medicine recommendations according to American College of Lifestyle Medicine [facility]) were discussed and offered to patient and he agrees to start the journey:  A. Whole Foods, Plant-based plate comprising of fruits and vegetables, plant-based proteins, whole-grain carbohydrates was discussed in detail with the patient.   A list for source of those nutrients were also provided to the patient.  Patient will use only water or unsweetened tea for hydration. B.  The need to stay away from risky substances including alcohol, smoking; obtaining 7 to 9 hours of restorative sleep, at least 150 minutes of moderate intensity exercise weekly, the importance of healthy social connections,  and stress reduction techniques were discussed. C.  A full color page of  Calorie density of various food groups per pound  showing examples of each food groups was provided to the patient.  - Nutritional counseling repeated at each appointment due to patients tendency to fall back in to old habits.  - The patient admits there is a room for improvement in their diet and drink choices. -  Suggestion is made for the patient to avoid simple carbohydrates from their diet including Cakes, Sweet Desserts / Pastries, Ice Cream, Soda (diet and regular), Sweet Tea, Candies, Chips, Cookies, Sweet Pastries, Store Bought Juices, Alcohol in Excess of 1-2 drinks a day, Artificial Sweeteners, Coffee Creamer, and "Sugar-free" Products. This will help patient to have stable blood glucose profile and potentially avoid unintended weight gain.   - I encouraged the patient to switch to unprocessed or minimally processed complex starch and increased protein intake (animal or plant source), fruits, and vegetables.   - Patient is advised to stick to a routine mealtimes to eat 3 meals a  day and avoid unnecessary snacks (to snack only to correct hypoglycemia).  - I have approached him with the following individualized plan to manage his diabetes and patient agrees:   - Given his advanced age, avoiding hypoglycemia is a top priority for him.  As such, an appropriate A1c target for him would be around 7-7.5%.   -He is advised to continue Glipizide to 5 mg po twice daily with meals.  We have the opportunity to lower further on future visits (has refill being delivered soon of the 10 mg tablets of which he is taking half). -he is encouraged to continue monitoring glucose twice daily, before breakfast and before bed, and to call the clinic if he has readings less than 70 or above 300 for 3 tests in a row.  - Adjustment parameters are given to him for hypo and hyperglycemia in writing.  - he is not a candidate for Metformin due to concurrent renal insufficiency.  He was previously on Jardiance but kidney function actually got worse on this  medication, thus it was discontinued.  - he will be considered for incretin therapy as appropriate next visit.  - Specific targets for  A1c; LDL, HDL, and Triglycerides were discussed with the patient.  2) Blood Pressure /Hypertension:  his blood pressure is controlled to target.   he is advised to continue his current medications including Enalapril 20 mg p.o. daily with breakfast and Hydralazine 50 mg po twice daily.  3) Lipids/Hyperlipidemia:    Review of his recent lipid panel from 03/30/22 showed controlled LDL at 62.  He is on Praluent 150 mg injections every 2 weeks, has had adverse reactions to both statin and Zetia in the past.   4)  Weight/Diet:  his Body mass index is 31.17 kg/m.  -  clearly complicating his diabetes care.   he is a candidate for weight loss. I discussed with him the fact that loss of 5 - 10% of his  current body weight will have the most impact on his diabetes management.  Exercise, and detailed carbohydrates information provided  -  detailed on discharge instructions.  5) Hypothyroidism-unspecified Patient currently on Levothyroxine 25 mcg po daily before breakfast.  He was recently started on Amiodarone for Afib during recent hospitalization.  His repeat thyroid labs show slight over-activity.  I did instruct him to stop his Levothyroxine for now and will repeat his thyroid labs in 1 month for surveillance- will call them and review results and plans moving forward once labs return.  Plan is to do cardiac ablation to correct the Afib in which he would no longer need Amiodarone.  6) Chronic Care/Health Maintenance: -he on ACEI/ARB and Praluent medications and is encouraged to initiate and continue to follow up with Ophthalmology, Dentist, Podiatrist at least yearly or according to recommendations, and advised to stay away from smoking. I have recommended yearly flu vaccine and pneumonia vaccine at least every 5 years; moderate intensity exercise for up to 150 minutes  weekly; and sleep for at least 7 hours a day.  - he is advised to maintain close follow up with Tommie Sams, DO for primary care needs, as well as his other providers for optimal and coordinated care.     I spent  40  minutes in the care of the patient today including review of labs from CMP, Lipids, Thyroid Function, Hematology (current and previous including abstractions from other facilities); face-to-face time discussing  his blood glucose readings/logs, discussing hypoglycemia and hyperglycemia episodes  and symptoms, medications doses, his options of short and long term treatment based on the latest standards of care / guidelines;  discussion about incorporating lifestyle medicine;  and documenting the encounter. Risk reduction counseling performed per USPSTF guidelines to reduce obesity and cardiovascular risk factors.     Please refer to Patient Instructions for Blood Glucose Monitoring and Insulin/Medications Dosing Guide"  in media tab for additional information. Please  also refer to " Patient Self Inventory" in the Media  tab for reviewed elements of pertinent patient history.  Kyle Dunlap participated in the discussions, expressed understanding, and voiced agreement with the above plans.  All questions were answered to his satisfaction. he is encouraged to contact clinic should he have any questions or concerns prior to his return visit.     Follow up plan: - Return in about 4 months (around 09/24/2022), or will get thyroid labs in 1 month for follow up- will call them with results, for Diabetes F/U with A1c in office, Bring meter and logs.   Ronny Bacon, [facility] [address] [place], [place] [address] Phone: [phone] Fax: [phone]  05/24/2022, 4:15 PM

## 2022-05-24 NOTE — Patient Instructions (Signed)
Please get thyroid labs repeated around 06/24/22.  I will reach out with the results and plans moving forward.

## 2022-05-28 ENCOUNTER — Encounter: Payer: Self-pay | Admitting: Cardiology

## 2022-05-28 ENCOUNTER — Ambulatory Visit: Payer: Medicare Other | Attending: Cardiology | Admitting: Cardiology

## 2022-05-28 VITALS — BP 146/80 | HR 70 | Ht 68.5 in | Wt 209.0 lb

## 2022-05-28 DIAGNOSIS — I480 Paroxysmal atrial fibrillation: Secondary | ICD-10-CM

## 2022-05-28 DIAGNOSIS — D6869 Other thrombophilia: Secondary | ICD-10-CM | POA: Diagnosis not present

## 2022-05-28 DIAGNOSIS — I471 Supraventricular tachycardia, unspecified: Secondary | ICD-10-CM | POA: Diagnosis not present

## 2022-05-28 DIAGNOSIS — I2510 Atherosclerotic heart disease of native coronary artery without angina pectoris: Secondary | ICD-10-CM | POA: Diagnosis not present

## 2022-05-28 MED ORDER — METOPROLOL SUCCINATE ER 100 MG PO TB24: 100.0000 mg | ORAL_TABLET | Freq: Every day | ORAL | 6 refills | Status: DC

## 2022-05-28 NOTE — Patient Instructions (Addendum)
Medication Instructions:  Your physician has recommended you make the following change in your medication: STOP Carvedilol (Coreg) START Metoprolol Succinate (Toprol) 100 mg daily at bedtime  *If you need a refill on your cardiac medications before your next appointment, please call your pharmacy*   Lab Work: Pre procedure labs -- see procedure instruction letter:  BMP & CBC  If you have labs (blood work) drawn today and your tests are completely normal, you will receive your results only by: MyChart Message (if you have MyChart) OR A paper copy in the mail If you have any lab test that is abnormal or we need to change your treatment, we will call you to review the results.   Testing/Procedures: Your physician has requested that you have cardiac CT within 7 days PRIOR to your ablation. Cardiac computed tomography (CT) is a painless test that uses an x-ray machine to take clear, detailed pictures of your heart.  Please follow instruction below located under "other instructions". You will get a call from our office to schedule the date for this test.  Your physician has recommended that you have an ablation. Catheter ablation is a medical procedure used to treat some cardiac arrhythmias (irregular heartbeats). During catheter ablation, a long, thin, flexible tube is put into a blood vessel in your groin (upper thigh), or neck. This tube is called an ablation catheter. It is then guided to your heart through the blood vessel. Radio frequency waves destroy small areas of heart tissue where abnormal heartbeats may cause an arrhythmia to start.   EP scheduler, April, will call to schedule this procedure after August.  It may be several weeks before you hear from her.   Follow-Up: At [facility], you and your health needs are our priority.  As part of our continuing mission to provide you with exceptional heart care, we have created designated Provider Care Teams.  These Care Teams include your  primary Cardiologist (physician) and Advanced Practice Providers (APPs -  Physician Assistants and Nurse Practitioners) who all work together to provide you with the care you need, when you need it.  Your next appointment:   1 month(s) after your ablation  The format for your next appointment:   In Person  Provider:   AFib clinic   Thank you for choosing [facility]!!   Dory Horn, RN [phone]    Other Instructions   Cardiac Ablation Cardiac ablation is a procedure to destroy (ablate) some heart tissue that is sending bad signals. These bad signals cause problems in heart rhythm. The heart has many areas that make these signals. If there are problems in these areas, they can make the heart beat in a way that is not normal. Destroying some tissues can help make the heart rhythm normal. Tell your doctor about: Any allergies you have. All medicines you are taking. These include vitamins, herbs, eye drops, creams, and over-the-counter medicines. Any problems you or family members have had with medicines that make you fall asleep (anesthetics). Any blood disorders you have. Any surgeries you have had. Any medical conditions you have, such as kidney failure. Whether you are pregnant or may be pregnant. What are the risks? This is a safe procedure. But problems may occur, including: Infection. Bruising and bleeding. Bleeding into the chest. Stroke or blood clots. Damage to nearby areas of your body. Allergies to medicines or dyes. The need for a pacemaker if the normal system is damaged. Failure of the procedure to treat the problem.  What happens before the procedure? Medicines Ask your doctor about: Changing or stopping your normal medicines. This is important. Taking aspirin and ibuprofen. Do not take these medicines unless your doctor tells you to take them. Taking other medicines, vitamins, herbs, and supplements. General instructions Follow instructions  from your doctor about what you cannot eat or drink. Plan to have someone take you home from the hospital or clinic. If you will be going home right after the procedure, plan to have someone with you for 24 hours. Ask your doctor what steps will be taken to prevent infection. What happens during the procedure?  An IV tube will be put into one of your veins. You will be given a medicine to help you relax. The skin on your neck or groin will be numbed. A cut (incision) will be made in your neck or groin. A needle will be put through your cut and into a large vein. A tube (catheter) will be put into the needle. The tube will be moved to your heart. Dye may be put through the tube. This helps your doctor see your heart. Small devices (electrodes) on the tube will send out signals. A type of energy will be used to destroy some heart tissue. The tube will be taken out. Pressure will be held on your cut. This helps stop bleeding. A bandage will be put over your cut. The exact procedure may vary among doctors and hospitals. What happens after the procedure? You will be watched until you leave the hospital or clinic. This includes checking your heart rate, breathing rate, oxygen, and blood pressure. Your cut will be watched for bleeding. You will need to lie still for a few hours. Do not drive for 24 hours or as long as your doctor tells you. Summary Cardiac ablation is a procedure to destroy some heart tissue. This is done to treat heart rhythm problems. Tell your doctor about any medical conditions you may have. Tell him or her about all medicines you are taking to treat them. This is a safe procedure. But problems may occur. These include infection, bruising, bleeding, and damage to nearby areas of your body. Follow what your doctor tells you about food and drink. You may also be told to change or stop some of your medicines. After the procedure, do not drive for 24 hours or as long as your  doctor tells you. This information is not intended to replace advice given to you by your health care provider. Make sure you discuss any questions you have with your health care provider. Document Revised: 03/31/2021 Document Reviewed: 12/11/2018 Elsevier Patient Education  2023 [employer].

## 2022-05-28 NOTE — Progress Notes (Signed)
Electrophysiology Office Note   Date:  05/28/2022   ID:  JUDEA FANG, DOB 29-Oct-1945, MRN [id number]  PCP:  Tommie Sams, DO  Cardiologist:  Eden Emms Primary Electrophysiologist:  Neeti Knudtson Jorja Loa, MD    Chief Complaint: AF/SVT   History of Present Illness: JERMYN HOSCHAR is a 77 y.o. male who is being seen today for the evaluation of AF/SVT at the request of Tommie Sams, DO. Presenting today for electrophysiology evaluation.  He has a history seen for atrial fibrillation, SVT, hyperlipidemia, type 2 diabetes, coronary artery disease.  He was admitted to the hospital 04/09/2022 for chest pressure and shortness of breath.  He was having runs of SVT.  He had a catheterization without a target lesion.  Echo showed a normal ejection fraction.  He converted on diltiazem.  He then went into atrial fibrillation.  He was started on amiodarone as a bridge to ablation.  Since ablation he has done well.  He has intermittent short episodes of palpitations that last a few seconds at a time.  Otherwise he has no acute complaints.  Today, he denies symptoms of palpitations, chest pain, shortness of breath, orthopnea, PND, lower extremity edema, claudication, dizziness, presyncope, syncope, bleeding, or neurologic sequela. The patient is tolerating medications without difficulties.    Past Medical History:  Diagnosis Date   Adenomatous colon polyp 11/22/2008   Dr. Jena Gauss   Allergy    Carotid artery stenosis    Chronic back pain    CKD (chronic kidney disease)    DDD (degenerative disc disease), lumbar    GERD (gastroesophageal reflux disease)    Heart murmur 2023   Hemorrhoids, external    High cholesterol    History of kidney stones 2013   HTN (hypertension)    Hyperplastic colon polyp 11/22/2008   Dr. Jena Gauss   Hypothyroidism    Kidney stone    "14 years ago" per pt   Meniere's disease    Meniere's disease    Pneumonia 1965   PTSD (post-traumatic stress disorder)     Tachyarrhythmia    Type 2 diabetes mellitus (HCC)    Past Surgical History:  Procedure Laterality Date   CATARACT EXTRACTION W/PHACO Left 09/28/2016   Procedure: CATARACT EXTRACTION PHACO AND INTRAOCULAR LENS PLACEMENT (IOC);  Surgeon: Fabio Pierce, MD;  Location: [facility];  Service: Ophthalmology;  Laterality: Left;  CDE: 4.40   CATARACT EXTRACTION W/PHACO Right 10/26/2016   Procedure: CATARACT EXTRACTION PHACO AND INTRAOCULAR LENS PLACEMENT RIGHT EYE;  Surgeon: Fabio Pierce, MD;  Location: [facility];  Service: Ophthalmology;  Laterality: Right;  CDE: 6.74   CHOLECYSTECTOMY     CHOLECYSTECTOMY     COLONOSCOPY  12/10/2008   Dr. Jena Gauss- L side diverticula, adenomatous polyp, hyperplastic polyp   COLONOSCOPY  11/16/2003   Dr. Karilyn Cota- performed exam to cecum,3 polyps- no path report available, external hemorrhoids,    COLONOSCOPY N/A 01/04/2014   Procedure: COLONOSCOPY;  Surgeon: Corbin Ade, MD;  Location: [facility];  Service: Endoscopy;  Laterality: N/A;  8:30am   COLONOSCOPY N/A 02/20/2017   Surgeon: Corbin Ade, MD; multiple colon polyps ranging 3 to 9 mm in size.  Pathology with tubular adenomas.  Recommended repeat in 3 years.   COLONOSCOPY WITH PROPOFOL N/A 10/03/2020   Procedure: COLONOSCOPY WITH PROPOFOL;  Surgeon: Corbin Ade, MD;  Location: [facility];  Service: Endoscopy;  Laterality: N/A;  8:30am   ENDARTERECTOMY Left 11/20/2021   Procedure: LEFT CAROTID ENDARTERECTOMY;  Surgeon:  Victorino Sparrow, MD;  Location[facility];  Service: Vascular;  Laterality: Left;   ESOPHAGOGASTRODUODENOSCOPY  08/31/2011   RUE:[id number] esophageal erosions consistent with mild erosive reflux esophagitis   LEFT HEART CATH AND CORONARY ANGIOGRAPHY N/A 04/09/2022   Procedure: LEFT HEART CATH AND CORONARY ANGIOGRAPHY;  Surgeon: Marykay Lex, MD;  Location: [facility] INVASIVE CV LAB;  Service: Cardiovascular;  Laterality: N/A;   MOUTH SURGERY     POLYPECTOMY  10/03/2020   Procedure: POLYPECTOMY;   Surgeon: Corbin Ade, MD;  Location: [facility];  Service: Endoscopy;;     Current Outpatient Medications  Medication Sig Dispense Refill   acetaminophen (TYLENOL) 325 MG tablet Take 650 mg by mouth every 6 (six) hours as needed for moderate pain.     albuterol (VENTOLIN HFA) 108 (90 Base) MCG/ACT inhaler TAKE 2 PUFFS BY MOUTH EVERY 6 HOURS AS NEEDED FOR WHEEZE OR SHORTNESS OF BREATH (Patient taking differently: Inhale 1 puff into the lungs every 6 (six) hours as needed for shortness of breath.) 18 g 6   Alirocumab (PRALUENT) 150 MG/ML SOAJ INJECT 1 PEN INTO THE SKIN EVERY 14 (FOURTEEN) DAYS. 6 mL 3   amiodarone (PACERONE) 200 MG tablet Take one tablet twice daily for 6 days then take 1 tablet daily starting 04/19/22 60 tablet 7   amLODipine (NORVASC) 10 MG tablet Take 1 tablet (10 mg total) by mouth daily. 30 tablet 11   apixaban (ELIQUIS) 5 MG TABS tablet Take 1 tablet (5 mg total) by mouth 2 (two) times daily. 60 tablet 11   Ascorbic Acid (VITAMIN C) 1000 MG tablet Take 1,000 mg by mouth 3 (three) times a week.     aspirin EC 81 MG tablet Take 1 tablet (81 mg total) by mouth daily. Swallow whole. 90 tablet 3   b complex vitamins capsule Take 1 capsule by mouth once a week.     Cholecalciferol (VITAMIN D3) 125 MCG (5000 UT) CAPS Take 5,000 Units by mouth 2 (two) times a week.     diazepam (VALIUM) 2 MG tablet Take 1 tablet (2 mg total) by mouth every 8 (eight) hours as needed for anxiety (For dizziness secondary to meniere's disease). 90 tablet 0   diclofenac Sodium (VOLTAREN) 1 % GEL Apply 2 g topically daily as needed (pain).     DULoxetine (CYMBALTA) 60 MG capsule Take 1 capsule (60 mg total) by mouth daily. 90 capsule 1   furosemide (LASIX) 40 MG tablet Take 1 tablet (40 mg total) by mouth daily as needed for weight gain of 3 pounds or more in 24 hours or 5 pounds in one week (Patient taking differently: Take 40 mg by mouth daily as needed. One half tablet (20 mg) twice daily.) 30  tablet 11   glipiZIDE (GLUCOTROL) 5 MG tablet Take 1 tablet (5 mg total) by mouth 2 (two) times daily before a meal. 360 tablet 1   hydrALAZINE (APRESOLINE) 25 MG tablet Take 3 tablets (75 mg total) by mouth 3 (three) times daily. 270 tablet 11   metoprolol succinate (TOPROL-XL) 100 MG 24 hr tablet Take 1 tablet (100 mg total) by mouth daily. Take with or immediately following a meal. 30 tablet 6   Multiple Vitamin (MULTIVITAMIN WITH MINERALS) TABS Take 1 tablet by mouth daily.     pantoprazole (PROTONIX) 40 MG tablet TAKE 1 TABLET BY MOUTH EVERY DAY (Patient taking differently: Take 40 mg by mouth daily.) 90 tablet 1   sodium chloride (OCEAN) 0.65 % SOLN nasal  spray Place 1 spray into both nostrils at bedtime.     timolol (BETIMOL) 0.5 % ophthalmic solution Place 1 drop into both eyes daily.     traMADol (ULTRAM) 50 MG tablet Take 1 tablet (50 mg total) by mouth every 6 (six) hours as needed for moderate pain.     No current facility-administered medications for this visit.    Allergies:   Atorvastatin, Zetia [ezetimibe], Zocor [simvastatin], and Codeine   Social History:  The patient  reports that he quit smoking about 39 years ago. His smoking use included cigarettes. He has a 48.00 pack-year smoking history. He has never used smokeless tobacco. He reports current alcohol use. He reports that he does not use drugs.   Family History:  The patient's family history includes Alzheimer's disease in his brother, brother, and brother; Ankylosing spondylitis in his child; Colon cancer (age of onset: 56) in his brother; Heart attack in his father; Lung cancer in his mother; Thyroid nodules in his child.    ROS:  Please see the history of present illness.   Otherwise, review of systems is positive for none.   All other systems are reviewed and negative.    PHYSICAL EXAM: VS:  BP (!) 146/80   Pulse 70   Ht 5' 8.5" (1.74 m)   Wt 209 lb (94.8 kg)   SpO2 97%   BMI 31.32 kg/m  , BMI Body mass index  is 31.32 kg/m. GEN: Well nourished, well developed, in no acute distress  HEENT: normal  Neck: no JVD, carotid bruits, or masses Cardiac: RRR; no murmurs, rubs, or gallops,no edema  Respiratory:  clear to auscultation bilaterally, normal work of breathing GI: soft, nontender, nondistended, + BS MS: no deformity or atrophy  Skin: warm and dry Neuro:  Strength and sensation are intact Psych: euthymic mood, full affect  EKG:  EKG is not ordered today. Personal review of the ekg ordered 04/10/22 shows atrial fibrillation  Recent Labs: 04/09/2022: B Natriuretic Peptide 180.0 04/10/2022: Hemoglobin 9.5; Magnesium 2.2; Platelets 243 04/27/2022: ALT 26; BUN 29; Creatinine, Ser 1.64; Potassium 4.6; Sodium 135; TSH 3.660    Lipid Panel     Component Value Date/Time   CHOL 128 04/09/2022 0304   CHOL 203 (H) 08/11/2021 1352   TRIG 120 04/09/2022 0304   HDL 63 04/09/2022 0304   HDL 39 (L) 08/11/2021 1352   CHOLHDL 2.0 04/09/2022 0304   VLDL 24 04/09/2022 0304   LDLCALC 41 04/09/2022 0304   LDLCALC 121 (H) 08/11/2021 1352     Wt Readings from Last 3 Encounters:  05/28/22 209 lb (94.8 kg)  05/24/22 208 lb (94.3 kg)  05/14/22 209 lb 9.6 oz (95.1 kg)      Other studies Reviewed: Additional studies/ records that were reviewed today include: TTE 04/09/22  Review of the above records today demonstrates:   1. Left ventricular ejection fraction, by estimation, is 70 to 75%. The  left ventricle has hyperdynamic function. Left ventricular endocardial  border not optimally defined to evaluate regional wall motion. Left  ventricular diastolic parameters are  indeterminate.   2. Right ventricular systolic function is normal. The right ventricular  size is normal.   3. Left atrial size was moderately dilated.   4. The mitral valve is normal in structure. Trivial mitral valve  regurgitation. No evidence of mitral stenosis.   5. The aortic valve is tricuspid. Aortic valve regurgitation is not   visualized. No aortic stenosis is present.   Left heart catheterization  04/09/2022   Mid LM to Dist LM lesion is 35% stenosed.   Prox RCA lesion is 45% stenosed.   LVEDP of 23 mmHg.   There is no aortic valve stenosis.  ASSESSMENT AND PLAN:  1.  Paroxysmal atrial fibrillation: Currently on amiodarone and Eliquis CHA2DS2-VASc of 5.  Sharnee Douglass prefer an alternative rhythm control strategy as he does not like the idea of long-term side effects on amiodarone.  Due to that, we Ayden Apodaca plan for ablation.  Risk and benefits have been discussed.  He understands the risks and is agreed to the procedure.  Risk, benefits, and alternatives to EP study and radiofrequency ablation for afib were also discussed in detail today. These risks include but are not limited to stroke, bleeding, vascular damage, tamponade, perforation, damage to the esophagus, lungs, and other structures, pulmonary vein stenosis, worsening renal function, and death. The patient understands these risk and wishes to proceed.  We Brenee Gajda therefore proceed with catheter ablation at the next available time.  Carto, ICE, anesthesia are requested for the procedure.  Areal Cochrane also obtain CT PV protocol prior to the procedure to exclude LAA thrombus and further evaluate atrial anatomy.   2.  SVT: Currently on amiodarone.  No further episodes.  Myrtle Haller plan for full EP study at the time of ablation.  3.  Coronary artery disease: No current chest pain.  Plan per primary cardiology.  4.  Secondary hypercoagulable state: Currently on Eliquis for atrial fibrillation.  Current medicines are reviewed at length with the patient today.   The patient does not have concerns regarding his medicines.  The following changes were made today:  none  Labs/ tests ordered today include:  No orders of the defined types were placed in this encounter.    Disposition:   FU with Decie Verne 3 months  Signed, Joan Herschberger Jorja Loa, MD  05/28/2022 3:44 PM     [facility]  [facility] [address] [place] [address] [phone] (office) [phone] (fax)

## 2022-05-31 ENCOUNTER — Ambulatory Visit: Payer: Medicare Other | Admitting: Family Medicine

## 2022-05-31 DIAGNOSIS — I471 Supraventricular tachycardia, unspecified: Secondary | ICD-10-CM | POA: Diagnosis not present

## 2022-05-31 DIAGNOSIS — E1121 Type 2 diabetes mellitus with diabetic nephropathy: Secondary | ICD-10-CM | POA: Diagnosis not present

## 2022-05-31 DIAGNOSIS — E785 Hyperlipidemia, unspecified: Secondary | ICD-10-CM

## 2022-05-31 DIAGNOSIS — I10 Essential (primary) hypertension: Secondary | ICD-10-CM | POA: Diagnosis not present

## 2022-05-31 DIAGNOSIS — Z7984 Long term (current) use of oral hypoglycemic drugs: Secondary | ICD-10-CM

## 2022-05-31 MED ORDER — REPATHA SURECLICK 140 MG/ML ~~LOC~~ SOAJ: 140.0000 mg | SUBCUTANEOUS | 3 refills | Status: DC

## 2022-05-31 NOTE — Patient Instructions (Signed)
Stay active.  Follow up in 3 months.  Take care  Dr. Adriana Simas

## 2022-06-01 NOTE — Assessment & Plan Note (Addendum)
A1c at goal on glipizide.  Continue.

## 2022-06-01 NOTE — Assessment & Plan Note (Signed)
At goal on Norvasc, metoprolol, and hydralazine.

## 2022-06-01 NOTE — Assessment & Plan Note (Signed)
Currently stable on metoprolol and amiodarone.  Has upcoming ablation. Metoprolol may be contributing to fatigue.

## 2022-06-01 NOTE — Assessment & Plan Note (Signed)
Patient states that his pharmacy is no longer caring Praluent.  Starting Repatha.

## 2022-06-01 NOTE — Progress Notes (Signed)
Subjective:  Patient ID: Kyle Dunlap, male    DOB: [birth year]  Age: 77 y.o. MRN: [id number]  CC: Chief Complaint  Patient presents with   Diabetes    Check up Had recent hospitalization for Afib and SVT     HPI:  77 year old male with an extensive past medical history presents for follow-up.  Patient recently had hospital admission for SVT.  During admission he had chest pain and had cardiac catheterization which revealed minimal coronary artery disease.   He was started on amiodarone.  He states that overall he is doing okay.  He is having some occasional palpitations which requires use of vagal maneuver.  He is on metoprolol at amiodarone.  Patient reports that his main issue is fatigue. Wife states that he sleeps a lot during the day.    No recent chest pain or SOB.  BP well controlled. A1C at goal.  Patient Active Problem List   Diagnosis Date Noted   Paroxysmal SVT (supraventricular tachycardia) 04/08/2022   Intention tremor 04/08/2022   Anemia, unspecified 04/08/2022   PVD (peripheral vascular disease) (HCC) 04/08/2022   Cogwheel rigidity 12/04/2021   Carotid artery stenosis 11/20/2021   Generalized anxiety disorder 10/26/2021   Hypothyroidism 10/26/2021   Type 2 diabetes mellitus with diabetic nephropathy, without long-term current use of insulin (HCC) 11/15/2020   Stage 3a chronic kidney disease (HCC) 11/15/2020   Chronic right-sided low back pain without sciatica 11/15/2020   Diabetic neuropathy (HCC) 11/15/2020   Statin myopathy 05/09/2020   Aortic atherosclerosis (HCC) 05/06/2020   Meniere's disease 03/20/2015   Hyperlipidemia LDL goal <70 07/15/2012   Essential hypertension, benign 07/15/2012   GERD (gastroesophageal reflux disease) 08/03/2011    Social Hx   Social History   Socioeconomic History   Marital status: Married    Spouse name: Olegario Messier   Number of children: 2   Years of education: Not on file   Highest education level: GED or  equivalent  Occupational History   Occupation: retired    Comment: Personnel officer  Tobacco Use   Smoking status: Former    Packs/day: 2.00    Years: 24.00    Additional pack years: 0.00    Total pack years: 48.00    Types: Cigarettes    Quit date: 1985    Years since quitting: 39.3   Smokeless tobacco: Never  Vaping Use   Vaping Use: Never used  Substance and Sexual Activity   Alcohol use: Yes    Comment: occasionally- 4-5 mixed drinks per week.   Drug use: No   Sexual activity: Yes    Birth control/protection: None  Other Topics Concern   Not on file  Social History Narrative   Right handed    Retired   Chemical engineer Strain: Low Risk  (05/31/2022)   Overall Financial Resource Strain (CARDIA)    Difficulty of Paying Living Expenses: Not hard at all  Food Insecurity: No Food Insecurity (05/31/2022)   Hunger Vital Sign    Worried About Running Out of Food in the Last Year: Never true    Ran Out of Food in the Last Year: Never true  Transportation Needs: No Transportation Needs (05/31/2022)   PRAPARE - Administrator, Civil Service (Medical): No    Lack of Transportation (Non-Medical): No  Physical Activity: Inactive (05/31/2022)   Exercise Vital Sign    Days of Exercise per Week: 0 days    Minutes of Exercise per Session:  0 min  Stress: No Stress Concern Present (05/31/2022)   [employer] of Occupational Health - Occupational Stress Questionnaire    Feeling of Stress : Only a little  Social Connections: Unknown (05/31/2022)   Social Connection and Isolation Panel [NHANES]    Frequency of Communication with Friends and Family: Once a week    Frequency of Social Gatherings with Friends and Family: Patient declined    Attends Religious Services: More than 4 times per year    Active Member of [employer] or Organizations: No    Attends Banker Meetings: Never    Marital Status: Married    Review of Systems Per  HPI  Objective:  BP 136/68   Ht 5' 8.5" (1.74 m)   Wt 208 lb 9.6 oz (94.6 kg)   BMI 31.26 kg/m      05/31/2022    2:36 PM 05/28/2022    2:51 PM 05/24/2022    3:35 PM  BP/Weight  Systolic BP 136 146 136  Diastolic BP 68 80 60  Wt. (Lbs) 208.6 209 208  BMI 31.26 kg/m2 31.32 kg/m2 31.17 kg/m2    Physical Exam Vitals and nursing note reviewed.  Constitutional:      General: He is not in acute distress.    Appearance: Normal appearance.  HENT:     Head: Normocephalic and atraumatic.  Eyes:     General:        Right eye: No discharge.        Left eye: No discharge.     Conjunctiva/sclera: Conjunctivae normal.  Cardiovascular:     Rate and Rhythm: Normal rate and regular rhythm.  Pulmonary:     Effort: Pulmonary effort is normal.     Breath sounds: Normal breath sounds. No wheezing, rhonchi or rales.  Neurological:     Mental Status: He is alert.  Psychiatric:        Mood and Affect: Mood normal.        Behavior: Behavior normal.     Lab Results  Component Value Date   WBC 7.6 04/10/2022   HGB 9.5 (L) 04/10/2022   HCT 27.9 (L) 04/10/2022   PLT 243 04/10/2022   GLUCOSE 179 (H) 04/27/2022   CHOL 128 04/09/2022   TRIG 120 04/09/2022   HDL 63 04/09/2022   LDLCALC 41 04/09/2022   ALT 26 04/27/2022   AST 23 04/27/2022   NA 135 04/27/2022   K 4.6 04/27/2022   CL 96 04/27/2022   CREATININE 1.64 (H) 04/27/2022   BUN 29 (H) 04/27/2022   CO2 22 04/27/2022   TSH 3.660 04/27/2022   PSA 1.92 03/15/2014   INR 1.1 11/14/2021   HGBA1C 6.9 (H) 04/08/2022   MICROALBUR 60.2 (H) 03/15/2014     Assessment & Plan:   Problem List Items Addressed This Visit       Cardiovascular and Mediastinum   Essential hypertension, benign    At goal on Norvasc, metoprolol, and hydralazine.      Relevant Medications   Evolocumab (REPATHA SURECLICK) 140 MG/ML SOAJ   Paroxysmal SVT (supraventricular tachycardia)    Currently stable on metoprolol and amiodarone.  Has upcoming  ablation. Metoprolol may be contributing to fatigue.      Relevant Medications   Evolocumab (REPATHA SURECLICK) 140 MG/ML SOAJ     Endocrine   Type 2 diabetes mellitus with diabetic nephropathy, without long-term current use of insulin (HCC)    A1c at goal on glipizide.  Continue.  Other   Hyperlipidemia LDL goal <70    Patient states that his pharmacy is no longer caring Praluent.  Starting Repatha.      Relevant Medications   Evolocumab (REPATHA SURECLICK) 140 MG/ML SOAJ    Meds ordered this encounter  Medications   Evolocumab (REPATHA SURECLICK) 140 MG/ML SOAJ    Sig: Inject 140 mg into the skin every 14 (fourteen) days.    Dispense:  6 mL    Refill:  3    Follow-up:  Return in about 3 months (around 08/31/2022).  Everlene Other DO [facility]

## 2022-06-05 ENCOUNTER — Encounter: Payer: Self-pay | Admitting: Cardiovascular Disease

## 2022-06-07 ENCOUNTER — Encounter: Payer: Self-pay | Admitting: Family Medicine

## 2022-06-07 ENCOUNTER — Telehealth: Payer: Self-pay | Admitting: Pharmacist

## 2022-06-07 NOTE — Telephone Encounter (Signed)
REPATHA APPROVED :) ROUTING TO PCP FOR FYI I wasn't sure of office protocol for PA approvals Please put in [id number] to pharmacy if patient unable to afford

## 2022-06-08 NOTE — Telephone Encounter (Signed)
Nurses Swelling in the feet could be related more so to increase fluid retention which could be related to the heart or the kidneys. Metoprolol in a very small number of cases can contribute to swelling in the feet but that is not a common side effect I would recommend a office visit with Dr. Adriana Simas next week Dr. Adriana Simas is out of the office today, Eber Jones is the only provider in the office today currently her schedule is full, if their situation gets worse-if feeling short of breath or other difficulties-over the weekend they may need to go to urgent care or ER but otherwise I would recommend seeing Dr. Adriana Simas next week for further evaluation

## 2022-06-11 NOTE — Telephone Encounter (Signed)
Contacted patient and scheduled per drs notes and recommendations.

## 2022-06-12 ENCOUNTER — Encounter: Payer: Self-pay | Admitting: *Deleted

## 2022-06-14 ENCOUNTER — Ambulatory Visit: Payer: Medicare Other | Admitting: Family Medicine

## 2022-06-14 ENCOUNTER — Encounter: Payer: Self-pay | Admitting: Family Medicine

## 2022-06-14 VITALS — BP 156/71 | HR 70 | Temp 97.9°F | Ht 68.5 in | Wt 209.0 lb

## 2022-06-14 DIAGNOSIS — I10 Essential (primary) hypertension: Secondary | ICD-10-CM | POA: Diagnosis not present

## 2022-06-14 NOTE — Patient Instructions (Signed)
Stop the amlodipine.  I will be in touch once I get a hold of cardiology.  Take care  Dr. Adriana Simas

## 2022-06-18 NOTE — Progress Notes (Signed)
Subjective:  Patient ID: Kyle Dunlap, male    DOB: [birth year]  Age: 77 y.o. MRN: [id number]  CC: Chief Complaint  Patient presents with   foot and ankle swelling    Going on for a while now worse in the past 2 weeks    HPI:  77 year old male with an extensive PMH presents for evaluation of the above.   Recently started on Norvasc. Now having lower extremity edema. No significant pain. SOB at baseline. No other symptoms at this time.   Patient Active Problem List   Diagnosis Date Noted   Paroxysmal SVT (supraventricular tachycardia) 04/08/2022   Intention tremor 04/08/2022   Anemia, unspecified 04/08/2022   PVD (peripheral vascular disease) (HCC) 04/08/2022   Cogwheel rigidity 12/04/2021   Carotid artery stenosis 11/20/2021   Generalized anxiety disorder 10/26/2021   Hypothyroidism 10/26/2021   Type 2 diabetes mellitus with diabetic nephropathy, without long-term current use of insulin (HCC) 11/15/2020   Stage 3a chronic kidney disease (HCC) 11/15/2020   Chronic right-sided low back pain without sciatica 11/15/2020   Diabetic neuropathy (HCC) 11/15/2020   Statin myopathy 05/09/2020   Aortic atherosclerosis (HCC) 05/06/2020   Meniere's disease 03/20/2015   Hyperlipidemia LDL goal <70 07/15/2012   Essential hypertension, benign 07/15/2012   GERD (gastroesophageal reflux disease) 08/03/2011    Social Hx   Social History   Socioeconomic History   Marital status: Married    Spouse name: Olegario Messier   Number of children: 2   Years of education: Not on file   Highest education level: GED or equivalent  Occupational History   Occupation: retired    Comment: Personnel officer  Tobacco Use   Smoking status: Former    Packs/day: 2.00    Years: 24.00    Additional pack years: 0.00    Total pack years: 48.00    Types: Cigarettes    Quit date: 1985    Years since quitting: 39.4   Smokeless tobacco: Never  Vaping Use   Vaping Use: Never used  Substance and Sexual Activity    Alcohol use: Yes    Comment: occasionally- 4-5 mixed drinks per week.   Drug use: No   Sexual activity: Yes    Birth control/protection: None  Other Topics Concern   Not on file  Social History Narrative   Right handed    Retired   Chemical engineer Strain: Low Risk  (05/31/2022)   Overall Financial Resource Strain (CARDIA)    Difficulty of Paying Living Expenses: Not hard at all  Food Insecurity: No Food Insecurity (05/31/2022)   Hunger Vital Sign    Worried About Running Out of Food in the Last Year: Never true    Ran Out of Food in the Last Year: Never true  Transportation Needs: No Transportation Needs (05/31/2022)   PRAPARE - Administrator, Civil Service (Medical): No    Lack of Transportation (Non-Medical): No  Physical Activity: Inactive (05/31/2022)   Exercise Vital Sign    Days of Exercise per Week: 0 days    Minutes of Exercise per Session: 0 min  Stress: No Stress Concern Present (05/31/2022)   [employer] of Occupational Health - Occupational Stress Questionnaire    Feeling of Stress : Only a little  Social Connections: Unknown (05/31/2022)   Social Connection and Isolation Panel [NHANES]    Frequency of Communication with Friends and Family: Once a week    Frequency of Social Gatherings with  Friends and Family: Patient declined    Attends Religious Services: More than 4 times per year    Active Member of [employer] or Organizations: No    Attends Engineer, structural: Never    Marital Status: Married    Review of Systems Per HPI  Objective:  BP (!) 156/71   Pulse 70   Temp 97.9 F (36.6 C)   Ht 5' 8.5" (1.74 m)   Wt 209 lb (94.8 kg)   SpO2 96%   BMI 31.32 kg/m      06/14/2022    3:54 PM 06/14/2022    3:37 PM 05/31/2022    2:36 PM  BP/Weight  Systolic BP 156 175 136  Diastolic BP 71 75 68  Wt. (Lbs)  209 208.6  BMI  31.32 kg/m2 31.26 kg/m2    Physical Exam Vitals and nursing note reviewed.   Constitutional:      General: He is not in acute distress.    Appearance: Normal appearance.  HENT:     Head: Normocephalic and atraumatic.  Cardiovascular:     Rate and Rhythm: Normal rate.  Pulmonary:     Effort: Pulmonary effort is normal. No respiratory distress.     Breath sounds: No wheezing or rales.  Neurological:     Mental Status: He is alert.  Psychiatric:     Comments: Flat affect.    Lab Results  Component Value Date   WBC 7.6 04/10/2022   HGB 9.5 (L) 04/10/2022   HCT 27.9 (L) 04/10/2022   PLT 243 04/10/2022   GLUCOSE 179 (H) 04/27/2022   CHOL 128 04/09/2022   TRIG 120 04/09/2022   HDL 63 04/09/2022   LDLCALC 41 04/09/2022   ALT 26 04/27/2022   AST 23 04/27/2022   NA 135 04/27/2022   K 4.6 04/27/2022   CL 96 04/27/2022   CREATININE 1.64 (H) 04/27/2022   BUN 29 (H) 04/27/2022   CO2 22 04/27/2022   TSH 3.660 04/27/2022   PSA 1.92 03/15/2014   INR 1.1 11/14/2021   HGBA1C 6.9 (H) 04/08/2022   MICROALBUR 60.2 (H) 03/15/2014     Assessment & Plan:   Problem List Items Addressed This Visit       Cardiovascular and Mediastinum   Essential hypertension, benign - Primary    Side effects from Amlodipine.  Planning on stopping but would like specialist input regarding meds. Currently on Hydralazine and Metoprolol. ACE previously stopped by Nephrology.   I have sent a message to cardiology. Awaiting response.       Everlene Other DO [facility]

## 2022-06-18 NOTE — Assessment & Plan Note (Signed)
Side effects from Amlodipine.  Planning on stopping but would like specialist input regarding meds. Currently on Hydralazine and Metoprolol. ACE previously stopped by Nephrology.   I have sent a message to cardiology. Awaiting response.

## 2022-06-26 ENCOUNTER — Telehealth: Payer: Self-pay | Admitting: *Deleted

## 2022-06-26 LAB — TSH: TSH: 7.37 u[IU]/mL — ABNORMAL HIGH (ref 0.450–4.500)

## 2022-06-26 LAB — T4, FREE: Free T4: 1.48 ng/dL (ref 0.82–1.77)

## 2022-06-26 NOTE — Telephone Encounter (Signed)
-----   Message from Dani Gobble, NP sent at 06/26/2022  7:42 AM EDT ----- Please call patient/wife and let them know the thyroid labs look better since stopping the Levothyroxine.  TSH is now slightly high, but when I look at the 2 values, the Free T4 is the most important and it is now right in the middle where we want it .  It was likely that the Amiodarone contributed to his recent thyroid dysfunction and once he gets off of it (hopefully after the ablation), we will continue to monitor thyroid function and put him back on the Levothyroxine if needed.

## 2022-06-26 NOTE — Progress Notes (Signed)
Please call patient/wife and let them know the thyroid labs look better since stopping the Levothyroxine.  TSH is now slightly high, but when I look at the 2 values, the Free T4 is the most important and it is now right in the middle where we want it.  It was likely that the Amiodarone contributed to his recent thyroid dysfunction and once he gets off of it (hopefully after the ablation), we will continue to monitor thyroid function and put him back on the Levothyroxine if needed.

## 2022-06-26 NOTE — Telephone Encounter (Signed)
Both the patient and his wife were talked to and given the results per [facility] instruction.

## 2022-07-17 ENCOUNTER — Other Ambulatory Visit: Payer: Self-pay

## 2022-07-17 ENCOUNTER — Encounter: Payer: Self-pay | Admitting: Cardiovascular Disease

## 2022-07-17 ENCOUNTER — Ambulatory Visit (HOSPITAL_COMMUNITY)
Admission: RE | Admit: 2022-07-17 | Discharge: 2022-07-17 | Disposition: A | Discharge status: Home / Self Care | Payer: Medicare Other | Source: Ambulatory Visit | Attending: Cardiovascular Disease | Admitting: Cardiovascular Disease

## 2022-07-17 ENCOUNTER — Other Ambulatory Visit (HOSPITAL_COMMUNITY)
Admission: RE | Admit: 2022-07-17 | Discharge: 2022-07-17 | Disposition: A | Discharge status: Home / Self Care | Payer: Medicare Other | Source: Ambulatory Visit | Attending: Cardiovascular Disease | Admitting: Cardiovascular Disease

## 2022-07-17 DIAGNOSIS — R0609 Other forms of dyspnea: Secondary | ICD-10-CM | POA: Diagnosis present

## 2022-07-17 DIAGNOSIS — I480 Paroxysmal atrial fibrillation: Secondary | ICD-10-CM

## 2022-07-17 LAB — BASIC METABOLIC PANEL
Anion gap: 10 (ref 5–15)
BUN: 24 mg/dL — ABNORMAL HIGH (ref 8–23)
CO2: 25 mmol/L (ref 22–32)
Calcium: 8.6 mg/dL — ABNORMAL LOW (ref 8.9–10.3)
Chloride: 99 mmol/L (ref 98–111)
Creatinine, Ser: 1.66 mg/dL — ABNORMAL HIGH (ref 0.61–1.24)
GFR, Estimated: 42 mL/min — ABNORMAL LOW (ref >60)
Glucose, Bld: 223 mg/dL — ABNORMAL HIGH (ref 70–99)
Potassium: 3.5 mmol/L (ref 3.5–5.1)
Sodium: 134 mmol/L — ABNORMAL LOW (ref 135–145)

## 2022-07-17 LAB — BRAIN NATRIURETIC PEPTIDE: B Natriuretic Peptide: 226 pg/mL — ABNORMAL HIGH (ref 0.0–100.0)

## 2022-07-19 ENCOUNTER — Encounter: Payer: Self-pay | Admitting: Cardiovascular Disease

## 2022-07-19 ENCOUNTER — Other Ambulatory Visit: Payer: Self-pay | Admitting: *Deleted

## 2022-07-19 MED ORDER — APIXABAN 5 MG PO TABS: 5.0000 mg | ORAL_TABLET | Freq: Two times a day (BID) | ORAL | 5 refills | Status: DC

## 2022-07-19 NOTE — Telephone Encounter (Signed)
Prescription refill request for Eliquis received. Indication: PAF Last office visit: 05/28/22  Carleene Mains MD Scr: 1.66 on 07/17/22 Age: 77 Weight: 94.8kg  Based on above findings Eliquis 5mg  twice daily is the appropriate dose.  Refill approved.

## 2022-07-27 ENCOUNTER — Other Ambulatory Visit (HOSPITAL_COMMUNITY)
Admission: RE | Admit: 2022-07-27 | Discharge: 2022-07-27 | Disposition: A | Discharge status: Home / Self Care | Payer: Medicare Other | Source: Ambulatory Visit | Attending: Cardiovascular Disease | Admitting: Cardiovascular Disease

## 2022-07-27 ENCOUNTER — Telehealth: Payer: Self-pay

## 2022-07-27 DIAGNOSIS — R0602 Shortness of breath: Secondary | ICD-10-CM | POA: Diagnosis present

## 2022-07-27 LAB — BRAIN NATRIURETIC PEPTIDE: B Natriuretic Peptide: 210 pg/mL — ABNORMAL HIGH (ref 0.0–100.0)

## 2022-07-27 NOTE — Telephone Encounter (Signed)
-----   Message from Ethelda Chick, RN sent at 07/23/2022  8:39 AM EDT -----  ----- Message ----- From: Wendall Stade, MD Sent: 07/23/2022   7:38 AM EDT To: Ethelda Chick, RN  CXR not helpful distinguishing lung issue from heart Check BNP and non contrast chest CT

## 2022-07-27 NOTE — Telephone Encounter (Signed)
Per DPR- patient asleep, so I spoke with wife and discussed cxr results.  Patient will get BNP drawn at [facility] and will have non-contrast chest CT

## 2022-08-02 ENCOUNTER — Other Ambulatory Visit: Payer: Self-pay

## 2022-08-02 DIAGNOSIS — I480 Paroxysmal atrial fibrillation: Secondary | ICD-10-CM

## 2022-08-04 ENCOUNTER — Encounter: Payer: Self-pay | Admitting: Cardiovascular Disease

## 2022-08-06 ENCOUNTER — Other Ambulatory Visit: Payer: Self-pay

## 2022-08-06 MED ORDER — AMIODARONE HCL 200 MG PO TABS: ORAL_TABLET | ORAL | 7 refills | Status: DC

## 2022-08-21 ENCOUNTER — Other Ambulatory Visit: Payer: Self-pay | Admitting: Family Medicine

## 2022-08-21 DIAGNOSIS — H8103 Meniere's disease, bilateral: Secondary | ICD-10-CM

## 2022-08-31 ENCOUNTER — Ambulatory Visit: Payer: Medicare Other | Admitting: Family Medicine

## 2022-09-04 ENCOUNTER — Ambulatory Visit (HOSPITAL_COMMUNITY)
Admission: RE | Admit: 2022-09-04 | Discharge: 2022-09-04 | Disposition: A | Discharge status: Home / Self Care | Payer: Medicare Other | Source: Ambulatory Visit | Attending: Vascular Surgery | Admitting: Vascular Surgery

## 2022-09-04 ENCOUNTER — Ambulatory Visit (INDEPENDENT_AMBULATORY_CARE_PROVIDER_SITE_OTHER): Payer: Medicare Other | Admitting: Vascular Surgery

## 2022-09-04 ENCOUNTER — Encounter: Payer: Self-pay | Admitting: Vascular Surgery

## 2022-09-04 VITALS — BP 185/77 | HR 65 | Temp 98.4°F | Resp 18 | Ht 68.5 in | Wt 210.0 lb

## 2022-09-04 DIAGNOSIS — I6523 Occlusion and stenosis of bilateral carotid arteries: Secondary | ICD-10-CM | POA: Diagnosis present

## 2022-09-04 NOTE — Progress Notes (Signed)
Patient name: Kyle Dunlap MRN: [id number] DOB: 11-Feb-1945 Sex: male  REASON FOR CONSULT: 53-month follow-up, carotid artery surveillance  HPI: Kyle Dunlap is a 77 y.o. male, who presents for 64-month follow-up for ongoing carotid surveillance.  He underwent left carotid endarterectomy for an asymptomatic high-grade stenosis on 11/20/2021 by Dr. Karin Lieu.  In the PACU he was found to have right facial droop, dysarthria and right arm weakness.  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  He ultimately was back to his baseline prior to discharge.  No new concerns today.  No new neurologic events since last follow-up.  He is on Eliquis for atrial fibrillation as well as aspirin and Repatha.  Cannot tolerate statins.  Past Medical History:  Diagnosis Date   Adenomatous colon polyp 11/22/2008   Dr. Jena Gauss   Allergy    Carotid artery stenosis    Chronic back pain    CKD (chronic kidney disease)    DDD (degenerative disc disease), lumbar    GERD (gastroesophageal reflux disease)    Heart murmur 2023   Hemorrhoids, external    High cholesterol    History of kidney stones 2013   HTN (hypertension)    Hyperplastic colon polyp 11/22/2008   Dr. Jena Gauss   Hypothyroidism    Kidney stone    "14 years ago" per pt   Meniere's disease    Meniere's disease    Pneumonia 1965   PTSD (post-traumatic stress disorder)    Tachyarrhythmia    Type 2 diabetes mellitus (HCC)     Past Surgical History:  Procedure Laterality Date   CATARACT EXTRACTION W/PHACO Left 09/28/2016   Procedure: CATARACT EXTRACTION PHACO AND INTRAOCULAR LENS PLACEMENT (IOC);  Surgeon: Fabio Pierce, MD;  Location: [facility];  Service: Ophthalmology;  Laterality: Left;  CDE: 4.40   CATARACT EXTRACTION W/PHACO Right 10/26/2016   Procedure: CATARACT EXTRACTION PHACO AND INTRAOCULAR LENS PLACEMENT RIGHT EYE;  Surgeon: Fabio Pierce, MD;  Location: [facility];  Service: Ophthalmology;  Laterality:  Right;  CDE: 6.74   CHOLECYSTECTOMY     CHOLECYSTECTOMY     COLONOSCOPY  12/10/2008   Dr. Jena Gauss- L side diverticula, adenomatous polyp, hyperplastic polyp   COLONOSCOPY  11/16/2003   Dr. Karilyn Cota- performed exam to cecum,3 polyps- no path report available, external hemorrhoids,    COLONOSCOPY N/A 01/04/2014   Procedure: COLONOSCOPY;  Surgeon: Corbin Ade, MD;  Location: [facility];  Service: Endoscopy;  Laterality: N/A;  8:30am   COLONOSCOPY N/A 02/20/2017   Surgeon: Corbin Ade, MD; multiple colon polyps ranging 3 to 9 mm in size.  Pathology with tubular adenomas.  Recommended repeat in 3 years.   COLONOSCOPY WITH PROPOFOL N/A 10/03/2020   Procedure: COLONOSCOPY WITH PROPOFOL;  Surgeon: Corbin Ade, MD;  Location: [facility];  Service: Endoscopy;  Laterality: N/A;  8:30am   ENDARTERECTOMY Left 11/20/2021   Procedure: LEFT CAROTID ENDARTERECTOMY;  Surgeon: Victorino Sparrow, MD;  Location: [facility] OR;  Service: Vascular;  Laterality: Left;   ESOPHAGOGASTRODUODENOSCOPY  08/31/2011   XBM:[id number] esophageal erosions consistent with mild erosive reflux esophagitis   LEFT HEART CATH AND CORONARY ANGIOGRAPHY N/A 04/09/2022   Procedure: LEFT HEART CATH AND CORONARY ANGIOGRAPHY;  Surgeon: Marykay Lex, MD;  Location: [facility] INVASIVE CV LAB;  Service: Cardiovascular;  Laterality: N/A;   MOUTH SURGERY     POLYPECTOMY  10/03/2020   Procedure: POLYPECTOMY;  Surgeon: Corbin Ade, MD;  Location: [facility];  Service: Endoscopy;;    Family History  Problem Relation Age of Onset   Lung cancer Mother    Heart attack Father    Colon cancer Brother 58   Alzheimer's disease Brother    Alzheimer's disease Brother    Alzheimer's disease Brother    Ankylosing spondylitis Child    Thyroid nodules Child     SOCIAL HISTORY: Social History   Socioeconomic History   Marital status: Married    Spouse name: Olegario Messier   Number of children: 2   Years of education: Not on file   Highest  education level: GED or equivalent  Occupational History   Occupation: retired    Comment: Personnel officer  Tobacco Use   Smoking status: Former    Current packs/day: 0.00    Average packs/day: 2.0 packs/day for 24.0 years (48.0 ttl pk-yrs)    Types: Cigarettes    Start date: 14    Quit date: 1985    Years since quitting: 39.6   Smokeless tobacco: Never  Vaping Use   Vaping status: Never Used  Substance and Sexual Activity   Alcohol use: Yes    Comment: occasionally- 4-5 mixed drinks per week.   Drug use: No   Sexual activity: Yes    Birth control/protection: None  Other Topics Concern   Not on file  Social History Narrative   Right handed    Retired   Chemical engineer Strain: Low Risk  (05/31/2022)   Overall Financial Resource Strain (CARDIA)    Difficulty of Paying Living Expenses: Not hard at all  Food Insecurity: No Food Insecurity (05/31/2022)   Hunger Vital Sign    Worried About Running Out of Food in the Last Year: Never true    Ran Out of Food in the Last Year: Never true  Transportation Needs: No Transportation Needs (05/31/2022)   PRAPARE - Administrator, Civil Service (Medical): No    Lack of Transportation (Non-Medical): No  Physical Activity: Inactive (05/31/2022)   Exercise Vital Sign    Days of Exercise per Week: 0 days    Minutes of Exercise per Session: 0 min  Stress: No Stress Concern Present (05/31/2022)   [employer] of Occupational Health - Occupational Stress Questionnaire    Feeling of Stress : Only a little  Social Connections: Unknown (05/31/2022)   Social Connection and Isolation Panel [NHANES]    Frequency of Communication with Friends and Family: Once a week    Frequency of Social Gatherings with Friends and Family: Patient declined    Attends Religious Services: More than 4 times per year    Active Member of [employer] or Organizations: No    Attends Banker Meetings: Never    Marital  Status: Married  Catering manager Violence: Not At Risk (04/08/2022)   Humiliation, Afraid, Rape, and Kick questionnaire    Fear of Current or Ex-Partner: No    Emotionally Abused: No    Physically Abused: No    Sexually Abused: No    Allergies  Allergen Reactions   Atorvastatin Other (See Comments)    Joint and muscle aches, weakness present   Zetia [Ezetimibe] Other (See Comments)    Elevated liver enzymes   Zocor [Simvastatin] Other (See Comments)    Joint and muscle aches   Codeine Other (See Comments)    Passed out    Current Outpatient Medications  Medication Sig Dispense Refill   acetaminophen (TYLENOL) 325 MG tablet  Take 650 mg by mouth every 6 (six) hours as needed for moderate pain.     albuterol (VENTOLIN HFA) 108 (90 Base) MCG/ACT inhaler TAKE 2 PUFFS BY MOUTH EVERY 6 HOURS AS NEEDED FOR WHEEZE OR SHORTNESS OF BREATH (Patient taking differently: Inhale 1 puff into the lungs every 6 (six) hours as needed for shortness of breath.) 18 g 6   amiodarone (PACERONE) 200 MG tablet Take one tablet twice daily for 6 days then take 1 tablet daily starting 04/19/22 60 tablet 7   amLODipine (NORVASC) 10 MG tablet Take 1 tablet (10 mg total) by mouth daily. 30 tablet 11   apixaban (ELIQUIS) 5 MG TABS tablet Take 1 tablet (5 mg total) by mouth 2 (two) times daily. 60 tablet 5   Ascorbic Acid (VITAMIN C) 1000 MG tablet Take 1,000 mg by mouth 3 (three) times a week.     aspirin EC 81 MG tablet Take 1 tablet (81 mg total) by mouth daily. Swallow whole. 90 tablet 3   b complex vitamins capsule Take 1 capsule by mouth once a week.     Cholecalciferol (VITAMIN D3) 125 MCG (5000 UT) CAPS Take 5,000 Units by mouth 2 (two) times a week.     diazepam (VALIUM) 2 MG tablet Take 1 tablet (2 mg total) by mouth every 8 (eight) hours as needed for anxiety (For dizziness secondary to meniere's disease). 90 tablet 0   diclofenac Sodium (VOLTAREN) 1 % GEL Apply 2 g topically daily as needed (pain).      DULoxetine (CYMBALTA) 60 MG capsule Take 1 capsule (60 mg total) by mouth daily. 90 capsule 1   Evolocumab (REPATHA SURECLICK) 140 MG/ML SOAJ Inject 140 mg into the skin every 14 (fourteen) days. 6 mL 3   furosemide (LASIX) 40 MG tablet Take 1 tablet (40 mg total) by mouth daily as needed for weight gain of 3 pounds or more in 24 hours or 5 pounds in one week (Patient taking differently: Take 40 mg by mouth daily as needed. One half tablet (20 mg) twice daily.) 30 tablet 11   glipiZIDE (GLUCOTROL) 5 MG tablet Take 1 tablet (5 mg total) by mouth 2 (two) times daily before a meal. 360 tablet 1   hydrALAZINE (APRESOLINE) 25 MG tablet Take 3 tablets (75 mg total) by mouth 3 (three) times daily. 270 tablet 11   metoprolol succinate (TOPROL-XL) 100 MG 24 hr tablet Take 1 tablet (100 mg total) by mouth daily. Take with or immediately following a meal. 30 tablet 6   Multiple Vitamin (MULTIVITAMIN WITH MINERALS) TABS Take 1 tablet by mouth daily.     pantoprazole (PROTONIX) 40 MG tablet TAKE 1 TABLET BY MOUTH EVERY DAY (Patient taking differently: Take 40 mg by mouth daily.) 90 tablet 1   sodium chloride (OCEAN) 0.65 % SOLN nasal spray Place 1 spray into both nostrils at bedtime.     timolol (BETIMOL) 0.5 % ophthalmic solution Place 1 drop into both eyes daily.     traMADol (ULTRAM) 50 MG tablet Take 1 tablet (50 mg total) by mouth every 6 (six) hours as needed for moderate pain.     No current facility-administered medications for this visit.    REVIEW OF SYSTEMS:  [X]  denotes positive finding, [ ]  denotes negative finding Cardiac  Comments:  Chest pain or chest pressure:    Shortness of breath upon exertion:    Short of breath when lying flat:    Irregular heart rhythm:  Vascular    Pain in calf, thigh, or hip brought on by ambulation:    Pain in feet at night that wakes you up from your sleep:     Blood clot in your veins:    Leg swelling:         Pulmonary    Oxygen at home:     Productive cough:     Wheezing:         Neurologic    Sudden weakness in arms or legs:     Sudden numbness in arms or legs:     Sudden onset of difficulty speaking or slurred speech:    Temporary loss of vision in one eye:     Problems with dizziness:         Gastrointestinal    Blood in stool:     Vomited blood:         Genitourinary    Burning when urinating:     Blood in urine:        Psychiatric    Major depression:         Hematologic    Bleeding problems:    Problems with blood clotting too easily:        Skin    Rashes or ulcers:        Constitutional    Fever or chills:      PHYSICAL EXAM: Vitals:   09/04/22 1322 09/04/22 1323 09/04/22 1325  BP: (!) 204/65 (!) 188/79 (!) 185/77  Pulse: 65    Resp: 18    Temp: 98.4 F (36.9 C)    TempSrc: Temporal    SpO2: 96%    Weight: 210 lb (95.3 kg)    Height: 5' 8.5" (1.74 m)      GENERAL: The patient is a well-nourished male, in no acute distress. The vital signs are documented above. CARDIAC: There is a regular rate and rhythm.  VASCULAR:  Left neck incision well healed PULMONARY: No respiratory distress. ABDOMEN: Soft and non-tender. MUSCULOSKELETAL: There are no major deformities or cyanosis. NEUROLOGIC: No focal weakness or paresthesias are detected.  CN II-XII grossly intact.   SKIN: There are no ulcers or rashes noted. PSYCHIATRIC: The patient has a normal affect.  DATA:   Carotid duplex shows minimal 1 to 39% stenosis bilaterally with a patent left carotid endarterectomy site  Assessment/Plan:   77 y.o. male, who presents for 46-month follow-up for ongoing carotid surveillance.  He underwent left carotid endarterectomy for an asymptomatic high-grade stenosis on 11/20/2021 by Dr. Karin Lieu.  Postop he had a TIA following surgery but ultimately was back to his baseline prior to hospital discharge.  Discussed his left carotid endarterectomy site looks patent on duplex today.  He has minimal 1 to 39%  stenosis on the right.  Will continue surveillance with another carotid duplex in 1 year with follow-up in our office.  He is on aspirin for risk reduction.  Cannot tolerate statins but is on Repatha.   Cephus Shelling, MD [facility] Office: [phone]

## 2022-09-06 ENCOUNTER — Ambulatory Visit (HOSPITAL_COMMUNITY)
Admission: RE | Admit: 2022-09-06 | Discharge: 2022-09-06 | Disposition: A | Discharge status: Home / Self Care | Payer: Medicare Other | Source: Ambulatory Visit | Attending: Cardiovascular Disease | Admitting: Cardiovascular Disease

## 2022-09-06 DIAGNOSIS — R0602 Shortness of breath: Secondary | ICD-10-CM | POA: Diagnosis present

## 2022-09-07 ENCOUNTER — Other Ambulatory Visit: Payer: Self-pay

## 2022-09-07 DIAGNOSIS — I6523 Occlusion and stenosis of bilateral carotid arteries: Secondary | ICD-10-CM

## 2022-09-13 ENCOUNTER — Other Ambulatory Visit: Payer: Self-pay | Admitting: Nurse Practitioner

## 2022-09-13 ENCOUNTER — Encounter: Payer: Self-pay | Admitting: Cardiology

## 2022-09-13 ENCOUNTER — Encounter: Payer: Self-pay | Admitting: Nurse Practitioner

## 2022-09-13 DIAGNOSIS — E039 Hypothyroidism, unspecified: Secondary | ICD-10-CM

## 2022-09-14 ENCOUNTER — Encounter (HOSPITAL_COMMUNITY): Payer: Self-pay

## 2022-09-17 ENCOUNTER — Other Ambulatory Visit: Payer: Self-pay

## 2022-09-17 DIAGNOSIS — I480 Paroxysmal atrial fibrillation: Secondary | ICD-10-CM

## 2022-09-17 NOTE — Telephone Encounter (Signed)
Called pt to confirm he was aware to proceed with CT tomorrow and ablation next week.

## 2022-09-18 ENCOUNTER — Ambulatory Visit (HOSPITAL_COMMUNITY)
Admission: RE | Admit: 2022-09-18 | Discharge: 2022-09-18 | Disposition: A | Discharge status: Home / Self Care | Payer: Medicare Other | Source: Ambulatory Visit | Attending: Cardiology | Admitting: Cardiology

## 2022-09-18 DIAGNOSIS — I480 Paroxysmal atrial fibrillation: Secondary | ICD-10-CM | POA: Diagnosis present

## 2022-09-18 LAB — BASIC METABOLIC PANEL
BUN/Creatinine Ratio: 13 (ref 10–24)
BUN: 22 mg/dL (ref 8–27)
CO2: 24 mmol/L (ref 20–29)
Calcium: 9.1 mg/dL (ref 8.6–10.2)
Chloride: 96 mmol/L (ref 96–106)
Creatinine, Ser: 1.68 mg/dL — ABNORMAL HIGH (ref 0.76–1.27)
Glucose: 283 mg/dL — ABNORMAL HIGH (ref 70–99)
Potassium: 4.2 mmol/L (ref 3.5–5.2)
Sodium: 137 mmol/L (ref 134–144)
eGFR: 41 mL/min/{1.73_m2} — ABNORMAL LOW (ref >59)

## 2022-09-18 LAB — CBC
Hematocrit: 35.3 % — ABNORMAL LOW (ref 37.5–51.0)
Hemoglobin: 11.2 g/dL — ABNORMAL LOW (ref 13.0–17.7)
MCH: 27.3 pg (ref 26.6–33.0)
MCHC: 31.7 g/dL (ref 31.5–35.7)
MCV: 86 fL (ref 79–97)
Platelets: 368 10*3/uL (ref 150–450)
RBC: 4.1 x10E6/uL — ABNORMAL LOW (ref 4.14–5.80)
RDW: 14.2 % (ref 11.6–15.4)
WBC: 7.5 10*3/uL (ref 3.4–10.8)

## 2022-09-18 MED ORDER — IOHEXOL 350 MG/ML SOLN
95.0000 mL | Freq: Once | INTRAVENOUS | Status: AC | PRN
  Administered 2022-09-18: 95 mL via INTRAVENOUS

## 2022-09-20 LAB — T4, FREE: Free T4: 1.49 ng/dL (ref 0.82–1.77)

## 2022-09-20 LAB — TSH: TSH: 3.82 u[IU]/mL (ref 0.450–4.500)

## 2022-09-24 ENCOUNTER — Encounter (HOSPITAL_COMMUNITY): Payer: Self-pay | Admitting: Anesthesiology

## 2022-09-25 ENCOUNTER — Telehealth: Payer: Self-pay | Admitting: Cardiology

## 2022-09-25 ENCOUNTER — Ambulatory Visit: Payer: Medicare Other | Admitting: Nurse Practitioner

## 2022-09-25 ENCOUNTER — Encounter: Payer: Self-pay | Admitting: Family Medicine

## 2022-09-25 NOTE — Telephone Encounter (Signed)
Procedure has been cancelled.

## 2022-09-25 NOTE — Telephone Encounter (Signed)
Pt's called call to cancel scheduled Ablation and make provider aware that pt passed on 10-15-22. Please advise

## 2022-09-26 ENCOUNTER — Encounter (HOSPITAL_COMMUNITY): Admission: RE | Payer: Self-pay | Source: Ambulatory Visit

## 2022-09-26 ENCOUNTER — Ambulatory Visit (HOSPITAL_COMMUNITY): Admission: RE | Admit: 2022-09-26 | Payer: Medicare Other | Source: Ambulatory Visit | Admitting: Cardiology

## 2022-09-26 SURGERY — ATRIAL FIBRILLATION ABLATION
Anesthesia: General

## 2022-09-26 NOTE — Telephone Encounter (Signed)
Tommie Sams, DO     So sorry for your loss.  I filled out the death certificate with Hypertension and CKD as contributing factors. He likely had a vascular event.  If there is anything I can do, please let me know.  Everlene Other DO [facility]

## 2022-09-27 ENCOUNTER — Ambulatory Visit: Payer: Medicare Other | Admitting: Neurology

## 2022-10-23 DEATH — deceased
